# Patient Record
Sex: Male | Born: 1960 | Race: Asian | Hispanic: No | Marital: Married | Smoking: Former smoker
Health system: Southern US, Community
[De-identification: ages and names within clinical notes are randomized; demographics above are authoritative.]

## PROBLEM LIST (undated history)

## (undated) DIAGNOSIS — I10 Essential (primary) hypertension: Secondary | ICD-10-CM

## (undated) DIAGNOSIS — I610 Nontraumatic intracerebral hemorrhage in hemisphere, subcortical: Secondary | ICD-10-CM

## (undated) DIAGNOSIS — E119 Type 2 diabetes mellitus without complications: Secondary | ICD-10-CM

## (undated) HISTORY — PX: NECK SURGERY: SHX720

## (undated) HISTORY — DX: Nontraumatic intracerebral hemorrhage in hemisphere, subcortical: I61.0

## (undated) HISTORY — DX: Type 2 diabetes mellitus without complications: E11.9

---

## 2001-03-27 ENCOUNTER — Encounter: Payer: Self-pay | Admitting: Surgery

## 2001-03-27 ENCOUNTER — Encounter: Admission: RE | Admit: 2001-03-27 | Discharge: 2001-03-27 | Payer: Self-pay | Admitting: Surgery

## 2001-03-29 ENCOUNTER — Ambulatory Visit (HOSPITAL_BASED_OUTPATIENT_CLINIC_OR_DEPARTMENT_OTHER): Admission: RE | Admit: 2001-03-29 | Discharge: 2001-03-29 | Payer: Self-pay | Admitting: Surgery

## 2018-05-07 ENCOUNTER — Ambulatory Visit (HOSPITAL_COMMUNITY)
Admission: EM | Admit: 2018-05-07 | Discharge: 2018-05-07 | Disposition: A | Discharge status: Home / Self Care | Payer: PRIVATE HEALTH INSURANCE | Attending: Family Medicine | Admitting: Family Medicine

## 2018-05-07 ENCOUNTER — Encounter (HOSPITAL_COMMUNITY): Payer: Self-pay | Admitting: Emergency Medicine

## 2018-05-07 DIAGNOSIS — M7989 Other specified soft tissue disorders: Secondary | ICD-10-CM

## 2018-05-07 MED ORDER — DIPHENHYDRAMINE HCL 25 MG PO CAPS
ORAL_CAPSULE | ORAL | Status: AC
  Filled 2018-05-07: qty 4

## 2018-05-07 MED ORDER — DIPHENHYDRAMINE HCL 25 MG PO CAPS
50.0000 mg | ORAL_CAPSULE | Freq: Once | ORAL | Status: AC
  Administered 2018-05-07: 50 mg via ORAL

## 2018-05-07 NOTE — Discharge Instructions (Addendum)
Elevate hand until swelling has resolved Use ice pack every 3-4 hours as long as swollen

## 2018-05-07 NOTE — ED Provider Notes (Signed)
[facility]    CSN: [id number] Arrival date & time: 05/07/18  1839     History   Chief Complaint Chief Complaint  Patient presents with  . Hand Swelling    HPI Mark Bass is a 57 y.o. male.   Patient has swelling of the left hand since yesterday.  He thinks he slept on it.  No known stings or bites.  Denies joint symptoms but is unable to fully flex or extend due to swelling.  HPI  History reviewed. No pertinent past medical history.  There are no active problems to display for this patient.   History reviewed. No pertinent surgical history.     Home Medications    Prior to Admission medications   Not on File    Family History No family history on file.  Social History Social History   Tobacco Use  . Smoking status: Not on file  Substance Use Topics  . Alcohol use: Not on file  . Drug use: Not on file     Allergies   Patient has no known allergies.   Review of Systems Review of Systems  Constitutional: Negative.  Negative for chills and fever.  HENT: Negative for ear pain and sore throat.   Eyes: Negative for pain and visual disturbance.  Respiratory: Negative for cough and shortness of breath.   Cardiovascular: Negative for chest pain and palpitations.  Gastrointestinal: Negative for abdominal pain and vomiting.  Genitourinary: Negative for dysuria and hematuria.  Musculoskeletal: Positive for arthralgias. Negative for back pain.  Skin: Negative for color change and rash.  Neurological: Negative for seizures and syncope.  All other systems reviewed and are negative.    Physical Exam Triage Vital Signs ED Triage Vitals [05/07/18 1929]  Enc Vitals Group     BP (!) 197/109     Pulse Rate 72     Resp 16     Temp 98.2 F (36.8 C)     Temp src      SpO2 96 %     Weight      Height      Head Circumference      Peak Flow      Pain Score      Pain Loc      Pain Edu?      Excl. in GC?    No data found.  Updated Vital  Signs BP (!) 197/109   Pulse 72   Temp 98.2 F (36.8 C)   Resp 16   SpO2 96%   Visual Acuity Right Eye Distance:   Left Eye Distance:   Bilateral Distance:    Right Eye Near:   Left Eye Near:    Bilateral Near:     Physical Exam  Constitutional: He appears well-developed and well-nourished.  Cardiovascular: Normal rate and regular rhythm.  Pulmonary/Chest: Effort normal and breath sounds normal.  Musculoskeletal:  Left hand: 3+ soft tissue swelling.  There is no increase in temperature or erythema.  No localized joint swelling or tenderness but he has decreased flexion and extension due to swelling.     UC Treatments / Results  Labs (all labs ordered are listed, but only abnormal results are displayed) Labs Reviewed - No data to display  EKG None  Radiology No results found.  Procedures Procedures (including critical care time)  Medications Ordered in UC Medications - No data to display  Initial Impression / Assessment and Plan / UC Course  I have reviewed the triage vital  signs and the nursing notes.  Pertinent labs & imaging results that were available during my care of the patient were reviewed by me and considered in my medical decision making (see chart for details).     Left hand swelling.  Will use antihistamine along with elevation and ice Final Clinical Impressions(s) / UC Diagnoses   Final diagnoses:  None   Discharge Instructions   None    ED Prescriptions    None     Controlled Substance Prescriptions [place] Controlled Substance Registry consulted? No   Frederica Kuster, MD 05/07/18 [phone]

## 2018-12-31 ENCOUNTER — Other Ambulatory Visit: Payer: Self-pay

## 2018-12-31 ENCOUNTER — Encounter (HOSPITAL_COMMUNITY): Payer: Self-pay | Admitting: *Deleted

## 2018-12-31 ENCOUNTER — Inpatient Hospital Stay (HOSPITAL_COMMUNITY)
Admission: EM | Admit: 2018-12-31 | Discharge: 2019-01-03 | DRG: 871 | Disposition: A | Discharge status: Home / Self Care | Payer: 59 | Attending: Internal Medicine | Admitting: Internal Medicine

## 2018-12-31 ENCOUNTER — Emergency Department (HOSPITAL_COMMUNITY): Payer: 59

## 2018-12-31 DIAGNOSIS — E860 Dehydration: Secondary | ICD-10-CM | POA: Diagnosis present

## 2018-12-31 DIAGNOSIS — Z87891 Personal history of nicotine dependence: Secondary | ICD-10-CM

## 2018-12-31 DIAGNOSIS — J1282 Pneumonia due to coronavirus disease 2019: Secondary | ICD-10-CM

## 2018-12-31 DIAGNOSIS — A419 Sepsis, unspecified organism: Secondary | ICD-10-CM | POA: Diagnosis present

## 2018-12-31 DIAGNOSIS — A4189 Other specified sepsis: Secondary | ICD-10-CM | POA: Diagnosis not present

## 2018-12-31 DIAGNOSIS — J1289 Other viral pneumonia: Secondary | ICD-10-CM | POA: Diagnosis present

## 2018-12-31 DIAGNOSIS — U071 COVID-19: Secondary | ICD-10-CM | POA: Diagnosis present

## 2018-12-31 DIAGNOSIS — R630 Anorexia: Secondary | ICD-10-CM | POA: Diagnosis present

## 2018-12-31 DIAGNOSIS — R030 Elevated blood-pressure reading, without diagnosis of hypertension: Secondary | ICD-10-CM | POA: Diagnosis present

## 2018-12-31 LAB — CBC WITH DIFFERENTIAL/PLATELET

## 2018-12-31 NOTE — ED Triage Notes (Signed)
Pt speaks [place] as preferred language. Triage completed with assistance by his daughter whom he lives with. Pt has had fever, body aches and cough for about a week. Pt spouse who he has been taking care of is also being evaluated in the department with similar symptoms.   Pt daughter, Joice Lofts is her primary contact at [phone]

## 2018-12-31 NOTE — ED Notes (Signed)
[facility] notified that the patient is in room 3 for xray

## 2019-01-01 ENCOUNTER — Encounter (HOSPITAL_COMMUNITY): Payer: Self-pay | Admitting: Internal Medicine

## 2019-01-01 DIAGNOSIS — R030 Elevated blood-pressure reading, without diagnosis of hypertension: Secondary | ICD-10-CM | POA: Diagnosis not present

## 2019-01-01 DIAGNOSIS — A419 Sepsis, unspecified organism: Secondary | ICD-10-CM | POA: Diagnosis not present

## 2019-01-01 DIAGNOSIS — E860 Dehydration: Secondary | ICD-10-CM

## 2019-01-01 DIAGNOSIS — U071 COVID-19: Secondary | ICD-10-CM | POA: Diagnosis present

## 2019-01-01 DIAGNOSIS — R630 Anorexia: Secondary | ICD-10-CM | POA: Diagnosis not present

## 2019-01-01 DIAGNOSIS — A4189 Other specified sepsis: Secondary | ICD-10-CM | POA: Diagnosis not present

## 2019-01-01 DIAGNOSIS — J1289 Other viral pneumonia: Secondary | ICD-10-CM

## 2019-01-01 DIAGNOSIS — Z87891 Personal history of nicotine dependence: Secondary | ICD-10-CM | POA: Diagnosis not present

## 2019-01-01 DIAGNOSIS — J1282 Pneumonia due to coronavirus disease 2019: Secondary | ICD-10-CM | POA: Diagnosis present

## 2019-01-01 LAB — COMPREHENSIVE METABOLIC PANEL
ALT: 31 U/L (ref 0–44)
AST: 33 U/L (ref 15–41)
Albumin: 3.6 g/dL (ref 3.5–5.0)
Alkaline Phosphatase: 61 U/L (ref 38–126)
Anion gap: 10 (ref 5–15)
BUN: 22 mg/dL — ABNORMAL HIGH (ref 6–20)
CO2: 27 mmol/L (ref 22–32)
Calcium: 9 mg/dL (ref 8.9–10.3)
Chloride: 99 mmol/L (ref 98–111)
Creatinine, Ser: 1.18 mg/dL (ref 0.61–1.24)
GFR calc Af Amer: >60 mL/min (ref >60)
GFR calc non Af Amer: >60 mL/min (ref >60)
Glucose, Bld: 120 mg/dL — ABNORMAL HIGH (ref 70–99)
Potassium: 3.7 mmol/L (ref 3.5–5.1)
Sodium: 136 mmol/L (ref 135–145)
Total Bilirubin: 0.4 mg/dL (ref 0.3–1.2)
Total Protein: 8 g/dL (ref 6.5–8.1)

## 2019-01-01 LAB — D-DIMER, QUANTITATIVE: D-Dimer, Quant: 1.08 ug/mL-FEU — ABNORMAL HIGH (ref 0.00–0.50)

## 2019-01-01 LAB — CBC WITH DIFFERENTIAL/PLATELET
Abs Immature Granulocytes: 0.04 10*3/uL (ref 0.00–0.07)
Basophils Absolute: 0 10*3/uL (ref 0.0–0.1)
Basophils Relative: 0 %
Eosinophils Absolute: 0 10*3/uL (ref 0.0–0.5)
Eosinophils Relative: 0 %
HCT: 46 % (ref 39.0–52.0)
Hemoglobin: 14.3 g/dL (ref 13.0–17.0)
Immature Granulocytes: 0 %
Lymphocytes Relative: 15 %
Lymphs Abs: 1.6 10*3/uL (ref 0.7–4.0)
MCH: 20.8 pg — ABNORMAL LOW (ref 26.0–34.0)
MCHC: 31.1 g/dL (ref 30.0–36.0)
MCV: 67 fL — ABNORMAL LOW (ref 80.0–100.0)
Monocytes Absolute: 0.8 10*3/uL (ref 0.1–1.0)
Monocytes Relative: 8 %
Neutro Abs: 8.2 10*3/uL — ABNORMAL HIGH (ref 1.7–7.7)
Neutrophils Relative %: 77 %
Platelets: 183 10*3/uL (ref 150–400)
RBC: 6.87 MIL/uL — ABNORMAL HIGH (ref 4.22–5.81)
RDW: 17 % — ABNORMAL HIGH (ref 11.5–15.5)
WBC: 10.6 10*3/uL — ABNORMAL HIGH (ref 4.0–10.5)
nRBC: 0 % (ref 0.0–0.2)

## 2019-01-01 LAB — LACTATE DEHYDROGENASE: LDH: 222 U/L — ABNORMAL HIGH (ref 98–192)

## 2019-01-01 LAB — LACTIC ACID, PLASMA: Lactic Acid, Venous: 1 mmol/L (ref 0.5–1.9)

## 2019-01-01 LAB — FERRITIN: Ferritin: 668 ng/mL — ABNORMAL HIGH (ref 24–336)

## 2019-01-01 LAB — ABO/RH: ABO/RH(D): A POS

## 2019-01-01 LAB — PROCALCITONIN: Procalcitonin: <0.1 ng/mL

## 2019-01-01 LAB — SARS CORONAVIRUS 2 BY RT PCR (HOSPITAL ORDER, PERFORMED IN ~~LOC~~ HOSPITAL LAB): SARS Coronavirus 2: POSITIVE — AB

## 2019-01-01 LAB — C-REACTIVE PROTEIN: CRP: 5.3 mg/dL — ABNORMAL HIGH (ref <1.0)

## 2019-01-01 LAB — FIBRINOGEN: Fibrinogen: 580 mg/dL — ABNORMAL HIGH (ref 210–475)

## 2019-01-01 LAB — TRIGLYCERIDES: Triglycerides: 109 mg/dL (ref <150)

## 2019-01-01 MED ORDER — SODIUM CHLORIDE 0.9 % IV BOLUS
500.0000 mL | Freq: Once | INTRAVENOUS | Status: AC
  Administered 2019-01-01: 500 mL via INTRAVENOUS

## 2019-01-01 MED ORDER — SODIUM CHLORIDE 0.9 % IV SOLN
INTRAVENOUS | Status: DC
  Administered 2019-01-01: 11:00:00 via INTRAVENOUS

## 2019-01-01 MED ORDER — METHYLPREDNISOLONE SODIUM SUCC 40 MG IJ SOLR
40.0000 mg | Freq: Two times a day (BID) | INTRAMUSCULAR | Status: DC
  Administered 2019-01-01 – 2019-01-03 (×4): 40 mg via INTRAVENOUS
  Filled 2019-01-01 (×4): qty 1

## 2019-01-01 MED ORDER — GUAIFENESIN-DM 100-10 MG/5ML PO SYRP: 10.0000 mL | ORAL_SOLUTION | ORAL | Status: DC | PRN

## 2019-01-01 MED ORDER — ZINC SULFATE 220 (50 ZN) MG PO CAPS
220.0000 mg | ORAL_CAPSULE | Freq: Every day | ORAL | Status: DC
  Administered 2019-01-01 – 2019-01-03 (×3): 220 mg via ORAL
  Filled 2019-01-01 (×3): qty 1

## 2019-01-01 MED ORDER — ALBUTEROL SULFATE HFA 108 (90 BASE) MCG/ACT IN AERS
2.0000 | INHALATION_SPRAY | Freq: Four times a day (QID) | RESPIRATORY_TRACT | Status: DC
  Administered 2019-01-01 – 2019-01-02 (×3): 2 via RESPIRATORY_TRACT
  Filled 2019-01-01: qty 6.7

## 2019-01-01 MED ORDER — SODIUM CHLORIDE 0.9 % IV SOLN
500.0000 mg | INTRAVENOUS | Status: DC
  Administered 2019-01-01 – 2019-01-02 (×2): 500 mg via INTRAVENOUS
  Filled 2019-01-01 (×2): qty 500

## 2019-01-01 MED ORDER — HYDROCOD POLST-CPM POLST ER 10-8 MG/5ML PO SUER: 5.0000 mL | Freq: Two times a day (BID) | ORAL | Status: DC | PRN

## 2019-01-01 MED ORDER — TOCILIZUMAB 400 MG/20ML IV SOLN
8.0000 mg/kg | Freq: Once | INTRAVENOUS | Status: AC
  Administered 2019-01-01: 508 mg via INTRAVENOUS
  Filled 2019-01-01: qty 20

## 2019-01-01 MED ORDER — ONDANSETRON HCL 4 MG/2ML IJ SOLN: 4.0000 mg | Freq: Four times a day (QID) | INTRAMUSCULAR | Status: DC | PRN

## 2019-01-01 MED ORDER — ENOXAPARIN SODIUM 40 MG/0.4ML ~~LOC~~ SOLN
40.0000 mg | SUBCUTANEOUS | Status: DC
  Administered 2019-01-01 – 2019-01-02 (×2): 40 mg via SUBCUTANEOUS
  Filled 2019-01-01 (×2): qty 0.4

## 2019-01-01 MED ORDER — VITAMIN C 500 MG PO TABS
500.0000 mg | ORAL_TABLET | Freq: Every day | ORAL | Status: DC
  Administered 2019-01-02 – 2019-01-03 (×2): 500 mg via ORAL
  Filled 2019-01-01 (×2): qty 1

## 2019-01-01 MED ORDER — ACETAMINOPHEN 325 MG PO TABS: 650.0000 mg | ORAL_TABLET | Freq: Four times a day (QID) | ORAL | Status: DC | PRN

## 2019-01-01 MED ORDER — SODIUM CHLORIDE 0.9 % IV BOLUS
1000.0000 mL | Freq: Once | INTRAVENOUS | Status: AC
  Administered 2019-01-01: 1000 mL via INTRAVENOUS

## 2019-01-01 MED ORDER — SODIUM CHLORIDE 0.9 % IV SOLN
1.0000 g | INTRAVENOUS | Status: DC
  Administered 2019-01-01 – 2019-01-02 (×2): 1 g via INTRAVENOUS
  Filled 2019-01-01 (×2): qty 10

## 2019-01-01 MED ORDER — ONDANSETRON HCL 4 MG PO TABS: 4.0000 mg | ORAL_TABLET | Freq: Four times a day (QID) | ORAL | Status: DC | PRN

## 2019-01-01 NOTE — ED Notes (Signed)
Patient's wife is at [facility], room 129

## 2019-01-01 NOTE — ED Notes (Signed)
[facility] is here preparing the pt for transport to [facility] location.

## 2019-01-01 NOTE — Progress Notes (Signed)
Patient admitted earlier this morning by Dr. Katrinka Blazing  58 year old [place] speaking male admitted to the hospital with coronavirus.  Patient was interviewed through interpreter line.  Patient understands that he has been admitted to the hospital for coronavirus infection.  He has fever, infiltrate on chest x-ray, negative procalcitonin, elevated inflammatory markers.  Patient was explained the risks and benefits of Actemra as well as the experimental nature of this medication.  He is agreeable to receive 1 dose.  We will also start the patient on steroids.  Repeat labs in a.m.  He is otherwise stable at this time.  [employer]

## 2019-01-01 NOTE — ED Notes (Signed)
Lab called, pt is covid +

## 2019-01-01 NOTE — ED Provider Notes (Signed)
[name] EMERGENCY DEPARTMENT Provider Note   CSN: [id number] Arrival date & time: 12/31/18  2231    History   Chief Complaint Chief Complaint  Patient presents with  . Generalized Body Aches    HPI Mark Bass is a 58 y.o. male.     Patient presents to the emergency department with 1 week of fever, cough, body aches.  Patient's wife has similar symptoms (+ Covid-19 diagnosed during ED stay).  Patient describes generalized chest pain.  He also has sore throat.  No nausea, vomiting, or diarrhea.  No urinary symptoms.  No treatments prior to arrival.  Patient denies history of COPD, asthma, or other lung problems.  He denies smoking.  He feels mildly short of breath with activity.     History reviewed. No pertinent past medical history.  There are no active problems to display for this patient.   History reviewed. No pertinent surgical history.      Home Medications    Prior to Admission medications   Not on File    Family History No family history on file.  Social History Social History   Tobacco Use  . Smoking status: Not on file  Substance Use Topics  . Alcohol use: Not on file  . Drug use: Not on file     Allergies   Patient has no known allergies.   Review of Systems Review of Systems  Constitutional: Positive for chills, fatigue and fever.  HENT: Negative for rhinorrhea and sore throat.   Eyes: Negative for redness.  Respiratory: Positive for cough and shortness of breath.   Cardiovascular: Positive for chest pain.  Gastrointestinal: Negative for abdominal pain, diarrhea, nausea and vomiting.  Genitourinary: Negative for dysuria.  Musculoskeletal: Negative for myalgias.  Skin: Negative for rash.  Neurological: Negative for headaches.     Physical Exam Updated Vital Signs BP (!) 141/87   Pulse (!) 106   Temp 99.5 F (37.5 C) (Oral)   Resp (!) 24   Ht 5' (1.524 m)   Wt 63.5 kg   SpO2 96%   BMI 27.34 kg/m    Physical Exam Vitals signs and nursing note reviewed.  Constitutional:      Appearance: He is well-developed.  HENT:     Head: Normocephalic and atraumatic.  Eyes:     General:        Right eye: No discharge.        Left eye: No discharge.     Conjunctiva/sclera: Conjunctivae normal.  Neck:     Musculoskeletal: Normal range of motion and neck supple.  Cardiovascular:     Rate and Rhythm: Regular rhythm. Tachycardia present.     Heart sounds: Normal heart sounds.  Pulmonary:     Effort: Pulmonary effort is normal. Tachypnea present.     Breath sounds: Normal breath sounds.  Abdominal:     Palpations: Abdomen is soft.     Tenderness: There is no abdominal tenderness.  Skin:    General: Skin is warm and dry.  Neurological:     Mental Status: He is alert.      ED Treatments / Results  Labs (all labs ordered are listed, but only abnormal results are displayed) Labs Reviewed  SARS CORONAVIRUS 2 (HOSPITAL ORDER, PERFORMED IN [facility]) - Abnormal; Notable for the following components:      Result Value   SARS Coronavirus 2 POSITIVE (*)    All other components within normal limits  CBC WITH DIFFERENTIAL/PLATELET -  Abnormal; Notable for the following components:   WBC 10.6 (*)    RBC 6.87 (*)    MCV 67.0 (*)    MCH 20.8 (*)    RDW 17.0 (*)    Neutro Abs 8.2 (*)    All other components within normal limits  COMPREHENSIVE METABOLIC PANEL - Abnormal; Notable for the following components:   Glucose, Bld 120 (*)    BUN 22 (*)    All other components within normal limits  D-DIMER, QUANTITATIVE (NOT AT [facility]) - Abnormal; Notable for the following components:   D-Dimer, Quant 1.08 (*)    All other components within normal limits  LACTATE DEHYDROGENASE - Abnormal; Notable for the following components:   LDH 222 (*)    All other components within normal limits  FERRITIN - Abnormal; Notable for the following components:   Ferritin 668 (*)    All other components  within normal limits  FIBRINOGEN - Abnormal; Notable for the following components:   Fibrinogen 580 (*)    All other components within normal limits  C-REACTIVE PROTEIN - Abnormal; Notable for the following components:   CRP 5.3 (*)    All other components within normal limits  CULTURE, BLOOD (ROUTINE X 2)  CULTURE, BLOOD (ROUTINE X 2)  LACTIC ACID, PLASMA  PROCALCITONIN  TRIGLYCERIDES    ED ECG REPORT   Date: 01/01/2019  Rate: 111  Rhythm: sinus tachycardia  QRS Axis: normal  Intervals: normal  ST/T Wave abnormalities: normal  Conduction Disutrbances:none  Narrative Interpretation:   Old EKG Reviewed: none available  I have personally reviewed the EKG tracing and agree with the computerized printout as noted.   Radiology Dg Chest Port 1 View  Result Date: 12/31/2018 CLINICAL DATA:  Fever EXAM: PORTABLE CHEST 1 VIEW COMPARISON:  None. FINDINGS: There is patchy infiltrate in the right base. The lungs elsewhere are clear. Heart is borderline enlarged with pulmonary vascularity normal. No adenopathy. No bone lesions. IMPRESSION: Patchy infiltrate consistent with pneumonia right base. Lungs elsewhere clear. Heart borderline enlarged. Electronically Signed   By: Bretta BangWilliam  Woodruff III M.D.   On: 12/31/2018 23:33    Procedures Procedures (including critical care time)  Medications Ordered in ED Medications  sodium chloride 0.9 % bolus 500 mL (0 mLs Intravenous Stopped 01/01/19 0536)     Initial Impression / Assessment and Plan / ED Course  I have reviewed the triage vital signs and the nursing notes.  Pertinent labs & imaging results that were available during my care of the patient were reviewed by me and considered in my medical decision making (see chart for details).        Patient seen and examined.  Patient looks mildly uncomfortable.  We will give small fluid bolus, expand lab work-up.  Chest x-ray reviewed.  Vital signs reviewed and are as follows: BP (!)  141/87   Pulse (!) 106   Temp 99.5 F (37.5 C) (Oral)   Resp (!) 24   Ht 5' (1.524 m)   Wt 63.5 kg   SpO2 96%   BMI 27.34 kg/m   6:14 AM patient discussed with and seen by Dr. Manus Gunningancour.   Patient remains tachycardic, tachypneic.  Will discuss with hospitalist for admission.  Spoke with Dr. Toniann FailKakrakandy. AM hospitalist team will see.   BP (!) 149/90   Pulse (!) 113   Temp (S) 100.1 F (37.8 C)   Resp (!) 30   Ht 5' (1.524 m)   Wt 63.5 kg   SpO2  96%   BMI 27.34 kg/m   Final Clinical Impressions(s) / ED Diagnoses   Final diagnoses:  Pneumonia due to COVID-19 virus   Admit.   ED Discharge Orders    None       Renne Crigler, Cordelia Poche 01/01/19 0615    Glynn Octave, MD 01/01/19 2252    Glynn Octave, MD 01/01/19 2253

## 2019-01-01 NOTE — ED Notes (Signed)
Attempted to call report to [facility] location.

## 2019-01-01 NOTE — ED Notes (Signed)
S/w Thomas at [employer] [facility] for this pt to arrange transport to [facility]. States it will be a while due to all trucks being on a call.

## 2019-01-01 NOTE — ED Notes (Signed)
Translator used for [place] communication with pt.

## 2019-01-01 NOTE — H&P (Signed)
History and Physical    Mark Bass YKD:[id number] DOB: 03-17-1961 DOA: 12/31/2018  Referring MD/NP/PA: Midge Minium, MD PCP: Patient, No Pcp Per  Patient coming from: home  Chief Complaint: Fever  I have personally briefly reviewed patient's old medical records in [facility]   HPI: Mark Bass is a 58 y.o. [place] speaking male without known medical history or primary care provider; who presents with complaints of fever over the last week.  History is obtained mostly from the patient's daughter over the phone and in talking with the patient using the interpreter services. Have been using Tylenol for fever symptoms at home with temporary relief. Associated symptoms include complaints of chills, no appetite over the last 4 days, generalized malaise, body aches, nonproductive cough, and shortness of breath with activity.  Lack of appetite is related to a bitter taste in his mouth that is horrible.  Patient's wife recently tested positive for COVID 19.  Denied having any nausea, vomiting, abdominal pain, chest pain except for when coughing, diarrhea, or dysuria.  He had a remote history of smoking in his early 54s.    ED Course: Upon admission to the emergency department patient was noted to have a temperature up to 100.1 F, pulse 106-124, respiration 24-31, blood pressure 141/87 down to 163/94, O2 saturations maintained on room air.  Labs revealed WBC 10.6, BUN 22, creatinine 1.18, lactic acid 1, LDH 222, ferritin 668, CRP 5.3, procalcitonin<0.1, d-dimer 1.08, and fibrinogen 580.  Review of Systems  Constitutional: Positive for chills, fever and malaise/fatigue.  HENT: Negative for ear discharge and ear pain.   Eyes: Negative for photophobia.  Respiratory: Positive for cough and shortness of breath.   Cardiovascular: Positive for chest pain (With coughing). Negative for leg swelling.  Gastrointestinal: Negative for abdominal pain, diarrhea, nausea and vomiting.   Genitourinary: Negative for dysuria.  Musculoskeletal: Positive for myalgias. Negative for falls.  Skin: Negative for itching.  Neurological: Positive for weakness. Negative for focal weakness and loss of consciousness.  Endo/Heme/Allergies: Does not bruise/bleed easily.  Psychiatric/Behavioral: Negative for memory loss and substance abuse.    History reviewed. No pertinent past medical history.  History reviewed. No pertinent surgical history.   reports that he has quit smoking. He has never used smokeless tobacco. He reports previous alcohol use. He reports that he does not use drugs.  No Known Allergies  Family History  Family history unknown: Yes    Prior to Admission medications   Not on File    Physical Exam:  Constitutional: Older male who appears sick Vitals:   01/01/19 0530 01/01/19 0600 01/01/19 0630 01/01/19 0700  BP: (!) 146/90 (!) 149/90 (!) 151/96 (!) 147/89  Pulse: (!) 112 (!) 113 (!) 124 (!) 121  Resp: (!) 31 (!) 30 (!) 30 (!) 29  Temp:  (S) 100.1 F (37.8 C)    TempSrc:      SpO2: 94% 96% 95% 95%  Weight:      Height:       Eyes: PERRL, lids and conjunctivae normal ENMT: Mucous membranes are dry. Posterior pharynx clear of any exudate or lesions. Fair dentition.  Neck: normal, supple, no masses, no thyromegaly Respiratory: Tachypneic with mild rale appreciated in the right lower lung base.  Patient maintaining O2 saturations on room air currently. Cardiovascular: Tachycardic, no murmurs / rubs / gallops. No extremity edema. 2+ pedal pulses. No carotid bruits.  Abdomen: no tenderness, no masses palpated. No hepatosplenomegaly. Bowel sounds positive.  Musculoskeletal: no clubbing / cyanosis.  No joint deformity upper and lower extremities. Good ROM, no contractures. Normal muscle tone.  Skin: no rashes, lesions, ulcers. No induration Neurologic: CN 2-12 grossly intact. Sensation intact, DTR normal. Strength 5/5 in all 4.  Psychiatric: Normal judgment and  insight. Alert and oriented x 3. Normal mood.     Labs on Admission: I have personally reviewed following labs and imaging studies  CBC: Recent Labs  Lab 12/31/18 2324  WBC 10.6*  NEUTROABS 8.2*  HGB 14.3  HCT 46.0  MCV 67.0*  PLT 183   Basic Metabolic Panel: Recent Labs  Lab 12/31/18 2324  NA 136  K 3.7  CL 99  CO2 27  GLUCOSE 120*  BUN 22*  CREATININE 1.18  CALCIUM 9.0   GFR: Estimated Creatinine Clearance: 52.2 mL/min (by C-G formula based on SCr of 1.18 mg/dL). Liver Function Tests: Recent Labs  Lab 12/31/18 2324  AST 33  ALT 31  ALKPHOS 61  BILITOT 0.4  PROT 8.0  ALBUMIN 3.6   No results for input(s): LIPASE, AMYLASE in the last 168 hours. No results for input(s): AMMONIA in the last 168 hours. Coagulation Profile: No results for input(s): INR, PROTIME in the last 168 hours. Cardiac Enzymes: No results for input(s): CKTOTAL, CKMB, CKMBINDEX, TROPONINI in the last 168 hours. BNP (last 3 results) No results for input(s): PROBNP in the last 8760 hours. HbA1C: No results for input(s): HGBA1C in the last 72 hours. CBG: No results for input(s): GLUCAP in the last 168 hours. Lipid Profile: Recent Labs    01/01/19 0405  TRIG 109   Thyroid Function Tests: No results for input(s): TSH, T4TOTAL, FREET4, T3FREE, THYROIDAB in the last 72 hours. Anemia Panel: Recent Labs    01/01/19 0405  FERRITIN 668*   Urine analysis: No results found for: COLORURINE, APPEARANCEUR, LABSPEC, PHURINE, GLUCOSEU, HGBUR, BILIRUBINUR, KETONESUR, PROTEINUR, UROBILINOGEN, NITRITE, LEUKOCYTESUR Sepsis Labs: Recent Results (from the past 240 hour(s))  SARS Coronavirus 2 (CEPHEID- Performed in [facility] hospital lab), Hosp Order     Status: Abnormal   Collection Time: 12/31/18 11:04 PM  Result Value Ref Range Status   SARS Coronavirus 2 POSITIVE (A) NEGATIVE Final    Comment: RESULT CALLED TO, READ BACK BY AND VERIFIED WITH: Lily LovingsG ESPINOZA RN 01/01/19 0015 JDW (NOTE) If  result is NEGATIVE SARS-CoV-2 target nucleic acids are NOT DETECTED. The SARS-CoV-2 RNA is generally detectable in upper and lower  respiratory specimens during the acute phase of infection. The lowest  concentration of SARS-CoV-2 viral copies this assay can detect is 250  copies / mL. A negative result does not preclude SARS-CoV-2 infection  and should not be used as the sole basis for treatment or other  patient management decisions.  A negative result may occur with  improper specimen collection / handling, submission of specimen other  than nasopharyngeal swab, presence of viral mutation(s) within the  areas targeted by this assay, and inadequate number of viral copies  (<250 copies / mL). A negative result must be combined with clinical  observations, patient history, and epidemiological information. If result is POSITIVE SARS-CoV-2 target nucleic acids are DETECTED. The SA RS-CoV-2 RNA is generally detectable in upper and lower  respiratory specimens during the acute phase of infection.  Positive  results are indicative of active infection with SARS-CoV-2.  Clinical  correlation with patient history and other diagnostic information is  necessary to determine patient infection status.  Positive results do  not rule out bacterial infection or co-infection with other viruses.  If result is PRESUMPTIVE POSTIVE SARS-CoV-2 nucleic acids MAY BE PRESENT.   A presumptive positive result was obtained on the submitted specimen  and confirmed on repeat testing.  While 2019 novel coronavirus  (SARS-CoV-2) nucleic acids may be present in the submitted sample  additional confirmatory testing may be necessary for epidemiological  and / or clinical management purposes  to differentiate between  SARS-CoV-2 and other Sarbecovirus currently known to infect humans.  If clinically indicated additional testing with an alternate test  methodology [phone]) is advi sed. The SARS-CoV-2 RNA is generally   detectable in upper and lower respiratory specimens during the acute  phase of infection. The expected result is Negative. Fact Sheet for Patients:  BoilerBrush.com.cy Fact Sheet for Healthcare Providers: https://pope.com/ This test is not yet approved or cleared by the [place] FDA and has been authorized for detection and/or diagnosis of SARS-CoV-2 by FDA under an Emergency Use Authorization (EUA).  This EUA will remain in effect (meaning this test can be used) for the duration of the COVID-19 declaration under Section 564(b)(1) of the Act, 21 U.S.C. section 360bbb-3(b)(1), unless the authorization is terminated or revoked sooner. Performed at [facility], [address] [place], [place] [address]      Radiological Exams on Admission: Dg Chest Port 1 View  Result Date: 12/31/2018 CLINICAL DATA:  Fever EXAM: PORTABLE CHEST 1 VIEW COMPARISON:  None. FINDINGS: There is patchy infiltrate in the right base. The lungs elsewhere are clear. Heart is borderline enlarged with pulmonary vascularity normal. No adenopathy. No bone lesions. IMPRESSION: Patchy infiltrate consistent with pneumonia right base. Lungs elsewhere clear. Heart borderline enlarged. Electronically Signed   By: Bretta Bang III M.D.   On: 12/31/2018 23:33    EKG: Independently reviewed.  Sinus tachycardia 111 bpm  Assessment/Plan Sepsis secondary to pneumonia due to COVID-19 virus: Patient reported fever at home and upon arrival found to be tachycardic and tachypneic with elevated white blood cell count.  Chest x-ray showing a right sided pneumonia in the setting of being COVID+.  Inflammatory markers elevated including CRP, LDH, ferritin, and d-dimer.  Procalcitonin was<0.1 and lactic acid within normal limits.  Patient currently able to maintain O2 saturations on room air and therefore is low risk. -Admit to [facility] -COVID-19 order set utilized  -Follow-up blood and sputum cultures -Continuous pulse oximetry with nasal cannula oxygen as needed -Given empiric antibiotics Rocephin and azithromycin -Albuterol inhaler  -Monitor inflammatory markers  Dehydration: Acute.  Patient notes decreased oral intake over the last 4 days.  Elevated BUN to creatinine ratio consistent with dehydration..  Patient received 500 mL of fluid in the ED.  Of note heart was borderline in size. -Bolused 1 L normal saline IV fluids, and then placed on a rate of 75 mL/h  Elevated blood pressure: On admission blood pressures elevated to 163/94.  Patient not on any home pressure medications. -Continue to monitor for now  Lacks primary care  DVT prophylaxis: Lovenox Code Status: Full Family Communication: Discussed plan of care with the patient's daughter over the phone Disposition Plan: Possible discharge home in 1 to 2 days if clinically improving Consults called: None Admission status: Observation  Clydie Braun MD [facility] Pager [phone]   If 7PM-7AM, please contact night-coverage [email or web address] Password [facility]  01/01/2019, 7:15 AM

## 2019-01-01 NOTE — ED Notes (Addendum)
ED TO INPATIENT HANDOFF REPORT  ED Nurse Name and Phone #:  [phone] Reynolds BowlPaige  S Name/Age/Gender Princess BruinsYtunh Tokarz 58 y.o. male Room/Bed: 017C/017C  Code Status   Code Status: Not on file  Home/SNF/Other Home Patient oriented to: self, place, time and situation Is this baseline? Yes   Triage Complete: Triage complete  Chief Complaint cough fatigue fever  Triage Note Pt speaks [place] as preferred language. Triage completed with assistance by his daughter whom he lives with. Pt has had fever, body aches and cough for about a week. Pt spouse who he has been taking care of is also being evaluated in the department with similar symptoms.   Pt daughter, Joice Loftsmber is her primary contact at [phone]   Allergies No Known Allergies  Level of Care/Admitting Diagnosis ED Disposition    ED Disposition Condition Comment   Admit  The patient appears reasonably stabilized for admission considering the current resources, flow, and capabilities available in the ED at this time, and I doubt any other [facility] requiring further screening and/or treatment in the ED prior to admission is  present.       B Medical/Surgery History History reviewed. No pertinent past medical history. History reviewed. No pertinent surgical history.   A IV Location/Drains/Wounds Patient Lines/Drains/Airways Status   Active Line/Drains/Airways    Name:   Placement date:   Placement time:   Site:   Days:   Peripheral IV 01/01/19 Right Forearm   01/01/19    0424    Forearm   less than 1          Intake/Output Last 24 hours No intake or output data in the 24 hours ending 01/01/19 0620  Labs/Imaging Results for orders placed or performed during the hospital encounter of 12/31/18 (from the past 48 hour(s))  SARS Coronavirus 2 (CEPHEID- Performed in [facility] lab), Hosp Order     Status: Abnormal   Collection Time: 12/31/18 11:04 PM  Result Value Ref Range   SARS Coronavirus 2 POSITIVE (A) NEGATIVE     Comment: RESULT CALLED TO, READ BACK BY AND VERIFIED WITH: Lily LovingsG ESPINOZA RN 01/01/19 0015 JDW (NOTE) If result is NEGATIVE SARS-CoV-2 target nucleic acids are NOT DETECTED. The SARS-CoV-2 RNA is generally detectable in upper and lower  respiratory specimens during the acute phase of infection. The lowest  concentration of SARS-CoV-2 viral copies this assay can detect is 250  copies / mL. A negative result does not preclude SARS-CoV-2 infection  and should not be used as the sole basis for treatment or other  patient management decisions.  A negative result may occur with  improper specimen collection / handling, submission of specimen other  than nasopharyngeal swab, presence of viral mutation(s) within the  areas targeted by this assay, and inadequate number of viral copies  (<250 copies / mL). A negative result must be combined with clinical  observations, patient history, and epidemiological information. If result is POSITIVE SARS-CoV-2 target nucleic acids are DETECTED. The SA RS-CoV-2 RNA is generally detectable in upper and lower  respiratory specimens during the acute phase of infection.  Positive  results are indicative of active infection with SARS-CoV-2.  Clinical  correlation with patient history and other diagnostic information is  necessary to determine patient infection status.  Positive results do  not rule out bacterial infection or co-infection with other viruses. If result is PRESUMPTIVE POSTIVE SARS-CoV-2 nucleic acids MAY BE PRESENT.   A presumptive positive result was obtained on the submitted specimen  and confirmed on repeat testing.  While 2019 novel coronavirus  (SARS-CoV-2) nucleic acids may be present in the submitted sample  additional confirmatory testing may be necessary for epidemiological  and / or clinical management purposes  to differentiate between  SARS-CoV-2 and other Sarbecovirus currently known to infect humans.  If clinically indicated additional  testing with an alternate test  methodology [phone]) is advi sed. The SARS-CoV-2 RNA is generally  detectable in upper and lower respiratory specimens during the acute  phase of infection. The expected result is Negative. Fact Sheet for Patients:  BoilerBrush.com.cy Fact Sheet for Healthcare Providers: https://pope.com/ This test is not yet approved or cleared by the [place] FDA and has been authorized for detection and/or diagnosis of SARS-CoV-2 by FDA under an Emergency Use Authorization (EUA).  This EUA will remain in effect (meaning this test can be used) for the duration of the COVID-19 declaration under Section 564(b)(1) of the Act, 21 U.S.C. section 360bbb-3(b)(1), unless the authorization is terminated or revoked sooner. Performed at [facility], [address], [place], [place] [address]   CBC with Differential     Status: Abnormal   Collection Time: 12/31/18 11:24 PM  Result Value Ref Range   WBC 10.6 (H) 4.0 - 10.5 K/uL   RBC 6.87 (H) 4.22 - 5.81 MIL/uL   Hemoglobin 14.3 13.0 - 17.0 g/dL   HCT 81.1 91.4 - 78.2 %   MCV 67.0 (L) 80.0 - 100.0 fL   MCH 20.8 (L) 26.0 - 34.0 pg   MCHC 31.1 30.0 - 36.0 g/dL   RDW 95.6 (H) 21.3 - 08.6 %   Platelets 183 150 - 400 K/uL    Comment: REPEATED TO VERIFY   nRBC 0.0 0.0 - 0.2 %   Neutrophils Relative % 77 %   Neutro Abs 8.2 (H) 1.7 - 7.7 K/uL   Lymphocytes Relative 15 %   Lymphs Abs 1.6 0.7 - 4.0 K/uL   Monocytes Relative 8 %   Monocytes Absolute 0.8 0.1 - 1.0 K/uL   Eosinophils Relative 0 %   Eosinophils Absolute 0.0 0.0 - 0.5 K/uL   Basophils Relative 0 %   Basophils Absolute 0.0 0.0 - 0.1 K/uL   WBC Morphology MORPHOLOGY UNREMARKABLE    Immature Granulocytes 0 %   Abs Immature Granulocytes 0.04 0.00 - 0.07 K/uL    Comment: Performed at [facility], [address] [place], [place] [address]  Comprehensive metabolic panel     Status: Abnormal   Collection  Time: 12/31/18 11:24 PM  Result Value Ref Range   Sodium 136 135 - 145 mmol/L   Potassium 3.7 3.5 - 5.1 mmol/L   Chloride 99 98 - 111 mmol/L   CO2 27 22 - 32 mmol/L   Glucose, Bld 120 (H) 70 - 99 mg/dL   BUN 22 (H) 6 - 20 mg/dL   Creatinine, Ser 9.62 0.61 - 1.24 mg/dL   Calcium 9.0 8.9 - 95.2 mg/dL   Total Protein 8.0 6.5 - 8.1 g/dL   Albumin 3.6 3.5 - 5.0 g/dL   AST 33 15 - 41 U/L   ALT 31 0 - 44 U/L   Alkaline Phosphatase 61 38 - 126 U/L   Total Bilirubin 0.4 0.3 - 1.2 mg/dL   GFR calc non Af Amer >60 >60 mL/min   GFR calc Af Amer >60 >60 mL/min   Anion gap 10 5 - 15    Comment: Performed at [facility], [address], [place], [place] [address]  Lactic acid, plasma     Status: None   Collection Time: 12/31/18 11:25 PM  Result Value Ref Range   Lactic Acid, Venous 1.0 0.5 - 1.9 mmol/L    Comment: Performed at [facility], [address] [place], [place] [address]  D-dimer, quantitative     Status: Abnormal   Collection Time: 01/01/19  4:05 AM  Result Value Ref Range   D-Dimer, Quant 1.08 (H) 0.00 - 0.50 ug/mL-FEU    Comment: (NOTE) At the manufacturer cut-off of 0.50 ug/mL FEU, this assay has been documented to exclude PE with a sensitivity and negative predictive value of 97 to 99%.  At this time, this assay has not been approved by the FDA to exclude DVT/VTE. Results should be correlated with clinical presentation. Performed at [facility], [address], [place], [place] [address]   Procalcitonin     Status: None   Collection Time: 01/01/19  4:05 AM  Result Value Ref Range   Procalcitonin <0.10 ng/mL    Comment:        Interpretation: PCT (Procalcitonin) <= 0.5 ng/mL: Systemic infection (sepsis) is not likely. Local bacterial infection is possible. (NOTE)       Sepsis PCT Algorithm           Lower Respiratory Tract                                      Infection PCT Algorithm    ----------------------------      ----------------------------         PCT < 0.25 ng/mL                PCT < 0.10 ng/mL         Strongly encourage             Strongly discourage   discontinuation of antibiotics    initiation of antibiotics    ----------------------------     -----------------------------       PCT 0.25 - 0.50 ng/mL            PCT 0.10 - 0.25 ng/mL               OR       >80% decrease in PCT            Discourage initiation of                                            antibiotics      Encourage discontinuation           of antibiotics    ----------------------------     -----------------------------         PCT >= 0.50 ng/mL              PCT 0.26 - 0.50 ng/mL               AND        <80% decrease in PCT             Encourage initiation of  antibiotics       Encourage continuation           of antibiotics    ----------------------------     -----------------------------        PCT >= 0.50 ng/mL                  PCT > 0.50 ng/mL               AND         increase in PCT                  Strongly encourage                                      initiation of antibiotics    Strongly encourage escalation           of antibiotics                                     -----------------------------                                           PCT <= 0.25 ng/mL                                                 OR                                        > 80% decrease in PCT                                     Discontinue / Do not initiate                                             antibiotics Performed at [facility], [address] [place], [place] [address]   Lactate dehydrogenase     Status: Abnormal   Collection Time: 01/01/19  4:05 AM  Result Value Ref Range   LDH 222 (H) 98 - 192 U/L    Comment: Performed at [facility], [address] [place], [place] [address]  Ferritin     Status: Abnormal   Collection Time: 01/01/19  4:05 AM  Result Value  Ref Range   Ferritin 668 (H) 24 - 336 ng/mL    Comment: Performed at [facility], [address], [place], [place] [address]  Triglycerides     Status: None   Collection Time: 01/01/19  4:05 AM  Result Value Ref Range   Triglycerides 109 <150 mg/dL    Comment: Performed at [facility], [address], [place], [place] [address]  Fibrinogen     Status: Abnormal   Collection Time: 01/01/19  4:05 AM  Result Value  Ref Range   Fibrinogen 580 (H) 210 - 475 mg/dL    Comment: Performed at [facility], [address] [place], [place] [address]  C-reactive protein     Status: Abnormal   Collection Time: 01/01/19  4:05 AM  Result Value Ref Range   CRP 5.3 (H) <1.0 mg/dL    Comment: Performed at [facility], [address], [place], [place] [address]   Dg Chest Port 1 View  Result Date: 12/31/2018 CLINICAL DATA:  Fever EXAM: PORTABLE CHEST 1 VIEW COMPARISON:  None. FINDINGS: There is patchy infiltrate in the right base. The lungs elsewhere are clear. Heart is borderline enlarged with pulmonary vascularity normal. No adenopathy. No bone lesions. IMPRESSION: Patchy infiltrate consistent with pneumonia right base. Lungs elsewhere clear. Heart borderline enlarged. Electronically Signed   By: Bretta Bang III M.D.   On: 12/31/2018 23:33    Pending Labs Unresulted Labs (From admission, onward)    Start     Ordered   12/31/18 2253  Blood culture (routine x 2)  BLOOD CULTURE X 2,   STAT     12/31/18 2253          Vitals/Pain Today's Vitals   01/01/19 0500 01/01/19 0502 01/01/19 0530 01/01/19 0600  BP: (!) 163/94  (!) 146/90 (!) 149/90  Pulse: (!) 107  (!) 112 (!) 113  Resp: (!) 28  (!) 31 (!) 30  Temp:    (S) 100.1 F (37.8 C)  TempSrc:      SpO2: 96%  94% 96%  Weight:      Height:      PainSc:  Asleep      Isolation Precautions Droplet and Contact precautions  Medications Medications  sodium chloride 0.9 % bolus 500 mL (0 mLs Intravenous Stopped  01/01/19 0536)    Mobility walks Low fall risk   Focused Assessments Neuro Assessment Handoff:  Swallow screen pass? n/a         Neuro Assessment: Within Defined Limits Neuro Checks:      Last Documented NIHSS Modified Score:   Has TPA been given? No If patient is a Neuro Trauma and patient is going to OR before floor call report to 4N Charge nurse: [phone] or [phone]    Lung sounds:  clear, diminished O2 Device: Room Air        R Recommendations: See Admitting Provider Note  Report given to:   Additional Notes:  Patient's wife is at [facility], room 129

## 2019-01-01 NOTE — ED Notes (Signed)
S/w Doug at [employer] regarding transport for this pt. Gala Romney stated the covid trucks are both down and maintenance is [place] the base now seeing what they can do to get them running again. Anell Barr, Consulting civil engineer, made aware.

## 2019-01-01 NOTE — ED Notes (Signed)
Attempted to call report again to [facility] location for room [phone].

## 2019-01-02 DIAGNOSIS — J1289 Other viral pneumonia: Secondary | ICD-10-CM | POA: Diagnosis not present

## 2019-01-02 DIAGNOSIS — U071 COVID-19: Secondary | ICD-10-CM | POA: Diagnosis not present

## 2019-01-02 DIAGNOSIS — E860 Dehydration: Secondary | ICD-10-CM | POA: Diagnosis not present

## 2019-01-02 DIAGNOSIS — I10 Essential (primary) hypertension: Secondary | ICD-10-CM | POA: Diagnosis not present

## 2019-01-02 DIAGNOSIS — A419 Sepsis, unspecified organism: Secondary | ICD-10-CM | POA: Diagnosis not present

## 2019-01-02 DIAGNOSIS — Z87891 Personal history of nicotine dependence: Secondary | ICD-10-CM | POA: Diagnosis not present

## 2019-01-02 DIAGNOSIS — R630 Anorexia: Secondary | ICD-10-CM | POA: Diagnosis present

## 2019-01-02 DIAGNOSIS — R030 Elevated blood-pressure reading, without diagnosis of hypertension: Secondary | ICD-10-CM | POA: Diagnosis not present

## 2019-01-02 DIAGNOSIS — A4189 Other specified sepsis: Secondary | ICD-10-CM | POA: Diagnosis present

## 2019-01-02 LAB — COMPREHENSIVE METABOLIC PANEL
ALT: 30 U/L (ref 0–44)
AST: 33 U/L (ref 15–41)
Albumin: 3.1 g/dL — ABNORMAL LOW (ref 3.5–5.0)
Alkaline Phosphatase: 57 U/L (ref 38–126)
Anion gap: 11 (ref 5–15)
BUN: 14 mg/dL (ref 6–20)
CO2: 25 mmol/L (ref 22–32)
Calcium: 8.1 mg/dL — ABNORMAL LOW (ref 8.9–10.3)
Chloride: 104 mmol/L (ref 98–111)
Creatinine, Ser: 0.93 mg/dL (ref 0.61–1.24)
GFR calc Af Amer: >60 mL/min (ref >60)
GFR calc non Af Amer: >60 mL/min (ref >60)
Glucose, Bld: 182 mg/dL — ABNORMAL HIGH (ref 70–99)
Potassium: 3.6 mmol/L (ref 3.5–5.1)
Sodium: 140 mmol/L (ref 135–145)
Total Bilirubin: 0.2 mg/dL — ABNORMAL LOW (ref 0.3–1.2)
Total Protein: 7.5 g/dL (ref 6.5–8.1)

## 2019-01-02 LAB — CBC WITH DIFFERENTIAL/PLATELET
Abs Immature Granulocytes: 0.02 10*3/uL (ref 0.00–0.07)
Basophils Absolute: 0 10*3/uL (ref 0.0–0.1)
Basophils Relative: 0 %
Eosinophils Absolute: 0 10*3/uL (ref 0.0–0.5)
Eosinophils Relative: 0 %
HCT: 46.7 % (ref 39.0–52.0)
Hemoglobin: 14 g/dL (ref 13.0–17.0)
Immature Granulocytes: 0 %
Lymphocytes Relative: 19 %
Lymphs Abs: 0.8 10*3/uL (ref 0.7–4.0)
MCH: 20.5 pg — ABNORMAL LOW (ref 26.0–34.0)
MCHC: 30 g/dL (ref 30.0–36.0)
MCV: 68.5 fL — ABNORMAL LOW (ref 80.0–100.0)
Monocytes Absolute: 0.1 10*3/uL (ref 0.1–1.0)
Monocytes Relative: 2 %
Neutro Abs: 3.6 10*3/uL (ref 1.7–7.7)
Neutrophils Relative %: 79 %
Platelets: 176 10*3/uL (ref 150–400)
RBC: 6.82 MIL/uL — ABNORMAL HIGH (ref 4.22–5.81)
RDW: 17.5 % — ABNORMAL HIGH (ref 11.5–15.5)
WBC: 4.5 10*3/uL (ref 4.0–10.5)
nRBC: 0 % (ref 0.0–0.2)

## 2019-01-02 LAB — MAGNESIUM: Magnesium: 1.9 mg/dL (ref 1.7–2.4)

## 2019-01-02 LAB — PHOSPHORUS: Phosphorus: 3.2 mg/dL (ref 2.5–4.6)

## 2019-01-02 LAB — CK: Total CK: 127 U/L (ref 49–397)

## 2019-01-02 LAB — D-DIMER, QUANTITATIVE: D-Dimer, Quant: 0.92 ug/mL-FEU — ABNORMAL HIGH (ref 0.00–0.50)

## 2019-01-02 LAB — TRIGLYCERIDES: Triglycerides: 80 mg/dL (ref <150)

## 2019-01-02 LAB — C-REACTIVE PROTEIN: CRP: 8.4 mg/dL — ABNORMAL HIGH (ref <1.0)

## 2019-01-02 LAB — FERRITIN: Ferritin: 802 ng/mL — ABNORMAL HIGH (ref 24–336)

## 2019-01-02 MED ORDER — ALBUTEROL SULFATE HFA 108 (90 BASE) MCG/ACT IN AERS
2.0000 | INHALATION_SPRAY | Freq: Four times a day (QID) | RESPIRATORY_TRACT | Status: DC | PRN
  Filled 2019-01-02: qty 6.7

## 2019-01-02 MED ORDER — METOPROLOL TARTRATE 25 MG PO TABS
25.0000 mg | ORAL_TABLET | Freq: Two times a day (BID) | ORAL | Status: DC
  Administered 2019-01-02 – 2019-01-03 (×2): 25 mg via ORAL
  Filled 2019-01-02 (×2): qty 1

## 2019-01-02 NOTE — Progress Notes (Signed)
Offered to walk with pt. Pt declining at this time, states he will walk after lunch. Will continue to monitor.  Reynold Bowen, RN BSN 01/02/2019 11:40 AM

## 2019-01-02 NOTE — Progress Notes (Signed)
[facility] Department and AHF Team  ReDS Research Project   Patient #: [id number]  ReDS Measurement  Right: 28%  Left: 29%

## 2019-01-02 NOTE — Progress Notes (Signed)
PROGRESS NOTE    Mark Bass  WIO:[id number] DOB: June 22, 1961 DOA: 12/31/2018 PCP: Patient, No Pcp Per    Brief Narrative:  59 year old male with a history of hypertension, presents to the hospital with shortness of breath, fever and cough.  He was diagnosed with COVID-19 pneumonia.  He was found to have elevated inflammatory markers, negative procalcitonin.  He has been treated supportively.  He received a dose of Actemra on 5/26.  He is also on intravenous steroids.  Is slowly improving.   Assessment & Plan:   Principal Problem:   Pneumonia due to COVID-19 virus Active Problems:   Elevated blood pressure reading without diagnosis of hypertension   Dehydration   Sepsis (HCC)   1. Sepsis secondary to COVID-19 pneumonia.  Patient reported with fever, tachycardia, tachypnea and elevated WBC count.  Inflammatory markers were elevated.  He received IV fluids.  Procalcitonin was less than 0.1.  He was initially started on intravenous antibiotics, but with low procalcitonin normal lactic acid, will discontinue further antibiotics.  His inflammatory markers have mildly trended up today.  He received a dose of Actemra on 5/26 and was started on intravenous steroids.  Continue to follow inflammatory markers.  He is currently on room air, although staff reports that he has been lethargic today has poor p.o. intake and has had difficulty ambulating.  Continue current treatments and monitor.  If he starts to improve, anticipate discharge in the next 24 hours. 2. Dehydration.  Improving with IV fluids. 3. Hypertension.  Restart on metoprolol since he take this at home.  He also takes hydrochlorothiazide/lisinopril and amlodipine.  We will continue to hold these medications for now.  DVT prophylaxis: Lovenox Code Status: Full code Family Communication: None Disposition Plan: Discharge home once improved   Consultants:     Procedures:     Antimicrobials:   Ceftriaxone 5/26 >  Azithromycin  5/26 >   Subjective: Still feels very weak.  Feels short of breath when he moves around.  Does not have much appetite.  Objective: Vitals:   01/02/19 0848 01/02/19 1141 01/02/19 1449 01/02/19 1614  BP:  137/87    Pulse:  86 80   Resp:  18 18   Temp: 97.9 F (36.6 C) 97.9 F (36.6 C)  97.9 F (36.6 C)  TempSrc: Oral     SpO2:  94% 96%   Weight:      Height:        Intake/Output Summary (Last 24 hours) at 01/02/2019 1832 Last data filed at 01/02/2019 1547 Gross per 24 hour  Intake -  Output 1950 ml  Net -1950 ml   Filed Weights   01/01/19 0211  Weight: 63.5 kg    Examination:  General exam: Alert, awake, oriented x 3 Respiratory system: Clear to auscultation. Respiratory effort normal. Cardiovascular system:RRR. No murmurs, rubs, gallops. Gastrointestinal system: Abdomen is nondistended, soft and nontender. No organomegaly or masses felt. Normal bowel sounds heard. Central nervous system: Alert and oriented. No focal neurological deficits. Extremities: No C/C/E, +pedal pulses Skin: No rashes, lesions or ulcers Psychiatry: Judgement and insight appear normal. Mood & affect appropriate.    Data Reviewed: I have personally reviewed following labs and imaging studies  CBC: Recent Labs  Lab 12/31/18 2324 01/02/19 0423  WBC 10.6* 4.5  NEUTROABS 8.2* 3.6  HGB 14.3 14.0  HCT 46.0 46.7  MCV 67.0* 68.5*  PLT 183 176   Basic Metabolic Panel: Recent Labs  Lab 12/31/18 2324 01/02/19 0423  NA 136 140  K 3.7 3.6  CL 99 104  CO2 27 25  GLUCOSE 120* 182*  BUN 22* 14  CREATININE 1.18 0.93  CALCIUM 9.0 8.1*  MG  --  1.9  PHOS  --  3.2   GFR: Estimated Creatinine Clearance: 66.2 mL/min (by C-G formula based on SCr of 0.93 mg/dL). Liver Function Tests: Recent Labs  Lab 12/31/18 2324 01/02/19 0423  AST 33 33  ALT 31 30  ALKPHOS 61 57  BILITOT 0.4 0.2*  PROT 8.0 7.5  ALBUMIN 3.6 3.1*   No results for input(s): LIPASE, AMYLASE in the last 168 hours. No  results for input(s): AMMONIA in the last 168 hours. Coagulation Profile: No results for input(s): INR, PROTIME in the last 168 hours. Cardiac Enzymes: Recent Labs  Lab 01/02/19 0423  CKTOTAL 127   BNP (last 3 results) No results for input(s): PROBNP in the last 8760 hours. HbA1C: No results for input(s): HGBA1C in the last 72 hours. CBG: No results for input(s): GLUCAP in the last 168 hours. Lipid Profile: Recent Labs    01/01/19 0405 01/02/19 0423  TRIG 109 80   Thyroid Function Tests: No results for input(s): TSH, T4TOTAL, FREET4, T3FREE, THYROIDAB in the last 72 hours. Anemia Panel: Recent Labs    01/01/19 0405 01/02/19 0423  FERRITIN 668* 802*   Sepsis Labs: Recent Labs  Lab 12/31/18 2325 01/01/19 0405  PROCALCITON  --  <0.10  LATICACIDVEN 1.0  --     Recent Results (from the past 240 hour(s))  SARS Coronavirus 2 (CEPHEID- Performed in [facility] hospital lab), Hosp Order     Status: Abnormal   Collection Time: 12/31/18 11:04 PM  Result Value Ref Range Status   SARS Coronavirus 2 POSITIVE (A) NEGATIVE Final    Comment: RESULT CALLED TO, READ BACK BY AND VERIFIED WITH: Lily LovingsG ESPINOZA RN 01/01/19 0015 JDW (NOTE) If result is NEGATIVE SARS-CoV-2 target nucleic acids are NOT DETECTED. The SARS-CoV-2 RNA is generally detectable in upper and lower  respiratory specimens during the acute phase of infection. The lowest  concentration of SARS-CoV-2 viral copies this assay can detect is 250  copies / mL. A negative result does not preclude SARS-CoV-2 infection  and should not be used as the sole basis for treatment or other  patient management decisions.  A negative result may occur with  improper specimen collection / handling, submission of specimen other  than nasopharyngeal swab, presence of viral mutation(s) within the  areas targeted by this assay, and inadequate number of viral copies  (<250 copies / mL). A negative result must be combined with clinical   observations, patient history, and epidemiological information. If result is POSITIVE SARS-CoV-2 target nucleic acids are DETECTED. The SA RS-CoV-2 RNA is generally detectable in upper and lower  respiratory specimens during the acute phase of infection.  Positive  results are indicative of active infection with SARS-CoV-2.  Clinical  correlation with patient history and other diagnostic information is  necessary to determine patient infection status.  Positive results do  not rule out bacterial infection or co-infection with other viruses. If result is PRESUMPTIVE POSTIVE SARS-CoV-2 nucleic acids MAY BE PRESENT.   A presumptive positive result was obtained on the submitted specimen  and confirmed on repeat testing.  While 2019 novel coronavirus  (SARS-CoV-2) nucleic acids may be present in the submitted sample  additional confirmatory testing may be necessary for epidemiological  and / or clinical management purposes  to differentiate between  SARS-CoV-2 and other Sarbecovirus  currently known to infect humans.  If clinically indicated additional testing with an alternate test  methodology [phone]) is advi sed. The SARS-CoV-2 RNA is generally  detectable in upper and lower respiratory specimens during the acute  phase of infection. The expected result is Negative. Fact Sheet for Patients:  BoilerBrush.com.cy Fact Sheet for Healthcare Providers: https://pope.com/ This test is not yet approved or cleared by the [place] FDA and has been authorized for detection and/or diagnosis of SARS-CoV-2 by FDA under an Emergency Use Authorization (EUA).  This EUA will remain in effect (meaning this test can be used) for the duration of the COVID-19 declaration under Section 564(b)(1) of the Act, 21 U.S.C. section 360bbb-3(b)(1), unless the authorization is terminated or revoked sooner. Performed at [facility], [address]  [place], [place] [address]   Blood culture (routine x 2)     Status: None (Preliminary result)   Collection Time: 12/31/18 11:15 PM  Result Value Ref Range Status   Specimen Description BLOOD RIGHT ARM  Final   Special Requests   Final    BOTTLES DRAWN AEROBIC AND ANAEROBIC Blood Culture adequate volume   Culture   Final    NO GROWTH 1 DAY Performed at [facility], [address] [place], [place] [address]    Report Status PENDING  Incomplete  Blood culture (routine x 2)     Status: None (Preliminary result)   Collection Time: 12/31/18 11:30 PM  Result Value Ref Range Status   Specimen Description BLOOD RIGHT HAND  Final   Special Requests   Final    BOTTLES DRAWN AEROBIC ONLY Blood Culture adequate volume   Culture   Final    NO GROWTH 1 DAY Performed at [facility], [address], [place], [place] [address]    Report Status PENDING  Incomplete         Radiology Studies: Dg Chest Port 1 View  Result Date: 12/31/2018 CLINICAL DATA:  Fever EXAM: PORTABLE CHEST 1 VIEW COMPARISON:  None. FINDINGS: There is patchy infiltrate in the right base. The lungs elsewhere are clear. Heart is borderline enlarged with pulmonary vascularity normal. No adenopathy. No bone lesions. IMPRESSION: Patchy infiltrate consistent with pneumonia right base. Lungs elsewhere clear. Heart borderline enlarged. Electronically Signed   By: Bretta Bang III M.D.   On: 12/31/2018 23:33        Scheduled Meds: . enoxaparin (LOVENOX) injection  40 mg Subcutaneous Q24H  . methylPREDNISolone (SOLU-MEDROL) injection  40 mg Intravenous Q12H  . vitamin C  500 mg Oral Daily  . zinc sulfate  220 mg Oral Daily   Continuous Infusions: . sodium chloride 75 mL/hr at 01/02/19 1547  . azithromycin 500 mg (01/02/19 0810)  . cefTRIAXone (ROCEPHIN)  IV 1 g (01/02/19 0922)     LOS: 0 days    Time spent:    Erick Blinks, MD [facility]   If 7PM-7AM, please contact  night-coverage [email or web address]  01/02/2019, 6:32 PM

## 2019-01-03 ENCOUNTER — Telehealth: Payer: Self-pay

## 2019-01-03 DIAGNOSIS — I10 Essential (primary) hypertension: Secondary | ICD-10-CM

## 2019-01-03 LAB — INTERLEUKIN-6, PLASMA: Interleukin-6, Plasma: 48.8 pg/mL — ABNORMAL HIGH (ref 0.0–12.2)

## 2019-01-03 LAB — COMPREHENSIVE METABOLIC PANEL
ALT: 29 U/L (ref 0–44)
AST: 26 U/L (ref 15–41)
Albumin: 2.9 g/dL — ABNORMAL LOW (ref 3.5–5.0)
Alkaline Phosphatase: 51 U/L (ref 38–126)
Anion gap: 11 (ref 5–15)
BUN: 19 mg/dL (ref 6–20)
CO2: 22 mmol/L (ref 22–32)
Calcium: 8.6 mg/dL — ABNORMAL LOW (ref 8.9–10.3)
Chloride: 111 mmol/L (ref 98–111)
Creatinine, Ser: 0.75 mg/dL (ref 0.61–1.24)
GFR calc Af Amer: >60 mL/min (ref >60)
GFR calc non Af Amer: >60 mL/min (ref >60)
Glucose, Bld: 201 mg/dL — ABNORMAL HIGH (ref 70–99)
Potassium: 3.9 mmol/L (ref 3.5–5.1)
Sodium: 144 mmol/L (ref 135–145)
Total Bilirubin: 0.2 mg/dL — ABNORMAL LOW (ref 0.3–1.2)
Total Protein: 7.2 g/dL (ref 6.5–8.1)

## 2019-01-03 LAB — CBC WITH DIFFERENTIAL/PLATELET
Abs Immature Granulocytes: 0.03 10*3/uL (ref 0.00–0.07)
Basophils Absolute: 0 10*3/uL (ref 0.0–0.1)
Basophils Relative: 0 %
Eosinophils Absolute: 0.2 10*3/uL (ref 0.0–0.5)
Eosinophils Relative: 2 %
HCT: 46.6 % (ref 39.0–52.0)
Hemoglobin: 14.6 g/dL (ref 13.0–17.0)
Immature Granulocytes: 0 %
Lymphocytes Relative: 16 %
Lymphs Abs: 1.3 10*3/uL (ref 0.7–4.0)
MCH: 21.1 pg — ABNORMAL LOW (ref 26.0–34.0)
MCHC: 31.3 g/dL (ref 30.0–36.0)
MCV: 67.3 fL — ABNORMAL LOW (ref 80.0–100.0)
Monocytes Absolute: 0.5 10*3/uL (ref 0.1–1.0)
Monocytes Relative: 6 %
Neutro Abs: 6.1 10*3/uL (ref 1.7–7.7)
Neutrophils Relative %: 76 %
Platelets: 191 10*3/uL (ref 150–400)
RBC: 6.92 MIL/uL — ABNORMAL HIGH (ref 4.22–5.81)
RDW: 17.4 % — ABNORMAL HIGH (ref 11.5–15.5)
WBC: 8.1 10*3/uL (ref 4.0–10.5)
nRBC: 0 % (ref 0.0–0.2)

## 2019-01-03 LAB — CK: Total CK: 130 U/L (ref 49–397)

## 2019-01-03 LAB — C-REACTIVE PROTEIN: CRP: 3.1 mg/dL — ABNORMAL HIGH (ref <1.0)

## 2019-01-03 LAB — FERRITIN: Ferritin: 736 ng/mL — ABNORMAL HIGH (ref 24–336)

## 2019-01-03 LAB — PHOSPHORUS: Phosphorus: 2.6 mg/dL (ref 2.5–4.6)

## 2019-01-03 LAB — D-DIMER, QUANTITATIVE: D-Dimer, Quant: 0.99 ug/mL-FEU — ABNORMAL HIGH (ref 0.00–0.50)

## 2019-01-03 LAB — MAGNESIUM: Magnesium: 1.8 mg/dL (ref 1.7–2.4)

## 2019-01-03 LAB — TRIGLYCERIDES: Triglycerides: 98 mg/dL (ref <150)

## 2019-01-03 MED ORDER — ZINC SULFATE 220 (50 ZN) MG PO CAPS: 220.0000 mg | ORAL_CAPSULE | Freq: Every day | ORAL | 0 refills | Status: DC

## 2019-01-03 MED ORDER — ASCORBIC ACID 500 MG PO TABS: 500.0000 mg | ORAL_TABLET | Freq: Every day | ORAL | 0 refills | Status: DC

## 2019-01-03 MED ORDER — PREDNISONE 10 MG PO TABS: ORAL_TABLET | ORAL | 0 refills | Status: DC

## 2019-01-03 MED ORDER — BENZONATATE 100 MG PO CAPS: 100.0000 mg | ORAL_CAPSULE | Freq: Four times a day (QID) | ORAL | 0 refills | Status: DC | PRN

## 2019-01-03 MED ORDER — BENZONATATE 100 MG PO CAPS: 100.0000 mg | ORAL_CAPSULE | Freq: Four times a day (QID) | ORAL | 0 refills | Status: AC | PRN

## 2019-01-03 MED ORDER — ALBUTEROL SULFATE HFA 108 (90 BASE) MCG/ACT IN AERS: 2.0000 | INHALATION_SPRAY | Freq: Four times a day (QID) | RESPIRATORY_TRACT | 0 refills | Status: DC | PRN

## 2019-01-03 NOTE — Progress Notes (Signed)
Pt's daughter Joice Lofts [phone] was called to  Give appointment for pt. Virtual appointment with [facility] 6/5 at 1430.

## 2019-01-03 NOTE — Discharge Summary (Signed)
PATIENT DETAILS Name: Mark Bass Age: 58 y.o. Sex: male Date of Birth: [birth year] MRN: [id number]. Admitting Physician: Clydie Braun, MD NFA:[id number], No Pcp Per  Admit Date: 12/31/2018 Discharge date: 01/03/2019  Recommendations for Outpatient Follow-up:  1. Follow up with PCP in 1-2 weeks 2. Please obtain BMP/CBC in one week   Admitted From:  Home  Disposition: Home    Home Health: No  Equipment/Devices: None  Discharge Condition: Stable  CODE STATUS: FULL CODE  Diet recommendation:  Heart Healthy  Brief Summary: See H&P, Labs, Consult and Test reports for all details in brief, patient is a 58 year old male with history of hypertension-who presented with fever and cough-was found to have COVID-19 viral pneumonia.  Improved rapidly after 1 dose of Actemra-by day of discharge afebrile-not requiring oxygen and clinically improved compared to on admission.  See below for further details  Brief Hospital Course: COVID-19 viral pneumonia: Do not think patient had sepsis pathophysiology-patient's procalcitonin was essentially normal.  Due to elevated inflammatory markers, patient received 1 dose of Actemra and was started on IV steroids.  With these measures, patient rapidly improved-he is now afebrile-and feels much better and is actually requesting discharge home.  He has ambulated in the room without any major issues.  Since he feels much better-we will discharge him home today-he will be on tapering prednisone and other supportive care.   Dehydration: Resolved with IV fluids  Hypertension: Resume metoprolol and amlodipine-follow-up with PCP before resumption of HCTZ/lisinopril.  Procedures/Studies: None  Discharge Diagnoses:  Principal Problem:   Pneumonia due to COVID-19 virus Active Problems:   Elevated blood pressure reading without diagnosis of hypertension   Dehydration   Sepsis (HCC)   COVID-19 virus infection   Discharge Instructions:  Activity:   As tolerated   Discharge Instructions    Diet - low sodium heart healthy   Complete by:  As directed    Discharge instructions   Complete by:  As directed    Follow with Primary MD  in 1 week  Your blood pressure medications-amlodipine and metoprolol on discharge-follow-up with your primary care practitioner before you resume HCTZ/lisinopril.  Please get a complete blood count and chemistry panel checked by your Primary MD at your next visit, and again as instructed by your Primary MD.  Get Medicines reviewed and adjusted: Please take all your medications with you for your next visit with your Primary MD  Laboratory/radiological data: Please request your Primary MD to go over all hospital tests and procedure/radiological results at the follow up, please ask your Primary MD to get all Hospital records sent to his/her office.  In some cases, they will be blood work, cultures and biopsy results pending at the time of your discharge. Please request that your primary care M.D. follows up on these results.  Also Note the following: If you experience worsening of your admission symptoms, develop shortness of breath, life threatening emergency, suicidal or homicidal thoughts you must seek medical attention immediately by calling 911 or calling your MD immediately  if symptoms less severe.  You must read complete instructions/literature along with all the possible adverse reactions/side effects for all the Medicines you take and that have been prescribed to you. Take any new Medicines after you have completely understood and accpet all the possible adverse reactions/side effects.   Do not drive when taking Pain medications or sleeping medications (Benzodaizepines)  Do not take more than prescribed Pain, Sleep and Anxiety Medications. It is not advisable to combine anxiety,sleep  and pain medications without talking with your primary care practitioner  Special Instructions: If you have smoked or  chewed Tobacco  in the last 2 yrs please stop smoking, stop any regular Alcohol  and or any Recreational drug use.  Wear Seat belts while driving.  Please note: You were cared for by a hospitalist during your hospital stay. Once you are discharged, your primary care physician will handle any further medical issues. Please note that NO REFILLS for any discharge medications will be authorized once you are discharged, as it is imperative that you return to your primary care physician (or establish a relationship with a primary care physician if you do not have one) for your post hospital discharge needs so that they can reassess your need for medications and monitor your lab values.  ?   Person Under Monitoring Name: Mark Bass  Location: [address] [place] [address]   Infection Prevention Recommendations for Individuals Confirmed to have, or Being Evaluated for, 2019 Novel Coronavirus (COVID-19) Infection Who Receive Care at Home  Individuals who are confirmed to have, or are being evaluated for, COVID-19 should follow the prevention steps below until a healthcare provider or local or state health department says they can return to normal activities.  Stay home except to get medical care You should restrict activities outside your home, except for getting medical care. Do not go to work, school, or public areas, and do not use public transportation or taxis.  Call ahead before visiting your doctor Before your medical appointment, call the healthcare provider and tell them that you have, or are being evaluated for, COVID-19 infection. This will help the healthcare provider's office take steps to keep other people from getting infected. Ask your healthcare provider to call the local or state health department.  Monitor your symptoms Seek prompt medical attention if your illness is worsening (e.g., difficulty breathing). Before going to your medical appointment, call the  healthcare provider and tell them that you have, or are being evaluated for, COVID-19 infection. Ask your healthcare provider to call the local or state health department.  Wear a facemask You should wear a facemask that covers your nose and mouth when you are in the same room with other people and when you visit a healthcare provider. People who live with or visit you should also wear a facemask while they are in the same room with you.  Separate yourself from other people in your home As much as possible, you should stay in a different room from other people in your home. Also, you should use a separate bathroom, if available.  Avoid sharing household items You should not share dishes, drinking glasses, cups, eating utensils, towels, bedding, or other items with other people in your home. After using these items, you should wash them thoroughly with soap and water.  Cover your coughs and sneezes Cover your mouth and nose with a tissue when you cough or sneeze, or you can cough or sneeze into your sleeve. Throw used tissues in a lined trash can, and immediately wash your hands with soap and water for at least 20 seconds or use an alcohol-based hand rub.  Wash your [employer] your hands often and thoroughly with soap and water for at least 20 seconds. You can use an alcohol-based hand sanitizer if soap and water are not available and if your hands are not visibly dirty. Avoid touching your eyes, nose, and mouth with unwashed hands.   Prevention Steps for  Caregivers and Household Members of Individuals Confirmed to have, or Being Evaluated for, COVID-19 Infection Being Cared for in the Home  If you live with, or provide care at home for, a person confirmed to have, or being evaluated for, COVID-19 infection please follow these guidelines to prevent infection:  Follow healthcare provider's instructions Make sure that you understand and can help the patient follow any healthcare  provider instructions for all care.  Provide for the patient's basic needs You should help the patient with basic needs in the home and provide support for getting groceries, prescriptions, and other personal needs.  Monitor the patient's symptoms If they are getting sicker, call his or her medical provider and tell them that the patient has, or is being evaluated for, COVID-19 infection. This will help the healthcare provider's office take steps to keep other people from getting infected. Ask the healthcare provider to call the local or state health department.  Limit the number of people who have contact with the patient If possible, have only one caregiver for the patient. Other household members should stay in another home or place of residence. If this is not possible, they should stay in another room, or be separated from the patient as much as possible. Use a separate bathroom, if available. Restrict visitors who do not have an essential need to be in the home.  Keep older adults, very young children, and other sick people away from the patient Keep older adults, very young children, and those who have compromised immune systems or chronic health conditions away from the patient. This includes people with chronic heart, lung, or kidney conditions, diabetes, and cancer.  Ensure good ventilation Make sure that shared spaces in the home have good air flow, such as from an air conditioner or an opened window, weather permitting.  Wash your hands often Wash your hands often and thoroughly with soap and water for at least 20 seconds. You can use an alcohol based hand sanitizer if soap and water are not available and if your hands are not visibly dirty. Avoid touching your eyes, nose, and mouth with unwashed hands. Use disposable paper towels to dry your hands. If not available, use dedicated cloth towels and replace them when they become wet.  Wear a facemask and gloves Wear a  disposable facemask at all times in the room and gloves when you touch or have contact with the patient's blood, body fluids, and/or secretions or excretions, such as sweat, saliva, sputum, nasal mucus, vomit, urine, or feces.  Ensure the mask fits over your nose and mouth tightly, and do not touch it during use. Throw out disposable facemasks and gloves after using them. Do not reuse. Wash your hands immediately after removing your facemask and gloves. If your personal clothing becomes contaminated, carefully remove clothing and launder. Wash your hands after handling contaminated clothing. Place all used disposable facemasks, gloves, and other waste in a lined container before disposing them with other household waste. Remove gloves and wash your hands immediately after handling these items.  Do not share dishes, glasses, or other household items with the patient Avoid sharing household items. You should not share dishes, drinking glasses, cups, eating utensils, towels, bedding, or other items with a patient who is confirmed to have, or being evaluated for, COVID-19 infection. After the person uses these items, you should wash them thoroughly with soap and water.  Wash laundry thoroughly Immediately remove and wash clothes or bedding that have blood, body fluids, and/or  secretions or excretions, such as sweat, saliva, sputum, nasal mucus, vomit, urine, or feces, on them. Wear gloves when handling laundry from the patient. Read and follow directions on labels of laundry or clothing items and detergent. In general, wash and dry with the warmest temperatures recommended on the label.  Clean all areas the individual has used often Clean all touchable surfaces, such as counters, tabletops, doorknobs, bathroom fixtures, toilets, phones, keyboards, tablets, and bedside tables, every day. Also, clean any surfaces that may have blood, body fluids, and/or secretions or excretions on them. Wear gloves when  cleaning surfaces the patient has come in contact with. Use a diluted bleach solution (e.g., dilute bleach with 1 part bleach and 10 parts water) or a household disinfectant with a label that says EPA-registered for coronaviruses. To make a bleach solution at home, add 1 tablespoon of bleach to 1 quart (4 cups) of water. For a larger supply, add  cup of bleach to 1 gallon (16 cups) of water. Read labels of cleaning products and follow recommendations provided on product labels. Labels contain instructions for safe and effective use of the cleaning product including precautions you should take when applying the product, such as wearing gloves or eye protection and making sure you have good ventilation during use of the product. Remove gloves and wash hands immediately after cleaning.  Monitor yourself for signs and symptoms of illness Caregivers and household members are considered close contacts, should monitor their health, and will be asked to limit movement outside of the home to the extent possible. Follow the monitoring steps for close contacts listed on the symptom monitoring form.   ? If you have additional questions, contact your local health department or call the epidemiologist on call at [phone] (available 24/7). ? This guidance is subject to change. For the most up-to-date guidance from [facility], please refer to their website: [email or web address].hu   Increase activity slowly   Complete by:  As directed    MyChart COVID-19 home monitoring program   Complete by:  Jan 03, 2019    Is the patient willing to use the MyChart Mobile App for home monitoring?:  Yes   Temperature monitoring   Complete by:  Jan 03, 2019    After how many days would you like to receive a notification of this patient's flowsheet entries?:  1     Allergies as of 01/03/2019   No Known Allergies     Medication List    STOP taking these medications    azithromycin 250 MG tablet Commonly known as:  ZITHROMAX   lisinopril-hydrochlorothiazide 20-12.5 MG tablet Commonly known as:  ZESTORETIC     TAKE these medications   albuterol 108 (90 Base) MCG/ACT inhaler Commonly known as:  VENTOLIN HFA Inhale 2 puffs into the lungs every 6 (six) hours as needed for wheezing or shortness of breath.   amLODipine 10 MG tablet Commonly known as:  NORVASC Take 10 mg by mouth daily.   ascorbic acid 500 MG tablet Commonly known as:  VITAMIN C Take 1 tablet (500 mg total) by mouth daily.   benzonatate 100 MG capsule Commonly known as:  Tessalon Perles Take 1 capsule (100 mg total) by mouth every 6 (six) hours as needed for cough.   metoprolol tartrate 50 MG tablet Commonly known as:  LOPRESSOR Take 50 mg by mouth daily.   predniSONE 10 MG tablet Commonly known as:  DELTASONE Take 40 mg daily for 1 day, 30 mg daily for 1 day, 20  mg daily for 1 day,10 mg daily for 1 days, then stop   zinc sulfate 220 (50 Zn) MG capsule Take 1 capsule (220 mg total) by mouth daily.      Follow-up Information    Primary care MD. Schedule an appointment as soon as possible for a visit in 1 week(s).          No Known Allergies  Consultations:   None   Other Procedures/Studies: Dg Chest Port 1 View  Result Date: 12/31/2018 CLINICAL DATA:  Fever EXAM: PORTABLE CHEST 1 VIEW COMPARISON:  None. FINDINGS: There is patchy infiltrate in the right base. The lungs elsewhere are clear. Heart is borderline enlarged with pulmonary vascularity normal. No adenopathy. No bone lesions. IMPRESSION: Patchy infiltrate consistent with pneumonia right base. Lungs elsewhere clear. Heart borderline enlarged. Electronically Signed   By: Bretta BangWilliam  Woodruff III M.D.   On: 12/31/2018 23:33     TODAY-DAY OF DISCHARGE:  Subjective:   Mark Bass today has no headache,no chest abdominal pain,no new weakness tingling or numbness, feels much better wants to go home today.    Objective:   Blood pressure 139/84, pulse 72, temperature 98.1 F (36.7 C), temperature source Oral, resp. rate 18, height 5' (1.524 m), weight 63.5 kg, SpO2 92 %.  Intake/Output Summary (Last 24 hours) at 01/03/2019 1014 Last data filed at 01/03/2019 [id number] Gross per 24 hour  Intake 120 ml  Output 2225 ml  Net -2105 ml   Filed Weights   01/01/19 0211  Weight: 63.5 kg    Exam: Awake Alert, Oriented *3, No new F.N deficits, Normal affect [place].AT,PERRAL Supple Neck,No JVD, No cervical lymphadenopathy appriciated.  Symmetrical Chest wall movement, Good air movement bilaterally, CTAB RRR,No Gallops,Rubs or new Murmurs, No Parasternal Heave +ve B.Sounds, Abd Soft, Non tender, No organomegaly appriciated, No rebound -guarding or rigidity. No Cyanosis, Clubbing or edema, No new Rash or bruise   PERTINENT RADIOLOGIC STUDIES: Dg Chest Port 1 View  Result Date: 12/31/2018 CLINICAL DATA:  Fever EXAM: PORTABLE CHEST 1 VIEW COMPARISON:  None. FINDINGS: There is patchy infiltrate in the right base. The lungs elsewhere are clear. Heart is borderline enlarged with pulmonary vascularity normal. No adenopathy. No bone lesions. IMPRESSION: Patchy infiltrate consistent with pneumonia right base. Lungs elsewhere clear. Heart borderline enlarged. Electronically Signed   By: Bretta BangWilliam  Woodruff III M.D.   On: 12/31/2018 23:33     PERTINENT LAB RESULTS: CBC: Recent Labs    01/02/19 0423 01/03/19 0518  WBC 4.5 8.1  HGB 14.0 14.6  HCT 46.7 46.6  PLT 176 191   CMET CMP     Component Value Date/Time   NA 144 01/03/2019 0518   K 3.9 01/03/2019 0518   CL 111 01/03/2019 0518   CO2 22 01/03/2019 0518   GLUCOSE 201 (H) 01/03/2019 0518   BUN 19 01/03/2019 0518   CREATININE 0.75 01/03/2019 0518   CALCIUM 8.6 (L) 01/03/2019 0518   PROT 7.2 01/03/2019 0518   ALBUMIN 2.9 (L) 01/03/2019 0518   AST 26 01/03/2019 0518   ALT 29 01/03/2019 0518   ALKPHOS 51 01/03/2019 0518   BILITOT 0.2 (L) 01/03/2019  0518   GFRNONAA >60 01/03/2019 0518   GFRAA >60 01/03/2019 0518    GFR Estimated Creatinine Clearance: 76.9 mL/min (by C-G formula based on SCr of 0.75 mg/dL). No results for input(s): LIPASE, AMYLASE in the last 72 hours. Recent Labs    01/02/19 0423 01/03/19 0518  CKTOTAL 127 130   Invalid input(s): POCBNP  Recent Labs    01/02/19 0423 01/03/19 0518  DDIMER 0.92* 0.99*   No results for input(s): HGBA1C in the last 72 hours. Recent Labs    01/02/19 0423 01/03/19 0518  TRIG 80 98   No results for input(s): TSH, T4TOTAL, T3FREE, THYROIDAB in the last 72 hours.  Invalid input(s): FREET3 Recent Labs    01/02/19 0423 01/03/19 0523  FERRITIN 802* 736*   Coags: No results for input(s): INR in the last 72 hours.  Invalid input(s): PT Microbiology: Recent Results (from the past 240 hour(s))  SARS Coronavirus 2 (CEPHEID- Performed in [facility] hospital lab), Hosp Order     Status: Abnormal   Collection Time: 12/31/18 11:04 PM  Result Value Ref Range Status   SARS Coronavirus 2 POSITIVE (A) NEGATIVE Final    Comment: RESULT CALLED TO, READ BACK BY AND VERIFIED WITH: Lily Lovings RN 01/01/19 0015 JDW (NOTE) If result is NEGATIVE SARS-CoV-2 target nucleic acids are NOT DETECTED. The SARS-CoV-2 RNA is generally detectable in upper and lower  respiratory specimens during the acute phase of infection. The lowest  concentration of SARS-CoV-2 viral copies this assay can detect is 250  copies / mL. A negative result does not preclude SARS-CoV-2 infection  and should not be used as the sole basis for treatment or other  patient management decisions.  A negative result may occur with  improper specimen collection / handling, submission of specimen other  than nasopharyngeal swab, presence of viral mutation(s) within the  areas targeted by this assay, and inadequate number of viral copies  (<250 copies / mL). A negative result must be combined with clinical  observations,  patient history, and epidemiological information. If result is POSITIVE SARS-CoV-2 target nucleic acids are DETECTED. The SA RS-CoV-2 RNA is generally detectable in upper and lower  respiratory specimens during the acute phase of infection.  Positive  results are indicative of active infection with SARS-CoV-2.  Clinical  correlation with patient history and other diagnostic information is  necessary to determine patient infection status.  Positive results do  not rule out bacterial infection or co-infection with other viruses. If result is PRESUMPTIVE POSTIVE SARS-CoV-2 nucleic acids MAY BE PRESENT.   A presumptive positive result was obtained on the submitted specimen  and confirmed on repeat testing.  While 2019 novel coronavirus  (SARS-CoV-2) nucleic acids may be present in the submitted sample  additional confirmatory testing may be necessary for epidemiological  and / or clinical management purposes  to differentiate between  SARS-CoV-2 and other Sarbecovirus currently known to infect humans.  If clinically indicated additional testing with an alternate test  methodology [phone]) is advi sed. The SARS-CoV-2 RNA is generally  detectable in upper and lower respiratory specimens during the acute  phase of infection. The expected result is Negative. Fact Sheet for Patients:  BoilerBrush.com.cy Fact Sheet for Healthcare Providers: https://pope.com/ This test is not yet approved or cleared by the [place] FDA and has been authorized for detection and/or diagnosis of SARS-CoV-2 by FDA under an Emergency Use Authorization (EUA).  This EUA will remain in effect (meaning this test can be used) for the duration of the COVID-19 declaration under Section 564(b)(1) of the Act, 21 U.S.C. section 360bbb-3(b)(1), unless the authorization is terminated or revoked sooner. Performed at [facility], [address] [place],  [place] [address]   Blood culture (routine x 2)     Status: None (Preliminary result)   Collection Time: 12/31/18 11:15 PM  Result  Value Ref Range Status   Specimen Description BLOOD RIGHT ARM  Final   Special Requests   Final    BOTTLES DRAWN AEROBIC AND ANAEROBIC Blood Culture adequate volume   Culture   Final    NO GROWTH 1 DAY Performed at [facility], [address] [place], [place] [address]    Report Status PENDING  Incomplete  Blood culture (routine x 2)     Status: None (Preliminary result)   Collection Time: 12/31/18 11:30 PM  Result Value Ref Range Status   Specimen Description BLOOD RIGHT HAND  Final   Special Requests   Final    BOTTLES DRAWN AEROBIC ONLY Blood Culture adequate volume   Culture   Final    NO GROWTH 1 DAY Performed at [facility], [address] [place], [place] [address]    Report Status PENDING  Incomplete    FURTHER DISCHARGE INSTRUCTIONS:  Get Medicines reviewed and adjusted: Please take all your medications with you for your next visit with your Primary MD  Laboratory/radiological data: Please request your Primary MD to go over all hospital tests and procedure/radiological results at the follow up, please ask your Primary MD to get all Hospital records sent to his/her office.  In some cases, they will be blood work, cultures and biopsy results pending at the time of your discharge. Please request that your primary care M.D. goes through all the records of your hospital data and follows up on these results.  Also Note the following: If you experience worsening of your admission symptoms, develop shortness of breath, life threatening emergency, suicidal or homicidal thoughts you must seek medical attention immediately by calling 911 or calling your MD immediately  if symptoms less severe.  You must read complete instructions/literature along with all the possible adverse reactions/side effects for all the Medicines you take and that have  been prescribed to you. Take any new Medicines after you have completely understood and accpet all the possible adverse reactions/side effects.   Do not drive when taking Pain medications or sleeping medications (Benzodaizepines)  Do not take more than prescribed Pain, Sleep and Anxiety Medications. It is not advisable to combine anxiety,sleep and pain medications without talking with your primary care practitioner  Special Instructions: If you have smoked or chewed Tobacco  in the last 2 yrs please stop smoking, stop any regular Alcohol  and or any Recreational drug use.  Wear Seat belts while driving.  Please note: You were cared for by a hospitalist during your hospital stay. Once you are discharged, your primary care physician will handle any further medical issues. Please note that NO REFILLS for any discharge medications will be authorized once you are discharged, as it is imperative that you return to your primary care physician (or establish a relationship with a primary care physician if you do not have one) for your post hospital discharge needs so that they can reassess your need for medications and monitor your lab values.  Total Time spent coordinating discharge including counseling, education and face to face time equals 25 minutes.  Signed: Kiaria Quinnell 01/03/2019 10:14 AM

## 2019-01-03 NOTE — Telephone Encounter (Signed)
Message received from Geni Bers, RN CM requesting hospital follow up appointment for patient at [facility].  Informed her that a televisit  appointment has been scheduled for 01/11/2019 @ 1430 .

## 2019-01-03 NOTE — Telephone Encounter (Signed)
Per Geni Bers, RN CM the patient's contact his is daughter, Hospital doctor  # [phone]

## 2019-01-03 NOTE — Discharge Instructions (Signed)
Person Under Monitoring Name: Mark Bass  Location[place] [address] [place] [id number]   Infection Prevention Recommendations for Individuals Confirmed to have, or Being Evaluated for, 2019 Novel Coronavirus (COVID-19) Infection Who Receive Care at Home  Individuals who are confirmed to have, or are being evaluated for, COVID-19 should follow the prevention steps below until a healthcare provider or local or state health department says they can return to normal activities.  Stay home except to get medical care You should restrict activities outside your home, except for getting medical care. Do not go to work, school, or public areas, and do not use public transportation or taxis.  Call ahead before visiting your doctor Before your medical appointment, call the healthcare provider and tell them that you have, or are being evaluated for, COVID-19 infection. This will help the healthcare providers office take steps to keep other people from getting infected. Ask your healthcare provider to call the local or state health department.  Monitor your symptoms Seek prompt medical attention if your illness is worsening (e.g., difficulty breathing). Before going to your medical appointment, call the healthcare provider and tell them that you have, or are being evaluated for, COVID-19 infection. Ask your healthcare provider to call the local or state health department.  Wear a facemask You should wear a facemask that covers your nose and mouth when you are in the same room with other people and when you visit a healthcare provider. People who live with or visit you should also wear a facemask while they are in the same room with you.  Separate yourself from other people in your home As much as possible, you should stay in a different room from other people in your home. Also, you should use a separate bathroom, if available.  Avoid sharing household items You should not share  dishes, drinking glasses, cups, eating utensils, towels, bedding, or other items with other people in your home. After using these items, you should wash them thoroughly with soap and water.  Cover your coughs and sneezes Cover your mouth and nose with a tissue when you cough or sneeze, or you can cough or sneeze into your sleeve. Throw used tissues in a lined trash can, and immediately wash your hands with soap and water for at least 20 seconds or use an alcohol-based hand rub.  Wash your [employer]hands Wash your hands often and thoroughly with soap and water for at least 20 seconds. You can use an alcohol-based hand sanitizer if soap and water are not available and if your hands are not visibly dirty. Avoid touching your eyes, nose, and mouth with unwashed hands.   Prevention Steps for Caregivers and Household Members of Individuals Confirmed to have, or Being Evaluated for, COVID-19 Infection Being Cared for in the Home  If you live with, or provide care at home for, a person confirmed to have, or being evaluated for, COVID-19 infection please follow these guidelines to prevent infection:  Follow healthcare providers instructions Make sure that you understand and can help the patient follow any healthcare provider instructions for all care.  Provide for the patients basic needs You should help the patient with basic needs in the home and provide support for getting groceries, prescriptions, and other personal needs.  Monitor the patients symptoms If they are getting sicker, call his or her medical provider and tell them that the patient has, or is being evaluated for, COVID-19 infection. This will help the healthcare providers office take  steps to keep other people from getting infected. Ask the healthcare provider to call the local or state health department.  Limit the number of people who have contact with the patient  If possible, have only one caregiver for the patient.  Other  household members should stay in another home or place of residence. If this is not possible, they should stay  in another room, or be separated from the patient as much as possible. Use a separate bathroom, if available.  Restrict visitors who do not have an essential need to be in the home.  Keep older adults, very young children, and other sick people away from the patient Keep older adults, very young children, and those who have compromised immune systems or chronic health conditions away from the patient. This includes people with chronic heart, lung, or kidney conditions, diabetes, and cancer.  Ensure good ventilation Make sure that shared spaces in the home have good air flow, such as from an air conditioner or an opened window, weather permitting.  Wash your hands often  Wash your hands often and thoroughly with soap and water for at least 20 seconds. You can use an alcohol based hand sanitizer if soap and water are not available and if your hands are not visibly dirty.  Avoid touching your eyes, nose, and mouth with unwashed hands.  Use disposable paper towels to dry your hands. If not available, use dedicated cloth towels and replace them when they become wet.  Wear a facemask and gloves  Wear a disposable facemask at all times in the room and gloves when you touch or have contact with the patients blood, body fluids, and/or secretions or excretions, such as sweat, saliva, sputum, nasal mucus, vomit, urine, or feces.  Ensure the mask fits over your nose and mouth tightly, and do not touch it during use.  Throw out disposable facemasks and gloves after using them. Do not reuse.  Wash your hands immediately after removing your facemask and gloves.  If your personal clothing becomes contaminated, carefully remove clothing and launder. Wash your hands after handling contaminated clothing.  Place all used disposable facemasks, gloves, and other waste in a lined container before  disposing them with other household waste.  Remove gloves and wash your hands immediately after handling these items.  Do not share dishes, glasses, or other household items with the patient  Avoid sharing household items. You should not share dishes, drinking glasses, cups, eating utensils, towels, bedding, or other items with a patient who is confirmed to have, or being evaluated for, COVID-19 infection.  After the person uses these items, you should wash them thoroughly with soap and water.  Wash laundry thoroughly  Immediately remove and wash clothes or bedding that have blood, body fluids, and/or secretions or excretions, such as sweat, saliva, sputum, nasal mucus, vomit, urine, or feces, on them.  Wear gloves when handling laundry from the patient.  Read and follow directions on labels of laundry or clothing items and detergent. In general, wash and dry with the warmest temperatures recommended on the label.  Clean all areas the individual has used often  Clean all touchable surfaces, such as counters, tabletops, doorknobs, bathroom fixtures, toilets, phones, keyboards, tablets, and bedside tables, every day. Also, clean any surfaces that may have blood, body fluids, and/or secretions or excretions on them.  Wear gloves when cleaning surfaces the patient has come in contact with.  Use a diluted bleach solution (e.g., dilute bleach with 1 part bleach  and 10 parts water) or a household disinfectant with a label that says EPA-registered for coronaviruses. To make a bleach solution at home, add 1 tablespoon of bleach to 1 quart (4 cups) of water. For a larger supply, add  cup of bleach to 1 gallon (16 cups) of water.  Read labels of cleaning products and follow recommendations provided on product labels. Labels contain instructions for safe and effective use of the cleaning product including precautions you should take when applying the product, such as wearing gloves or eye protection  and making sure you have good ventilation during use of the product.  Remove gloves and wash hands immediately after cleaning.  Monitor yourself for signs and symptoms of illness Caregivers and household members are considered close contacts, should monitor their health, and will be asked to limit movement outside of the home to the extent possible. Follow the monitoring steps for close contacts listed on the symptom monitoring form.   ? If you have additional questions, contact your local health department or call the epidemiologist on call at [phone] (available 24/7). ? This guidance is subject to change. For the most up-to-date guidance from [facility], please refer to their website: TripMetro.hu      Person Under Monitoring Name: Mark Bass  Location: [address] [place] [address]   CORONAVIRUS DISEASE 2019 (COVID-19) Guidance for Persons Under Investigation You are being tested for the virus that causes coronavirus disease 2019 (COVID-19). Public health actions are necessary to ensure protection of your health and the health of others, and to prevent further spread of infection. COVID-19 is caused by a virus that can cause symptoms, such as fever, cough, and shortness of breath. The primary transmission from person to person is by coughing or sneezing. On September 06, 2018, the World Health Organization announced a [employer] Emergency of International Concern and on September 07, 2018 the U.S. Department of Health and Human Services declared a public health emergency. If the virus that causesCOVID-19 spreads in the community, it could have severe public health consequences.  As a person under investigation for COVID-19, the [employer] of Health and [employer] Division of [employer] advises you to adhere to the following guidance until your test results are reported to you. If your test result  is positive, you will receive additional information from your provider and your local health department at that time.   Remain at home until you are cleared by your health provider or public health authorities.   Keep a log of visitors to your home using the form provided. Any visitors to your home must be aware of your isolation status.  If you plan to move to a new address or leave the county, notify the local health department in your county.  Call a doctor or seek care if you have an urgent medical need. Before seeking medical care, call ahead and get instructions from the provider before arriving at the medical office, clinic or hospital. Notify them that you are being tested for the virus that causes COVID-19 so arrangements can be made, as necessary, to prevent transmission to others in the healthcare setting. Next, notify the local health department in your county.  If a medical emergency arises and you need to call 911, inform the first responders that you are being tested for the virus that causes COVID-19. Next, notify the local health department in your county.  Adhere to all guidance set forth by the [employer] of [employer]  [facility] for Home Care of patients that is based on guidance from the Center for Disease Control and Prevention with suspected or confirmed COVID-19. It is provided with this guidance for Persons Under Investigation.  Your health and the health of our community are our top priorities. Public Health officials remain available to provide assistance and counseling to you about COVID-19 and compliance with this guidance.  Provider: ____________________________________________________________ Date: ______/_____/_________  By signing below, you acknowledge that you have read and agree to comply with this Guidance for Persons Under Investigation. ______________________________________________________________ Date: ______/_____/_________  WHO DO I CALL? You  can find a list of local health departments here: [email or web address].org/ Health Department: ____________________________________________________________________ Contact Name: ________________________________________________________________________ Telephone: ___________________________________________________________________________  Nedra Hai, Division of Public Health, Communicable Disease Branch COVID-19 Guidance for Persons Under Investigation October 13, 2018

## 2019-01-04 ENCOUNTER — Telehealth (INDEPENDENT_AMBULATORY_CARE_PROVIDER_SITE_OTHER): Payer: Self-pay | Admitting: General Practice

## 2019-01-04 LAB — INTERLEUKIN-6, PLASMA: Interleukin-6, Plasma: 20.8 pg/mL — ABNORMAL HIGH (ref 0.0–12.2)

## 2019-01-04 NOTE — Telephone Encounter (Signed)
Ph# for daughter - texted mychart link - advised to complete daily questionnaire once signed up

## 2019-01-06 LAB — CULTURE, BLOOD (ROUTINE X 2)
Culture: NO GROWTH
Culture: NO GROWTH
Special Requests: ADEQUATE
Special Requests: ADEQUATE

## 2019-01-07 ENCOUNTER — Emergency Department (HOSPITAL_COMMUNITY): Payer: 59

## 2019-01-07 ENCOUNTER — Emergency Department (HOSPITAL_COMMUNITY)
Admission: EM | Admit: 2019-01-07 | Discharge: 2019-01-07 | Disposition: A | Discharge status: Home / Self Care | Payer: 59 | Attending: Emergency Medicine | Admitting: Emergency Medicine

## 2019-01-07 ENCOUNTER — Other Ambulatory Visit: Payer: Self-pay

## 2019-01-07 ENCOUNTER — Encounter (HOSPITAL_COMMUNITY): Payer: Self-pay | Admitting: Emergency Medicine

## 2019-01-07 DIAGNOSIS — U071 COVID-19: Secondary | ICD-10-CM | POA: Insufficient documentation

## 2019-01-07 DIAGNOSIS — R0602 Shortness of breath: Secondary | ICD-10-CM

## 2019-01-07 DIAGNOSIS — J1282 Pneumonia due to coronavirus disease 2019: Secondary | ICD-10-CM

## 2019-01-07 DIAGNOSIS — Z79899 Other long term (current) drug therapy: Secondary | ICD-10-CM | POA: Insufficient documentation

## 2019-01-07 DIAGNOSIS — J1289 Other viral pneumonia: Secondary | ICD-10-CM | POA: Diagnosis not present

## 2019-01-07 DIAGNOSIS — Z87891 Personal history of nicotine dependence: Secondary | ICD-10-CM | POA: Insufficient documentation

## 2019-01-07 NOTE — ED Provider Notes (Signed)
[name]   CSN: [id number] Arrival date & time: 01/07/19  1823    History   Chief Complaint Chief Complaint  Patient presents with  . Shortness of Breath    HPI Mark Bass is a 58 y.o. male.     Patient with recent admission for COVID-19 pneumonia from 5/26-5/28 --presents emergency department today complaining of shortness of breath.  Patient states that he continues to be short of breath since his hospitalization.  He denies any fevers or chills.  No nausea, vomiting, or diarrhea.  No chest pain or abdominal pain.  States that his shortness of breath is with his exertion.  Denies any medications.  He has had sick contacts at home who have also tested positive for COVID-19.  Onset of symptoms acute.  Course is constant.     History reviewed. No pertinent past medical history.  Patient Active Problem List   Diagnosis Date Noted  . COVID-19 virus infection 01/02/2019  . Pneumonia due to COVID-19 virus 01/01/2019  . Elevated blood pressure reading without diagnosis of hypertension 01/01/2019  . Dehydration 01/01/2019  . Sepsis (HCC) 01/01/2019    History reviewed. No pertinent surgical history.      Home Medications    Prior to Admission medications   Medication Sig Start Date End Date Taking? Authorizing Provider  albuterol (VENTOLIN HFA) 108 (90 Base) MCG/ACT inhaler Inhale 2 puffs into the lungs every 6 (six) hours as needed for wheezing or shortness of breath. 01/03/19   Ghimire, Werner LeanShanker M, MD  amLODipine (NORVASC) 10 MG tablet Take 10 mg by mouth daily. 12/26/18   [provider]  ascorbic acid (VITAMIN C) 500 MG tablet Take 1 tablet (500 mg total) by mouth daily. 01/03/19   Ghimire, Werner LeanShanker M, MD  benzonatate (TESSALON PERLES) 100 MG capsule Take 1 capsule (100 mg total) by mouth every 6 (six) hours as needed for cough. 01/03/19 01/03/20  Ghimire, Werner LeanShanker M, MD  metoprolol tartrate (LOPRESSOR) 50 MG tablet  Take 50 mg by mouth daily. 12/26/18   [provider]  predniSONE (DELTASONE) 10 MG tablet Take 40 mg daily for 1 day, 30 mg daily for 1 day, 20 mg daily for 1 day,10 mg daily for 1 days, then stop 01/03/19   Maretta BeesGhimire, Shanker M, MD  zinc sulfate 220 (50 Zn) MG capsule Take 1 capsule (220 mg total) by mouth daily. 01/03/19   Ghimire, Werner LeanShanker M, MD    Family History Family History  Family history unknown: Yes    Social History Social History   Tobacco Use  . Smoking status: Former Games developermoker  . Smokeless tobacco: Never Used  . Tobacco comment: Patient smoked in his early 7520s.  Substance Use Topics  . Alcohol use: Not Currently  . Drug use: Never     Allergies   Patient has no known allergies.   Review of Systems Review of Systems  Constitutional: Negative for fever.  HENT: Negative for rhinorrhea and sore throat.   Eyes: Negative for redness.  Respiratory: Positive for cough and shortness of breath. Negative for wheezing.   Cardiovascular: Negative for chest pain.  Gastrointestinal: Negative for abdominal pain, diarrhea, nausea and vomiting.  Genitourinary: Negative for dysuria.  Musculoskeletal: Negative for myalgias.  Skin: Negative for rash.  Neurological: Negative for headaches.     Physical Exam Updated Vital Signs BP (!) 156/107 (BP Location: Left Arm)   Pulse 88   Temp 98.4 F (36.9 C) (Oral)  Resp 16   SpO2 96%   Physical Exam Vitals signs and nursing note reviewed.  Constitutional:      Appearance: He is well-developed.  HENT:     Head: Normocephalic and atraumatic.  Eyes:     General:        Right eye: No discharge.        Left eye: No discharge.     Conjunctiva/sclera: Conjunctivae normal.  Neck:     Musculoskeletal: Normal range of motion and neck supple.  Cardiovascular:     Rate and Rhythm: Normal rate and regular rhythm.     Heart sounds: Normal heart sounds.  Pulmonary:     Effort: Pulmonary effort is normal.     Breath sounds:  Normal breath sounds. No decreased breath sounds, wheezing, rhonchi or rales.     Comments: Lung sounds are clear.  No tachypnea. Abdominal:     Palpations: Abdomen is soft.     Tenderness: There is no abdominal tenderness.  Skin:    General: Skin is warm and dry.  Neurological:     Mental Status: He is alert.      ED Treatments / Results  Labs (all labs ordered are listed, but only abnormal results are displayed) Labs Reviewed - No data to display  EKG None  Radiology Dg Chest [facility] 1 View  Result Date: 01/07/2019 CLINICAL DATA:  Shortness of breath x1 week. EXAM: PORTABLE CHEST 1 VIEW COMPARISON:  12/31/2018 FINDINGS: Improved aeration is noted at the right lung base. There are few scattered small airspace opacities throughout all lung fields. The cardiac size is stable but enlarged. There is no pneumothorax or large pleural effusion. IMPRESSION: 1. Improved consolidation at the right lung base. 2. Persistent scattered small airspace opacities bilaterally which can be seen in patients with an atypical infectious process such as viral pneumonia. 3. Stable enlarged cardiac silhouette. Electronically Signed   By: Katherine Mantle M.D.   On: 01/07/2019 20:14    Procedures Procedures (including critical care time)  Medications Ordered in ED Medications - No data to display   Initial Impression / Assessment and Plan / ED Course  I have reviewed the triage vital signs and the nursing notes.  Pertinent labs & imaging results that were available during my care of the patient were reviewed by me and considered in my medical decision making (see chart for details).        Patient seen and examined. Work-up initiated.  Will ambulate and check pulse oximetry.  Vital signs reviewed and are as follows: BP (!) 156/107 (BP Location: Left Arm)   Pulse 88   Temp 98.4 F (36.9 C) (Oral)   Resp 16   SpO2 96%   Patient has ambulated in the room and maintained oxygen saturation at  99%.  No indication for further work-up or admission at this time.  I saw the patient at time of admission last week.  His heart rate is much improved with improved work of breathing today.  Overall he looks much better than he did at admission last week.  Suspect that his shortness of breath is related to his recent pneumonitis.  He should continue to isolate himself at home.    Encouraged return with worsening shortness of breath, increased work of breathing, high fever, vomiting persistently, or other concerns.  Final Clinical Impressions(s) / ED Diagnoses   Final diagnoses:  SOB (shortness of breath)  Pneumonia due to COVID-19 virus   Patient with shortness of breath  related to recently diagnosed COVID-19 pneumonia.  Patient was hospitalized for 2 days mainly due to abnormal vital signs.  Today patient is not tachycardic, afebrile, not tachypneic.  He has ambulated without desaturation.  He is stable for discharged home at this time with continued isolation.   ED Discharge Orders    None       Renne Crigler, Cordelia Poche 01/07/19 2059    Raeford Razor, MD 01/07/19 2342

## 2019-01-07 NOTE — ED Notes (Signed)
Pt ambulated in room with O2 sat of 99%. Pt stated he was "short of breath".

## 2019-01-07 NOTE — Discharge Instructions (Signed)
Please read and follow all provided instructions.  Your diagnoses today include:  1. Pneumonia due to COVID-19 virus   2. SOB (shortness of breath)     Tests performed today include:  Chest x-ray -continues to show pneumonia  Vital signs. See below for your results today.   Medications prescribed:   None  Take any prescribed medications only as directed.  Home care instructions:  Follow any educational materials contained in this packet.  BE VERY CAREFUL not to take multiple medicines containing Tylenol (also called acetaminophen). Doing so can lead to an overdose which can damage your liver and cause liver failure and possibly death.   Follow-up instructions: Please follow-up with your primary care provider in the next 3 days for further evaluation of your symptoms.   Return instructions:   Please return to the Emergency Department if you experience worsening symptoms.   Return with worsening shortness of breath, trouble breathing, high fever.  Please return if you have any other emergent concerns.  Additional Information:  Your vital signs today were: BP (!) 156/107 (BP Location: Left Arm)    Pulse 88    Temp 98.4 F (36.9 C) (Oral)    Resp 16    SpO2 96%  If your blood pressure (BP) was elevated above 135/85 this visit, please have this repeated by your doctor within one month. --------------

## 2019-01-07 NOTE — ED Triage Notes (Signed)
Pt c/o shortness of breath x 1 week. Tested COVID + on 5/25. NAD noted at this time.

## 2019-01-11 ENCOUNTER — Telehealth: Payer: Self-pay | Admitting: *Deleted

## 2019-01-11 ENCOUNTER — Ambulatory Visit: Payer: 59 | Attending: Family Medicine | Admitting: Family Medicine

## 2019-01-11 NOTE — Telephone Encounter (Signed)
MA reached out to patient via mobile number with no answer x2 and no VM option. MA reached out to patients daughter Mark Bass and she stated she was not available.. daughter provided MA with her brothers number. MA reached out to the son who stated he was not with the father either and had no recollection of the appointment. MA reached out to patients mobile number x2 again with no answer. Patient will need to be rescheduled for FU.

## 2020-10-10 ENCOUNTER — Inpatient Hospital Stay (HOSPITAL_COMMUNITY)
Admission: EM | Admit: 2020-10-10 | Discharge: 2020-10-13 | DRG: 064 | Disposition: A | Discharge status: Home / Self Care | Payer: 59 | Attending: Internal Medicine | Admitting: Internal Medicine

## 2020-10-10 ENCOUNTER — Emergency Department (HOSPITAL_COMMUNITY): Payer: 59

## 2020-10-10 ENCOUNTER — Inpatient Hospital Stay (HOSPITAL_COMMUNITY): Payer: 59

## 2020-10-10 ENCOUNTER — Encounter (HOSPITAL_COMMUNITY): Payer: Self-pay

## 2020-10-10 ENCOUNTER — Other Ambulatory Visit: Payer: Self-pay

## 2020-10-10 DIAGNOSIS — R531 Weakness: Principal | ICD-10-CM

## 2020-10-10 DIAGNOSIS — I10 Essential (primary) hypertension: Secondary | ICD-10-CM

## 2020-10-10 DIAGNOSIS — I672 Cerebral atherosclerosis: Secondary | ICD-10-CM | POA: Diagnosis present

## 2020-10-10 DIAGNOSIS — I161 Hypertensive emergency: Secondary | ICD-10-CM | POA: Diagnosis present

## 2020-10-10 DIAGNOSIS — Z20822 Contact with and (suspected) exposure to covid-19: Secondary | ICD-10-CM | POA: Diagnosis present

## 2020-10-10 DIAGNOSIS — Z87891 Personal history of nicotine dependence: Secondary | ICD-10-CM

## 2020-10-10 DIAGNOSIS — Y92009 Unspecified place in unspecified non-institutional (private) residence as the place of occurrence of the external cause: Secondary | ICD-10-CM

## 2020-10-10 DIAGNOSIS — Z91128 Patient's intentional underdosing of medication regimen for other reason: Secondary | ICD-10-CM | POA: Diagnosis not present

## 2020-10-10 DIAGNOSIS — Y92002 Bathroom of unspecified non-institutional (private) residence single-family (private) house as the place of occurrence of the external cause: Secondary | ICD-10-CM | POA: Diagnosis not present

## 2020-10-10 DIAGNOSIS — I610 Nontraumatic intracerebral hemorrhage in hemisphere, subcortical: Principal | ICD-10-CM | POA: Diagnosis present

## 2020-10-10 DIAGNOSIS — W19XXXA Unspecified fall, initial encounter: Secondary | ICD-10-CM | POA: Diagnosis present

## 2020-10-10 DIAGNOSIS — E785 Hyperlipidemia, unspecified: Secondary | ICD-10-CM | POA: Diagnosis present

## 2020-10-10 DIAGNOSIS — R29701 NIHSS score 1: Secondary | ICD-10-CM | POA: Diagnosis present

## 2020-10-10 DIAGNOSIS — Z79899 Other long term (current) drug therapy: Secondary | ICD-10-CM

## 2020-10-10 DIAGNOSIS — T502X6A Underdosing of carbonic-anhydrase inhibitors, benzothiadiazides and other diuretics, initial encounter: Secondary | ICD-10-CM | POA: Diagnosis present

## 2020-10-10 DIAGNOSIS — Z8616 Personal history of COVID-19: Secondary | ICD-10-CM | POA: Diagnosis not present

## 2020-10-10 DIAGNOSIS — I619 Nontraumatic intracerebral hemorrhage, unspecified: Secondary | ICD-10-CM | POA: Diagnosis present

## 2020-10-10 DIAGNOSIS — G8194 Hemiplegia, unspecified affecting left nondominant side: Secondary | ICD-10-CM | POA: Diagnosis present

## 2020-10-10 DIAGNOSIS — I6389 Other cerebral infarction: Secondary | ICD-10-CM

## 2020-10-10 DIAGNOSIS — D72829 Elevated white blood cell count, unspecified: Secondary | ICD-10-CM | POA: Diagnosis present

## 2020-10-10 DIAGNOSIS — G936 Cerebral edema: Secondary | ICD-10-CM

## 2020-10-10 DIAGNOSIS — E876 Hypokalemia: Secondary | ICD-10-CM | POA: Diagnosis present

## 2020-10-10 DIAGNOSIS — R29810 Facial weakness: Secondary | ICD-10-CM | POA: Diagnosis present

## 2020-10-10 HISTORY — DX: Essential (primary) hypertension: I10

## 2020-10-10 LAB — DIFFERENTIAL
Abs Immature Granulocytes: 0.04 10*3/uL (ref 0.00–0.07)
Basophils Absolute: 0.1 10*3/uL (ref 0.0–0.1)
Basophils Relative: 1 %
Eosinophils Absolute: 0.8 10*3/uL — ABNORMAL HIGH (ref 0.0–0.5)
Eosinophils Relative: 6 %
Immature Granulocytes: 0 %
Lymphocytes Relative: 33 %
Lymphs Abs: 4 10*3/uL (ref 0.7–4.0)
Monocytes Absolute: 0.9 10*3/uL (ref 0.1–1.0)
Monocytes Relative: 7 %
Neutro Abs: 6.4 10*3/uL (ref 1.7–7.7)
Neutrophils Relative %: 53 %

## 2020-10-10 LAB — ECHOCARDIOGRAM COMPLETE
P 1/2 time: 295 msec
S' Lateral: 3.3 cm

## 2020-10-10 LAB — COMPREHENSIVE METABOLIC PANEL
ALT: 17 U/L (ref 0–44)
AST: 23 U/L (ref 15–41)
Albumin: 3.8 g/dL (ref 3.5–5.0)
Alkaline Phosphatase: 67 U/L (ref 38–126)
Anion gap: 12 (ref 5–15)
BUN: 17 mg/dL (ref 8–23)
CO2: 22 mmol/L (ref 22–32)
Calcium: 8.9 mg/dL (ref 8.9–10.3)
Chloride: 99 mmol/L (ref 98–111)
Creatinine, Ser: 1 mg/dL (ref 0.61–1.24)
GFR, Estimated: >60 mL/min (ref >60)
Glucose, Bld: 168 mg/dL — ABNORMAL HIGH (ref 70–99)
Potassium: 3.1 mmol/L — ABNORMAL LOW (ref 3.5–5.1)
Sodium: 133 mmol/L — ABNORMAL LOW (ref 135–145)
Total Bilirubin: 0.6 mg/dL (ref 0.3–1.2)
Total Protein: 7.9 g/dL (ref 6.5–8.1)

## 2020-10-10 LAB — CBC
HCT: 44.8 % (ref 39.0–52.0)
Hemoglobin: 14.2 g/dL (ref 13.0–17.0)
MCH: 21.5 pg — ABNORMAL LOW (ref 26.0–34.0)
MCHC: 31.7 g/dL (ref 30.0–36.0)
MCV: 67.9 fL — ABNORMAL LOW (ref 80.0–100.0)
Platelets: 221 10*3/uL (ref 150–400)
RBC: 6.6 MIL/uL — ABNORMAL HIGH (ref 4.22–5.81)
RDW: 16.8 % — ABNORMAL HIGH (ref 11.5–15.5)
WBC: 12.2 10*3/uL — ABNORMAL HIGH (ref 4.0–10.5)
nRBC: 0 % (ref 0.0–0.2)

## 2020-10-10 LAB — URINALYSIS, ROUTINE W REFLEX MICROSCOPIC
Bacteria, UA: NONE SEEN
Bilirubin Urine: NEGATIVE
Glucose, UA: 50 mg/dL — AB
Hgb urine dipstick: NEGATIVE
Ketones, ur: NEGATIVE mg/dL
Leukocytes,Ua: NEGATIVE
Nitrite: NEGATIVE
Protein, ur: 30 mg/dL — AB
Specific Gravity, Urine: 1.011 (ref 1.005–1.030)
pH: 6 (ref 5.0–8.0)

## 2020-10-10 LAB — PHOSPHORUS: Phosphorus: 2 mg/dL — ABNORMAL LOW (ref 2.5–4.6)

## 2020-10-10 LAB — LIPID PANEL
Cholesterol: 194 mg/dL (ref 0–200)
HDL: 51 mg/dL (ref >40)
LDL Cholesterol: 110 mg/dL — ABNORMAL HIGH (ref 0–99)
Total CHOL/HDL Ratio: 3.8 RATIO
Triglycerides: 165 mg/dL — ABNORMAL HIGH (ref <150)
VLDL: 33 mg/dL (ref 0–40)

## 2020-10-10 LAB — PROTIME-INR
INR: 0.9 (ref 0.8–1.2)
Prothrombin Time: 12.1 seconds (ref 11.4–15.2)

## 2020-10-10 LAB — RESP PANEL BY RT-PCR (FLU A&B, COVID) ARPGX2
Influenza A by PCR: NEGATIVE
Influenza B by PCR: NEGATIVE
SARS Coronavirus 2 by RT PCR: NEGATIVE

## 2020-10-10 LAB — TYPE AND SCREEN
ABO/RH(D): A POS
Antibody Screen: NEGATIVE

## 2020-10-10 LAB — I-STAT CHEM 8, ED
BUN: 20 mg/dL (ref 8–23)
Calcium, Ion: 1.11 mmol/L — ABNORMAL LOW (ref 1.15–1.40)
Chloride: 99 mmol/L (ref 98–111)
Creatinine, Ser: 0.9 mg/dL (ref 0.61–1.24)
Glucose, Bld: 170 mg/dL — ABNORMAL HIGH (ref 70–99)
HCT: 49 % (ref 39.0–52.0)
Hemoglobin: 16.7 g/dL (ref 13.0–17.0)
Potassium: 3.1 mmol/L — ABNORMAL LOW (ref 3.5–5.1)
Sodium: 137 mmol/L (ref 135–145)
TCO2: 26 mmol/L (ref 22–32)

## 2020-10-10 LAB — RAPID URINE DRUG SCREEN, HOSP PERFORMED
Amphetamines: NOT DETECTED
Barbiturates: NOT DETECTED
Benzodiazepines: NOT DETECTED
Cocaine: NOT DETECTED
Opiates: NOT DETECTED
Tetrahydrocannabinol: NOT DETECTED

## 2020-10-10 LAB — HEMOGLOBIN A1C
Hgb A1c MFr Bld: 6.6 % — ABNORMAL HIGH (ref 4.8–5.6)
Mean Plasma Glucose: 142.72 mg/dL

## 2020-10-10 LAB — ETHANOL: Alcohol, Ethyl (B): <10 mg/dL (ref <10)

## 2020-10-10 LAB — MAGNESIUM: Magnesium: 1.8 mg/dL (ref 1.7–2.4)

## 2020-10-10 LAB — APTT: aPTT: 29 seconds (ref 24–36)

## 2020-10-10 LAB — CBG MONITORING, ED: Glucose-Capillary: 192 mg/dL — ABNORMAL HIGH (ref 70–99)

## 2020-10-10 LAB — HIV ANTIBODY (ROUTINE TESTING W REFLEX): HIV Screen 4th Generation wRfx: NONREACTIVE

## 2020-10-10 LAB — MRSA PCR SCREENING: MRSA by PCR: NEGATIVE

## 2020-10-10 LAB — TROPONIN I (HIGH SENSITIVITY): Troponin I (High Sensitivity): 10 ng/L (ref <18)

## 2020-10-10 MED ORDER — SODIUM CHLORIDE 0.9 % IV SOLN: INTRAVENOUS | Status: DC

## 2020-10-10 MED ORDER — LABETALOL HCL 5 MG/ML IV SOLN: 10.0000 mg | Freq: Once | INTRAVENOUS | Status: AC

## 2020-10-10 MED ORDER — POTASSIUM CHLORIDE CRYS ER 20 MEQ PO TBCR
40.0000 meq | EXTENDED_RELEASE_TABLET | ORAL | Status: AC
Start: 2020-10-10 — End: 2020-10-11
  Administered 2020-10-10 – 2020-10-11 (×2): 40 meq via ORAL
  Filled 2020-10-10: qty 2

## 2020-10-10 MED ORDER — LOSARTAN POTASSIUM 50 MG PO TABS
50.0000 mg | ORAL_TABLET | Freq: Two times a day (BID) | ORAL | Status: DC
  Administered 2020-10-10 – 2020-10-13 (×6): 50 mg via ORAL
  Filled 2020-10-10 (×7): qty 1

## 2020-10-10 MED ORDER — SENNOSIDES-DOCUSATE SODIUM 8.6-50 MG PO TABS
1.0000 | ORAL_TABLET | Freq: Two times a day (BID) | ORAL | Status: DC
  Administered 2020-10-11 – 2020-10-13 (×4): 1 via ORAL
  Filled 2020-10-10 (×4): qty 1

## 2020-10-10 MED ORDER — METOPROLOL TARTRATE 25 MG PO TABS
25.0000 mg | ORAL_TABLET | Freq: Two times a day (BID) | ORAL | Status: DC
  Administered 2020-10-10 – 2020-10-13 (×6): 25 mg via ORAL
  Filled 2020-10-10 (×6): qty 1

## 2020-10-10 MED ORDER — ACETAMINOPHEN 325 MG PO TABS
650.0000 mg | ORAL_TABLET | ORAL | Status: DC | PRN
  Administered 2020-10-10 – 2020-10-12 (×4): 650 mg via ORAL
  Filled 2020-10-10 (×4): qty 2

## 2020-10-10 MED ORDER — CLEVIDIPINE BUTYRATE 0.5 MG/ML IV EMUL
0.0000 mg/h | INTRAVENOUS | Status: DC
  Administered 2020-10-10: 21 mg/h via INTRAVENOUS
  Administered 2020-10-10: 1 mg/h via INTRAVENOUS
  Administered 2020-10-10 (×4): 21 mg/h via INTRAVENOUS
  Filled 2020-10-10 (×7): qty 50

## 2020-10-10 MED ORDER — PANTOPRAZOLE SODIUM 40 MG IV SOLR
40.0000 mg | Freq: Every day | INTRAVENOUS | Status: DC
  Administered 2020-10-10: 40 mg via INTRAVENOUS
  Filled 2020-10-10: qty 40

## 2020-10-10 MED ORDER — CLEVIDIPINE BUTYRATE 0.5 MG/ML IV EMUL
0.0000 mg/h | INTRAVENOUS | Status: DC
  Administered 2020-10-10 (×3): 21 mg/h via INTRAVENOUS
  Administered 2020-10-10: 14 mg/h via INTRAVENOUS
  Filled 2020-10-10 (×6): qty 50

## 2020-10-10 MED ORDER — CHLORHEXIDINE GLUCONATE CLOTH 2 % EX PADS
6.0000 | MEDICATED_PAD | Freq: Every day | CUTANEOUS | Status: DC
  Administered 2020-10-11: 6 via TOPICAL

## 2020-10-10 MED ORDER — IOHEXOL 350 MG/ML SOLN
75.0000 mL | Freq: Once | INTRAVENOUS | Status: AC
  Administered 2020-10-10: 75 mL via INTRAVENOUS

## 2020-10-10 MED ORDER — AMLODIPINE BESYLATE 10 MG PO TABS
10.0000 mg | ORAL_TABLET | Freq: Every day | ORAL | Status: DC
  Administered 2020-10-10 – 2020-10-13 (×4): 10 mg via ORAL
  Filled 2020-10-10 (×4): qty 1

## 2020-10-10 MED ORDER — STROKE: EARLY STAGES OF RECOVERY BOOK
Freq: Once | Status: AC
  Filled 2020-10-10: qty 1

## 2020-10-10 MED ORDER — ACETAMINOPHEN 160 MG/5ML PO SOLN: 650.0000 mg | ORAL | Status: DC | PRN

## 2020-10-10 MED ORDER — CLEVIDIPINE BUTYRATE 0.5 MG/ML IV EMUL
0.0000 mg/h | INTRAVENOUS | Status: DC
  Administered 2020-10-10: 21 mg/h via INTRAVENOUS

## 2020-10-10 MED ORDER — LABETALOL HCL 5 MG/ML IV SOLN
INTRAVENOUS | Status: AC
  Administered 2020-10-10: 20 mg
  Filled 2020-10-10: qty 4

## 2020-10-10 MED ORDER — ACETAMINOPHEN 650 MG RE SUPP: 650.0000 mg | RECTAL | Status: DC | PRN

## 2020-10-10 NOTE — Progress Notes (Addendum)
STROKE TEAM PROGRESS NOTE   INTERVAL HISTORY His son is at the bedside. Son serves as Equities traderinterpreter as there is no interpreter service available for dialect. He tells me he only has mild h/a now. BP remains elevated and cleviprex gtt continues as he has not been passed for po yet.   Vitals:   10/10/20 1430 10/10/20 1445 10/10/20 1500 10/10/20 1515  BP: 130/81 137/78 134/81 (!) 145/90  Pulse: (!) 102   (!) 107  Resp: 18 (!) 21 15 16   Temp:      TempSrc:      SpO2: 99%  98% 98%   CBC:  Recent Labs  Lab 10/10/20 0502 10/10/20 0507  WBC 12.2*  --   NEUTROABS 6.4  --   HGB 14.2 16.7  HCT 44.8 49.0  MCV 67.9*  --   PLT 221  --    Basic Metabolic Panel:  Recent Labs  Lab 10/10/20 0502 10/10/20 0507 10/10/20 0603  NA 133* 137  --   K 3.1* 3.1*  --   CL 99 99  --   CO2 22  --   --   GLUCOSE 168* 170*  --   BUN 17 20  --   CREATININE 1.00 0.90  --   CALCIUM 8.9  --   --   MG  --   --  1.8  PHOS  --   --  2.0*   Lipid Panel:  Recent Labs  Lab 10/10/20 0603  CHOL 194  TRIG 165*  HDL 51  CHOLHDL 3.8  VLDL 33  LDLCALC 284110*   HgbA1c:  Recent Labs  Lab 10/10/20 0603  HGBA1C 6.6*   Urine Drug Screen:  Recent Labs  Lab 10/10/20 0827  LABOPIA NONE DETECTED  COCAINSCRNUR NONE DETECTED  LABBENZ NONE DETECTED  AMPHETMU NONE DETECTED  THCU NONE DETECTED  LABBARB NONE DETECTED    Alcohol Level  Recent Labs  Lab 10/10/20 0502  ETH <10    IMAGING past 24 hours CT ANGIO HEAD W OR WO CONTRAST  Result Date: 10/10/2020 CLINICAL DATA:  Follow-up parenchymal hemorrhage EXAM: CT ANGIOGRAPHY HEAD AND NECK TECHNIQUE: Multidetector CT imaging of the head and neck was performed using the standard protocol during bolus administration of intravenous contrast. Multiplanar CT image reconstructions and MIPs were obtained to evaluate the vascular anatomy. Carotid stenosis measurements (when applicable) are obtained utilizing NASCET criteria, using the distal internal carotid  diameter as the denominator. CONTRAST:  75mL OMNIPAQUE IOHEXOL 350 MG/ML SOLN COMPARISON:  None. FINDINGS: CTA NECK FINDINGS Aortic arch: Normal arch.  Three vessel branching. Right carotid system: Low-density atheromatous type wall thickening of the common carotid. No stenosis, ulceration, or beading. Left carotid system: Low-density atheromatous type wall thickening of the common carotid. No stenosis, ulceration, or beading. Vertebral arteries: No proximal subclavian stenosis. Codominant vertebral arteries which are smooth and widely patent to the dura. Skeleton: No acute or aggressive finding. Other neck: Nodular and bandlike scarring in the suboccipital neck. Upper chest: Negative Review of the MIP images confirms the above findings CTA HEAD FINDINGS Anterior circulation: No proximal occlusion, aneurysm, or vascular malformation. Widespread atheromatous changes to ACA and MCA distributions. The worst stenosis is at the right A2/A3 level. Right MCA stenosis is notable for moderate M1 and M2 narrowings. Posterior circulation: No proximal occlusion, aneurysm, or vascular malformation. Moderate atheromatous type narrowing at the right vertebrobasilar junction and at the mid basilar. Left more than right PCA atheromatous irregularity and stenosis. Venous sinuses: Not opacified in  the arterial phase Anatomic variants: None significant. Other: No spot sign seen at the parenchymal hemorrhage on the right which has a stable shape. Review of the MIP images confirms the above findings IMPRESSION: 1. No spot sign of or visible vascular lesion underlying the parenchymal hemorrhage. 2. Advanced intracranial atherosclerosis. Electronically Signed   By: Marnee Spring M.D.   On: 10/10/2020 09:17   DG Chest 1 View  Result Date: 10/10/2020 CLINICAL DATA:  Hemorrhagic stroke EXAM: CHEST  1 VIEW COMPARISON:  01/07/2019 FINDINGS: Cardiomegaly. Stable mediastinal contours. There is no edema, consolidation, effusion, or  pneumothorax. Artifact from EKG leads. IMPRESSION: No acute finding. Chronic cardiomegaly. Electronically Signed   By: Marnee Spring M.D.   On: 10/10/2020 06:26   CT ANGIO NECK W OR WO CONTRAST  Result Date: 10/10/2020 CLINICAL DATA:  Follow-up parenchymal hemorrhage EXAM: CT ANGIOGRAPHY HEAD AND NECK TECHNIQUE: Multidetector CT imaging of the head and neck was performed using the standard protocol during bolus administration of intravenous contrast. Multiplanar CT image reconstructions and MIPs were obtained to evaluate the vascular anatomy. Carotid stenosis measurements (when applicable) are obtained utilizing NASCET criteria, using the distal internal carotid diameter as the denominator. CONTRAST:  47mL OMNIPAQUE IOHEXOL 350 MG/ML SOLN COMPARISON:  None. FINDINGS: CTA NECK FINDINGS Aortic arch: Normal arch.  Three vessel branching. Right carotid system: Low-density atheromatous type wall thickening of the common carotid. No stenosis, ulceration, or beading. Left carotid system: Low-density atheromatous type wall thickening of the common carotid. No stenosis, ulceration, or beading. Vertebral arteries: No proximal subclavian stenosis. Codominant vertebral arteries which are smooth and widely patent to the dura. Skeleton: No acute or aggressive finding. Other neck: Nodular and bandlike scarring in the suboccipital neck. Upper chest: Negative Review of the MIP images confirms the above findings CTA HEAD FINDINGS Anterior circulation: No proximal occlusion, aneurysm, or vascular malformation. Widespread atheromatous changes to ACA and MCA distributions. The worst stenosis is at the right A2/A3 level. Right MCA stenosis is notable for moderate M1 and M2 narrowings. Posterior circulation: No proximal occlusion, aneurysm, or vascular malformation. Moderate atheromatous type narrowing at the right vertebrobasilar junction and at the mid basilar. Left more than right PCA atheromatous irregularity and stenosis. Venous  sinuses: Not opacified in the arterial phase Anatomic variants: None significant. Other: No spot sign seen at the parenchymal hemorrhage on the right which has a stable shape. Review of the MIP images confirms the above findings IMPRESSION: 1. No spot sign of or visible vascular lesion underlying the parenchymal hemorrhage. 2. Advanced intracranial atherosclerosis. Electronically Signed   By: Marnee Spring M.D.   On: 10/10/2020 09:17   ECHOCARDIOGRAM COMPLETE  Result Date: 10/10/2020    ECHOCARDIOGRAM REPORT   Patient Name:   Mark Bass  Date of Exam: 10/10/2020 Medical Rec #:  [id number]  Height:       60.0 in Accession #:    [id number] Weight:       140.0 lb Date of Birth:  10-Apr-1961   BSA:          1.604 m Patient Age:    60 years   BP:           136/80 mmHg Patient Gender: M          HR:           101 bpm. Exam Location:  Inpatient Procedure: 2D Echo, Cardiac Doppler and Color Doppler Indications:    Stroke I163.9  History:        Patient  has no prior history of Echocardiogram examinations.                 Risk Factors:Hypertension. COVID-19.  Sonographer:    Elmarie Shiley Dance Referring Phys: [id number] HUNTER J COLLINS IMPRESSIONS  1. Left ventricular ejection fraction, by estimation, is 55 to 60%. The left ventricle has normal function. The left ventricle has no regional wall motion abnormalities. Left ventricular diastolic parameters are consistent with Grade I diastolic dysfunction (impaired relaxation).  2. Right ventricular systolic function is normal. The right ventricular size is normal.  3. Left atrial size was mildly dilated.  4. The mitral valve is normal in structure. Trivial mitral valve regurgitation. No evidence of mitral stenosis.  5. The aortic valve is normal in structure. Aortic valve regurgitation is not visualized. No aortic stenosis is present.  6. Aortic dilatation noted. There is mild dilatation of the aortic root, measuring 39 mm.  7. The inferior vena cava is normal in size with greater than  50% respiratory variability, suggesting right atrial pressure of 3 mmHg. FINDINGS  Left Ventricle: Left ventricular ejection fraction, by estimation, is 55 to 60%. The left ventricle has normal function. The left ventricle has no regional wall motion abnormalities. The left ventricular internal cavity size was normal in size. There is  no left ventricular hypertrophy. Left ventricular diastolic parameters are consistent with Grade I diastolic dysfunction (impaired relaxation). Right Ventricle: The right ventricular size is normal. No increase in right ventricular wall thickness. Right ventricular systolic function is normal. Left Atrium: Left atrial size was mildly dilated. Right Atrium: Right atrial size was normal in size. Pericardium: There is no evidence of pericardial effusion. Mitral Valve: The mitral valve is normal in structure. Trivial mitral valve regurgitation. No evidence of mitral valve stenosis. Tricuspid Valve: The tricuspid valve is normal in structure. Tricuspid valve regurgitation is not demonstrated. No evidence of tricuspid stenosis. Aortic Valve: The aortic valve is normal in structure. Aortic valve regurgitation is not visualized. Aortic regurgitation PHT measures 295 msec. No aortic stenosis is present. Pulmonic Valve: The pulmonic valve was normal in structure. Pulmonic valve regurgitation is not visualized. No evidence of pulmonic stenosis. Aorta: Aortic dilatation noted. There is mild dilatation of the aortic root, measuring 39 mm. Venous: The inferior vena cava is normal in size with greater than 50% respiratory variability, suggesting right atrial pressure of 3 mmHg. IAS/Shunts: No atrial level shunt detected by color flow Doppler.  LEFT VENTRICLE PLAX 2D LVIDd:         4.80 cm LVIDs:         3.30 cm LV PW:         1.50 cm LV IVS:        1.00 cm LVOT diam:     2.10 cm LV SV:         74 LV SV Index:   46 LVOT Area:     3.46 cm  RIGHT VENTRICLE             IVC RV Basal diam:  2.50 cm      IVC diam: 1.80 cm RV S prime:     17.00 cm/s TAPSE (M-mode): 2.5 cm LEFT ATRIUM             Index       RIGHT ATRIUM           Index LA diam:        4.20 cm 2.62 cm/m  RA Area:     14.40 cm  LA Vol (A2C):   63.8 ml 39.77 ml/m RA Volume:   34.60 ml  21.57 ml/m LA Vol (A4C):   49.6 ml 30.92 ml/m LA Biplane Vol: 56.4 ml 35.16 ml/m  AORTIC VALVE LVOT Vmax:   96.70 cm/s LVOT Vmean:  67.800 cm/s LVOT VTI:    0.213 m AI PHT:      295 msec  AORTA Ao Root diam: 3.10 cm Ao Asc diam:  3.90 cm  SHUNTS Systemic VTI:  0.21 m Systemic Diam: 2.10 cm Donato Schultz MD Electronically signed by Donato Schultz MD Signature Date/Time: 10/10/2020/2:06:39 PM    Final    CT HEAD CODE STROKE WO CONTRAST  Result Date: 10/10/2020 CLINICAL DATA:  Code stroke.  Left facial weakness EXAM: CT HEAD WITHOUT CONTRAST TECHNIQUE: Contiguous axial images were obtained from the base of the skull through the vertex without intravenous contrast. COMPARISON:  None. FINDINGS: Brain: 2.5 x 2 x 3.6 cm high-density hematoma centered at the posterolateral right putamen and external capsule. Minimal adjacent edema. There is history of hypertension. Mild white matter low-density, usually chronic microvascular ischemia. No evidence of cortical infarct. No hydrocephalus or midline shift. Vascular: No hyperdense vessel or unexpected calcification. Skull: Normal. Negative for fracture or focal lesion. Sinuses/Orbits: No acute finding. Other: Right suboccipital scarring. ASPECTS [facility] Stroke Program Early CT Score) Not scored in the setting Critical Value/emergent results were called by telephone at the time of interpretation on 10/10/2020 at 5:17 am to provider [facility] , who verbally acknowledged these results. IMPRESSION: 9 cc acute hematoma at the posterior right putamen and adjacent white matter. Electronically Signed   By: Marnee Spring M.D.   On: 10/10/2020 05:28    PHYSICAL EXAM Appears well-developed and well-nourished. No acute distress Psych:  Affect appropriate to situation Eyes: + scleral injection HENT: No OP obstrucion Head: Normocephalic.  Cardiovascular: Tachycardic with regular rhythm.  Respiratory: Effort normal and breath sounds normal to anterior ascultation GI: Soft.  No distension. There is no tenderness.  Skin: WDI  Neuro: Mental Status: He is awake, alert, oriented to person, place, year, and situation. There was a language barrier and history gathered through his son, who does not describe a change in his speech from baseline: we can only assume that speech is fluent intact. No signs of aphasia or neglect. CN: Visual Fields are full. Pupils are equal, round, and reactive to light. EOMI without ptosis or diploplia. Facial sensation is symmetric to temperature Face left lower weakness. Hearing is intact to voice. Uvula midline, palate elevates symmetrically. Shoulder shrug is symmetric.Tongue is midline  Motor: Tone is normal. Bulk is normal. 5/5 strength was present in all four extremities. Sensation: is symmetric to light touch and temperature in the arms and legs. Deep Tendon Reflexes: 2+ and symmetric in the biceps and patellae. Toes are downgoing bilaterally. Coordination: FNF and HKS are intact bilaterally. Gait: Deferred  ASSESSMENT/PLAN Mr. Marty Uy is a 60 y.o. male with history of HTN and medication non-compliance. On 3/5 pt awoke very early at ~0400 to go to the bathroom and became dizzy with headache. He had difficulty standing and left facial droop. CTH showed Right BG ICH. ICH score 1.  ICH: Right BG, likely due to HTN etiology.   CT head - right BG ICH, ~ 10cc  CTA head & neck: severe athro disease, but no underlying AVM or etiology for bleed. No Spot sign.   MRI  Pending in am  EF 55-60%  LDL 110  HgbA1c 6.6  UDS negative  VTE prophylaxis - SCDs  No antithrombotics PTA, now not on antithrombotics due to ICH  Therapy recommendation - pending  Deposition - pending  Hypertensive  emergency  Home meds:  None  Unstable . Cleviprex gtt, wean off as able . Add amlodipine, losartan and metoprolol . Long-term BP goal normotensive  Hyperlipidemia  Home meds: none  LDL 110, goal < 70  No statin for now due to ICH  Consider statin on discharge  Other Stroke Risk Factors  Asian decent  Previous cigarette smoker  Other acute issues  Leukocytosis, WBC 12.2  Hypokalemia - K 3.0, supplement  Hospital day # 0  Desiree Metzger-Cihelka, ARNP-C, ANVP-BC Pager: [phone]   ATTENDING NOTE: I reviewed above note and agree with the assessment and plan. Pt was seen and examined.   60 year old male with history of hypertension admitted for dizziness, headache, fall with hypertensive emergency.  CT head showed right BG ICH.  CT head and neck showed stable hematoma, no underlying vascular abnormality.  MRI pending in the morning.  EF 55 to 60%.  A1c 6.6, LDL 110.  UDS negative.  UA negative.  WBC 12.2, potassium 3.1.  On exam, patient awake alert, orientated x3.  He stated that his left-sided weakness much improved.  Visual field full, no gaze preference, no significant facial droop.  Left upper extremity pronator drift, decreased finger grip and dexterity.  Left lower extremity 5/5 proximal and 4/5 distal PF/DF.  Sensory symmetrical.  Finger-to-nose intact bilaterally.  Patient will be admitted to ICU due to ICH and blood pressure management with IV drip.  She has pending swallow, will add p.o. BP meds try to taper off Cleviprex.  BP goal less than 160 now given stable hematoma.  Supplement potassium.  PT/OT pending.  For detailed assessment and plan, please refer to above as I have made changes wherever appropriate.   Marvel Plan, MD PhD Stroke Neurology 10/10/2020 7:56 PM  This patient is critically ill due to ICH, hypertensive emergency and at significant risk of neurological worsening, death form hematoma expansion, brain herniation. This patient's care  requires constant monitoring of vital signs, hemodynamics, respiratory and cardiac monitoring, review of multiple databases, neurological assessment, discussion with family, other specialists and medical decision making of high complexity. I spent 30 minutes of neurocritical care time in the care of this patient.   To contact Stroke Continuity provider, please refer to [email or web address]. After hours, contact General Neurology

## 2020-10-10 NOTE — ED Notes (Signed)
Transported pt to Development worker, community for MD to evaluate.

## 2020-10-10 NOTE — Progress Notes (Signed)
PT Cancellation Note  Patient Details Name: Mark Bass MRN: [id number] DOB: 03-26-1961   Cancelled Treatment:    Reason Eval/Treat Not Completed: Active bedrest order this morning. PT will continue to follow and evaluate when appropriate.   Deland Pretty, DPT   Acute Rehabilitation Department Pager #: [phone]   Gaetana Michaelis 10/10/2020, 7:39 AM

## 2020-10-10 NOTE — ED Provider Notes (Signed)
[name] EMERGENCY DEPARTMENT Provider Note   CSN: [id number] Arrival date & time: 10/10/20  0445     History Chief Complaint  Patient presents with  . Code Stroke    Mark Bass is a 60 y.o. male.   Neurologic Problem This is a new problem. The current episode started 1 to 2 hours ago. The problem occurs constantly. The problem has not changed since onset.Associated symptoms include headaches. Pertinent negatives include no chest pain, no abdominal pain and no shortness of breath. Nothing aggravates the symptoms. Nothing relieves the symptoms. He has tried nothing for the symptoms.       Past Medical History:  Diagnosis Date  . Hypertension     Patient Active Problem List   Diagnosis Date Noted  . COVID-19 virus infection 01/02/2019  . Pneumonia due to COVID-19 virus 01/01/2019  . Elevated blood pressure reading without diagnosis of hypertension 01/01/2019  . Dehydration 01/01/2019  . Sepsis (HCC) 01/01/2019    Past Surgical History:  Procedure Laterality Date  . NECK SURGERY         Family History  Family history unknown: Yes    Social History   Tobacco Use  . Smoking status: Former Games developer  . Smokeless tobacco: Never Used  . Tobacco comment: Patient smoked in his early 40s.  Substance Use Topics  . Alcohol use: Not Currently  . Drug use: Never    Home Medications Prior to Admission medications   Medication Sig Start Date End Date Taking? Authorizing Provider  albuterol (VENTOLIN HFA) 108 (90 Base) MCG/ACT inhaler Inhale 2 puffs into the lungs every 6 (six) hours as needed for wheezing or shortness of breath. 01/03/19   Ghimire, Werner Lean, MD  amLODipine (NORVASC) 10 MG tablet Take 10 mg by mouth daily. 12/26/18   [provider]  ascorbic acid (VITAMIN C) 500 MG tablet Take 1 tablet (500 mg total) by mouth daily. 01/03/19   Ghimire, Werner Lean, MD  metoprolol tartrate (LOPRESSOR) 50 MG tablet Take 50 mg by mouth daily. 12/26/18    [provider]  predniSONE (DELTASONE) 10 MG tablet Take 40 mg daily for 1 day, 30 mg daily for 1 day, 20 mg daily for 1 day,10 mg daily for 1 days, then stop 01/03/19   Maretta Bees, MD  zinc sulfate 220 (50 Zn) MG capsule Take 1 capsule (220 mg total) by mouth daily. 01/03/19   Ghimire, Werner Lean, MD    Allergies    Patient has no known allergies.  Review of Systems   Review of Systems  Respiratory: Negative for shortness of breath.   Cardiovascular: Negative for chest pain.  Gastrointestinal: Negative for abdominal pain.  Neurological: Positive for headaches.  All other systems reviewed and are negative.   Physical Exam Updated Vital Signs BP (!) 174/97   Pulse (!) 109   Temp 98.4 F (36.9 C) (Oral)   Resp (!) 21   SpO2 96%   Physical Exam Vitals and nursing note reviewed.  Constitutional:      Appearance: He is well-developed and well-nourished.  HENT:     Head: Normocephalic and atraumatic.     Nose: Nose normal. No congestion or rhinorrhea.     Mouth/Throat:     Mouth: Mucous membranes are moist.     Pharynx: Oropharynx is clear.  Eyes:     Pupils: Pupils are equal, round, and reactive to light.  Cardiovascular:     Rate and Rhythm: Normal rate.  Pulmonary:  Effort: Pulmonary effort is normal. No respiratory distress.  Abdominal:     General: Abdomen is flat. There is no distension.  Musculoskeletal:        General: No swelling or tenderness. Normal range of motion.     Cervical back: Normal range of motion.  Skin:    General: Skin is warm and dry.     Coloration: Skin is not jaundiced or pale.  Neurological:     Mental Status: He is alert.     Comments: Left facial droop, flaccid left arm and grip strength non existent.      ED Results / Procedures / Treatments   Labs (all labs ordered are listed, but only abnormal results are displayed) Labs Reviewed  CBC - Abnormal; Notable for the following components:      Result Value   WBC  12.2 (*)    RBC 6.60 (*)    MCV 67.9 (*)    MCH 21.5 (*)    RDW 16.8 (*)    All other components within normal limits  DIFFERENTIAL - Abnormal; Notable for the following components:   Eosinophils Absolute 0.8 (*)    All other components within normal limits  I-STAT CHEM 8, ED - Abnormal; Notable for the following components:   Potassium 3.1 (*)    Glucose, Bld 170 (*)    Calcium, Ion 1.11 (*)    All other components within normal limits  CBG MONITORING, ED - Abnormal; Notable for the following components:   Glucose-Capillary 192 (*)    All other components within normal limits  RESP PANEL BY RT-PCR (FLU A&B, COVID) ARPGX2  PROTIME-INR  APTT  ETHANOL  COMPREHENSIVE METABOLIC PANEL  RAPID URINE DRUG SCREEN, HOSP PERFORMED  URINALYSIS, ROUTINE W REFLEX MICROSCOPIC    EKG EKG Interpretation  Date/Time:  Saturday October 10 2020 05:14:46 EST Ventricular Rate:  97 PR Interval:    QRS Duration: 93 QT Interval:  353 QTC Calculation: 449 R Axis:   8 Text Interpretation: Sinus rhythm Probable left atrial enlargement ST elevation, consider anterior injury no ischemia noted Confirmed by Marily Memos [phone]) on 10/10/2020 5:24:35 AM   Radiology CT HEAD CODE STROKE WO CONTRAST  Result Date: 10/10/2020 CLINICAL DATA:  Code stroke.  Left facial weakness EXAM: CT HEAD WITHOUT CONTRAST TECHNIQUE: Contiguous axial images were obtained from the base of the skull through the vertex without intravenous contrast. COMPARISON:  None. FINDINGS: Brain: 2.5 x 2 x 3.6 cm high-density hematoma centered at the posterolateral right putamen and external capsule. Minimal adjacent edema. There is history of hypertension. Mild white matter low-density, usually chronic microvascular ischemia. No evidence of cortical infarct. No hydrocephalus or midline shift. Vascular: No hyperdense vessel or unexpected calcification. Skull: Normal. Negative for fracture or focal lesion. Sinuses/Orbits: No acute finding. Other:  Right suboccipital scarring. ASPECTS [facility] Stroke Program Early CT Score) Not scored in the setting Critical Value/emergent results were called by telephone at the time of interpretation on 10/10/2020 at 5:17 am to provider [facility] , who verbally acknowledged these results. IMPRESSION: 9 cc acute hematoma at the posterior right putamen and adjacent white matter. Electronically Signed   By: Marnee Spring M.D.   On: 10/10/2020 05:28    Procedures .Critical Care Performed by: Marily Memos, MD Authorized by: Marily Memos, MD   Critical care provider statement:    Critical care time (minutes):  45   Critical care was necessary to treat or prevent imminent or life-threatening deterioration of the following conditions:  CNS failure or compromise   Critical care was time spent personally by me on the following activities:  Discussions with consultants, evaluation of patient's response to treatment, examination of patient, ordering and performing treatments and interventions, ordering and review of laboratory studies, ordering and review of radiographic studies, pulse oximetry, re-evaluation of patient's condition, obtaining history from patient or surrogate and review of old charts     Medications Ordered in ED Medications  clevidipine (CLEVIPREX) infusion 0.5 mg/mL (21 mg/hr Intravenous Rate/Dose Change 10/10/20 0530)  labetalol (NORMODYNE) 5 MG/ML injection (has no administration in time range)  labetalol (NORMODYNE) injection 10 mg (has no administration in time range)    ED Course  I have reviewed the triage vital signs and the nursing notes.  Pertinent labs & imaging results that were available during my care of the patient were reviewed by me and considered in my medical decision making (see chart for details).    MDM Rules/Calculators/A&P                          Last known well around 0 400.  I difficulty walking back from the bathroom with left-sided weakness.  Code stroke  activated upon arrival.  Ultimately found to have a large right intracranial hemorrhage.  Cleviprex was started.  Neurology at bedside and assuming care at this time. To be admitted to neuro icu.   Final Clinical Impression(s) / ED Diagnoses Final diagnoses:  Left-sided weakness  Right-sided nontraumatic intracerebral hemorrhage, unspecified cerebral location [facility])  Hypertension, unspecified type    Rx / DC Orders ED Discharge Orders    None       Kairo Laubacher, Barbara Cower, MD 10/10/20 [phone]

## 2020-10-10 NOTE — ED Notes (Signed)
Neurology at bedside.

## 2020-10-10 NOTE — H&P (Signed)
Neurology H&P  Laymond Postle MR# [id number] 10/10/2020  CC: code stroke  History is obtained from: Son as there was no translator available for dialect  HPI: Julis Haubner is a 60 y.o. male right handed MPHx as reviwed below, HTN who stopped taking antihypertensive medications some time ago for unknown reason got up feeling dizzy with a headache, went to the bathroom and collapsed. His wife went to see what was taking so long and found him down. She then called their son ~3 who activated EMS.  This has never happened before. Denies recent illness, N/V/F/C, cp, sob.  LKW: 0400 tpa given?: No, ICH IR Thrombectomy? No, ICH Modified Rankin Scale: 0-Completely asymptomatic and back to baseline post- stroke NIHSS: 1  ROS: A complete ROS was performed and is negative except as noted in the HPI.    Past Medical History:  Diagnosis Date  . Hypertension     Family History  Family history unknown: Yes   Social History:  reports that he has quit smoking. He has never used smokeless tobacco. He reports previous alcohol use. He reports that he does not use drugs.  Prior to Admission medications   Medication Sig Start Date End Date Taking? Authorizing Provider  albuterol (VENTOLIN HFA) 108 (90 Base) MCG/ACT inhaler Inhale 2 puffs into the lungs every 6 (six) hours as needed for wheezing or shortness of breath. 01/03/19   Ghimire, Werner Lean, MD  amLODipine (NORVASC) 10 MG tablet Take 10 mg by mouth daily. 12/26/18   [provider]  ascorbic acid (VITAMIN C) 500 MG tablet Take 1 tablet (500 mg total) by mouth daily. 01/03/19   Ghimire, Werner Lean, MD  metoprolol tartrate (LOPRESSOR) 50 MG tablet Take 50 mg by mouth daily. 12/26/18   [provider]  predniSONE (DELTASONE) 10 MG tablet Take 40 mg daily for 1 day, 30 mg daily for 1 day, 20 mg daily for 1 day,10 mg daily for 1 days, then stop 01/03/19   Maretta Bees, MD  zinc sulfate 220 (50 Zn) MG capsule Take 1 capsule (220 mg total)  by mouth daily. 01/03/19   Ghimire, Werner Lean, MD   Exam: Current vital signs: BP 136/81   Pulse (!) 110   Temp 98.4 F (36.9 C) (Oral)   Resp (!) 21   SpO2 98%   Physical Exam  Appears well-developed and well-nourished.  Psych: Affect appropriate to situation Eyes: + scleral injection HENT: No OP obstrucion Head: Normocephalic.  Cardiovascular: Tachycardic with regular rhythm.  Respiratory: Effort normal and breath sounds normal to anterior ascultation GI: Soft.  No distension. There is no tenderness.  Skin: WDI  Neuro: Mental Status: Patient is somnolent with eyes closed but responds to voice. He is awake, alert, oriented to person, place, year, and situation. There was a language barrier and history gathered through his son. Speech fluent, intact comprehension and repetition. No signs of aphasia or neglect. Visual Fields are full. Pupils are equal, round, and reactive to light. EOMI without ptosis or diploplia.  Facial sensation is symmetric to temperature Face left lower weakness.  Hearing is intact to voice. Uvula midline, palate elevates symmetrically. Shoulder shrug is symmetric. Tongue is midline without atrophy or fasciculations.  Tone is normal. Bulk is normal. 5/5 strength was present in all four extremities. Sensation is symmetric to light touch and temperature in the arms and legs. Deep Tendon Reflexes: 2+ and symmetric in the biceps and patellae. Toes are downgoing bilaterally. FNF and HKS are intact bilaterally.  Gait - Deferred  I have reviewed labs in epic and the pertinent results are:   Ref. Range 10/10/2020 05:07  Potassium Latest Ref Range: 3.5 - 5.1 mmol/L 3.1 (L)  Chloride Latest Ref Range: 98 - 111 mmol/L 99  Glucose Latest Ref Range: 70 - 99 mg/dL 371 (H)    I have reviewed the images obtained: NCT head showed 9 cc acute hematoma at the posterior right putamen and adjacent white matter.  Assessment: : Akshat Minehart is a 61 y.o. male right handed  MPHx HTN who has been non-adherent with his antihypertensive medications admitted with hemorrhagic stroke in the right basal ganglia ICH score 1. He will be admitted to ICU for close monitoring and stroke workup.  Impression:  Non-traumatic hemorrhagic stroke - Right basal ganglia Cerebral edema At risk for further intracranial bleeding Hypertensive emergency Left face droop Medication non-adherence NIHSS 1 ICH score 1  Plan: Elevate head of bed keep head midline. Start clevidipine drip for goal SBP<140 - May increase infusion beyond 21 as needed. Labetalol 10mg  PRN. X-ray chest. Admit to neuro ICU. Consult neurosurgery. May use fentanyl for pain. IV fluids gentle hydration. Replete electrolytes as needed. Labs: Coags, CBC, type and cross, CMP, Mg, Phos, fasting lipids, hA1c, troponins, urinalysis, tox screen. Normothermia - Acetaminophen for temperature >37.5C Euglycemia (~ <180) Euvolemia - Strict I/Os Repeat CT head in 6 hours. MRI brain when stable. MRA head and neck when stable. Precautions: Airway and herniation, seizure, aspiration. PPx: SCDs for now, Senna/docusate, PPI.   This patient is critically ill and at significant risk of neurological worsening, death and care requires constant monitoring of vital signs, hemodynamics,respiratory and cardiac monitoring, neurological assessment, discussion with family, other specialists and medical decision making of high complexity. I spent 73 minutes of neurocritical care time  in the care of  this patient. This was time spent independent of any time provided by nurse practitioner or PA.   , MD Page: Marisue Humble

## 2020-10-10 NOTE — Progress Notes (Signed)
OT Cancellation Note  Patient Details Name: Mark Bass MRN: [id number] DOB: Mar 28, 1961   Cancelled Treatment:    Reason Eval/Treat Not Completed: Active bedrest order.  Will reattempt.  Eber Jones., OTR/L Acute Rehabilitation Services Pager [phone] Office [phone]   Jeani Hawking M 10/10/2020, 1:00 PM

## 2020-10-10 NOTE — ED Notes (Signed)
Patient transported to CT 

## 2020-10-10 NOTE — ED Triage Notes (Addendum)
Pt's son reports that at 0400am, pt got up to go to the bathroom, he became dizzy, c/o headache, and then could not stand up. Pt appears to have a left sided facial droop, left arm weakness, left leg weakness.

## 2020-10-10 NOTE — Code Documentation (Signed)
Responded to Code Stroke called at 0505 on pt already in ED for L sided facial droop, [id number]. NIH-7, CBG-192, CT head-9cc acute hematoma at the POST R putamen and adjacent white matter. Pt taken back to ED room, cleviprex started, and labetolol given. Plan to admit to ICU.

## 2020-10-10 NOTE — ED Notes (Signed)
Echo at bedside

## 2020-10-10 NOTE — Progress Notes (Signed)
MRI waiting on RN to set up a time to accompany patient to MRI.  RN unable to come at this time.  Will call us to set up a time when available.

## 2020-10-11 ENCOUNTER — Inpatient Hospital Stay (HOSPITAL_COMMUNITY): Payer: 59

## 2020-10-11 LAB — BASIC METABOLIC PANEL
Anion gap: 10 (ref 5–15)
BUN: 13 mg/dL (ref 8–23)
CO2: 25 mmol/L (ref 22–32)
Calcium: 9.2 mg/dL (ref 8.9–10.3)
Chloride: 104 mmol/L (ref 98–111)
Creatinine, Ser: 0.98 mg/dL (ref 0.61–1.24)
GFR, Estimated: >60 mL/min (ref >60)
Glucose, Bld: 121 mg/dL — ABNORMAL HIGH (ref 70–99)
Potassium: 3.2 mmol/L — ABNORMAL LOW (ref 3.5–5.1)
Sodium: 139 mmol/L (ref 135–145)

## 2020-10-11 LAB — CBC
HCT: 44.6 % (ref 39.0–52.0)
Hemoglobin: 14.3 g/dL (ref 13.0–17.0)
MCH: 21.2 pg — ABNORMAL LOW (ref 26.0–34.0)
MCHC: 32.1 g/dL (ref 30.0–36.0)
MCV: 66 fL — ABNORMAL LOW (ref 80.0–100.0)
Platelets: 220 10*3/uL (ref 150–400)
RBC: 6.76 MIL/uL — ABNORMAL HIGH (ref 4.22–5.81)
RDW: 17.2 % — ABNORMAL HIGH (ref 11.5–15.5)
WBC: 9.4 10*3/uL (ref 4.0–10.5)
nRBC: 0 % (ref 0.0–0.2)

## 2020-10-11 MED ORDER — LABETALOL HCL 5 MG/ML IV SOLN: 20.0000 mg | INTRAVENOUS | Status: DC | PRN

## 2020-10-11 MED ORDER — PANTOPRAZOLE SODIUM 40 MG PO TBEC
40.0000 mg | DELAYED_RELEASE_TABLET | Freq: Every day | ORAL | Status: DC
  Administered 2020-10-11 – 2020-10-12 (×2): 40 mg via ORAL
  Filled 2020-10-11 (×2): qty 1

## 2020-10-11 MED ORDER — FENTANYL CITRATE (PF) 100 MCG/2ML IJ SOLN
75.0000 ug | INTRAMUSCULAR | Status: DC | PRN
  Administered 2020-10-11: 75 ug via INTRAVENOUS
  Filled 2020-10-11: qty 2

## 2020-10-11 MED ORDER — HYDRALAZINE HCL 20 MG/ML IJ SOLN: 10.0000 mg | INTRAMUSCULAR | Status: DC | PRN

## 2020-10-11 NOTE — Plan of Care (Signed)
  Problem: Education: Goal: Knowledge of disease or condition will improve Outcome: Progressing Goal: Knowledge of secondary prevention will improve Outcome: Progressing Goal: Knowledge of patient specific risk factors addressed and post discharge goals established will improve Outcome: Progressing Goal: Individualized Educational Video(s) Outcome: Progressing   Problem: Coping: Goal: Will verbalize positive feelings about self Outcome: Progressing Goal: Will identify appropriate support needs Outcome: Progressing   Problem: Health Behavior/Discharge Planning: Goal: Ability to manage health-related needs will improve Outcome: Progressing   Problem: Self-Care: Goal: Ability to participate in self-care as condition permits will improve Outcome: Progressing Goal: Verbalization of feelings and concerns over difficulty with self-care will improve Outcome: Progressing Goal: Ability to communicate needs accurately will improve Outcome: Progressing   Problem: Nutrition: Goal: Risk of aspiration will decrease Outcome: Progressing Goal: Dietary intake will improve Outcome: Progressing   Problem: Intracerebral Hemorrhage Tissue Perfusion: Goal: Complications of Intracerebral Hemorrhage will be minimized Outcome: Progressing   

## 2020-10-11 NOTE — Progress Notes (Signed)
Inpatient Rehab Admissions Coordinator Note:   Per therapy recommendations, pt was screened for CIR candidacy by Lajarvis Italiano, MS CCC-SLP. At this time, Pt. Appears to have functional decline and is a good candidate for CIR. Will request order for rehab consult per protocol.  Please contact me with questions.   Iley Breeden, MS, CCC-SLP Rehab Admissions Coordinator  [phone] (celll) [phone] (office)  

## 2020-10-11 NOTE — Evaluation (Signed)
Occupational Therapy Evaluation Patient Details Name: Mark Bass MRN: [id number] DOB: April 07, 1961 Today's Date: 10/11/2020    History of Present Illness Mark Bass is a 60 y.o. male right handed MPHx as reviwed below, HTN who stopped taking antihypertensive medications some time ago for unknown reason got up feeling dizzy with a headache, went to the bathroom and collapsed. CT/MRI:  hemorrhagic stroke in the right basal ganglia   Clinical Impression   This 60 yo male admitted with above presents to acute OT with PLOF of being totally independent with basic ADLs, IADLs, and working full time at [employer]. Currently he has left visual inattention and decreased balance thus affecting his safety and independence with basic ADLs, IADLs, and working. He will benefit from acute OT with follow up on CIR to give him the best and quickest path back to personal and work like. We will continue to follow.    Follow Up Recommendations  CIR;Supervision - Intermittent    Equipment Recommendations  Tub/shower seat       Precautions / Restrictions Precautions Precautions: Fall Restrictions Weight Bearing Restrictions: No      Mobility Bed Mobility Overal bed mobility: Needs Assistance Bed Mobility: Supine to Sit     Supine to sit: Min guard     General bed mobility comments: min guard for safety due to poor awareness of left side (lines and environment during transition to EOB)    Transfers Overall transfer level: Needs assistance Equipment used: 1 person hand held assist Transfers: Sit to/from Stand Sit to Stand: Min assist         General transfer comment: hands on assist required for instability during all transitions    Balance Overall balance assessment: Needs assistance Sitting-balance support: Feet supported Sitting balance-Leahy Scale: Good     Standing balance support: During functional activity Standing balance-Leahy Scale: Poor Standing balance comment:  instability noted, poor proprioceptive awareness at times, assist for stability                           ADL either performed or assessed with clinical judgement   ADL Overall ADL's : Needs assistance/impaired Eating/Feeding: Set up;Sitting   Grooming: Minimal assistance Grooming Details (indicate cue type and reason): for standing balance Upper Body Bathing: Set up;Supervision/ safety;Sitting   Lower Body Bathing: Minimal assistance;Sit to/from stand   Upper Body Dressing : Supervision/safety;Set up;Sitting   Lower Body Dressing: Minimal assistance;Sit to/from stand   Toilet Transfer: Minimal assistance;Ambulation Toilet Transfer Details (indicate cue type and reason): No AD Toileting- Clothing Manipulation and Hygiene: Minimal assistance;Sit to/from stand         General ADL Comments: RN (My) interpreted for us--she made him aware he is not safe to drive     Vision Baseline Vision/History: No visual deficits Patient Visual Report: No change from baseline Vision Assessment?: Yes Eye Alignment: Within Functional Limits Ocular Range of Motion: Within Functional Limits Alignment/Gaze Preference: Within Defined Limits Tracking/Visual Pursuits: Impaired - to be further tested in functional context Saccades: Additional eye shifts occurred during testing Visual Fields: No apparent deficits Additional Comments: Decreased ability to follow smoothly for tracking during entire process; left inattention            Pertinent Vitals/Pain Pain Assessment: No/denies pain     Hand Dominance Right   Extremity/Trunk Assessment Upper Extremity Assessment Upper Extremity Assessment: LUE deficits/detail LUE Deficits / Details: Decreased coordination, inattention LUE Sensation: decreased light touch;decreased proprioception LUE Coordination:  decreased fine motor;decreased gross motor   Lower Extremity Assessment Lower Extremity Assessment: LLE deficits/detail (Strength  WFL, limited sensation left LE) LLE Sensation: decreased light touch;decreased proprioception LLE Coordination: decreased fine motor;decreased gross motor       Communication Communication Communication: Prefers language other than English   Cognition Arousal/Alertness: Awake/alert Behavior During Therapy: WFL for tasks assessed/performed Overall Cognitive Status: Difficult to assess                                                Home Living Family/patient expects to be discharged to:: Private residence Living Arrangements: Spouse/significant other;Children Available Help at Discharge: Family Type of Home: Apartment Home Access: Level entry     Home Layout: One level     Bathroom Shower/Tub: Chief Strategy Officer: Standard     Home Equipment: None          Prior Functioning/Environment Level of Independence: Independent        Comments: drives fork lift for [employer]        OT Problem List: Impaired balance (sitting and/or standing);Impaired vision/perception;Decreased coordination;Decreased safety awareness;Impaired UE functional use      OT Treatment/Interventions: Self-care/ADL training;DME and/or AE instruction;Patient/family education;Balance training;Visual/perceptual remediation/compensation;Therapeutic activities    OT Goals(Current goals can be found in the care plan section) Acute Rehab OT Goals Patient Stated Goal: to get back to work OT Goal Formulation: With patient Time For Goal Achievement: 10/25/20 Potential to Achieve Goals: Good  OT Frequency: Min 2X/week           Co-evaluation   Reason for Co-Treatment: Necessary to address cognition/behavior during functional activity;Complexity of the patient's impairments (multi-system involvement);For patient/therapist safety;To address functional/ADL transfers PT goals addressed during session: Mobility/safety with mobility OT goals addressed during session: ADL's and  self-care;Strengthening/ROM      AM-PAC OT "6 Clicks" Daily Activity     Outcome Measure Help from another person eating meals?: A Little Help from another person taking care of personal grooming?: A Little Help from another person toileting, which includes using toliet, bedpan, or urinal?: A Little Help from another person bathing (including washing, rinsing, drying)?: A Little Help from another person to put on and taking off regular upper body clothing?: A Little Help from another person to put on and taking off regular lower body clothing?: A Little 6 Click Score: 18   End of Session Nurse Communication:  (RN in room with [place] seeing his balance deficits when he was up and about; she was already aware of this as well)  Activity Tolerance: Patient tolerated treatment well Patient left: in chair;with call bell/phone within reach;with chair alarm set  OT Visit Diagnosis: Unsteadiness on feet (R26.81);Other abnormalities of gait and mobility (R26.89);Other symptoms and signs involving cognitive function;Low vision, both eyes (H54.2)                Time: 4742-5956 OT Time Calculation (min): 20 min Charges:  OT General Charges $OT Visit: 1 Visit OT Evaluation $OT Eval Moderate Complexity: 1 Mod  Ignacia Palma, OTR/L Acute [employer] Pager [phone] Office [phone]     Evette Georges 10/11/2020, 9:37 AM

## 2020-10-11 NOTE — Consult Note (Signed)
Physical Medicine and Rehabilitation Consult Reason for Consult: Dizziness with headache Referring Physician: Dr.Xu   HPI: Mark Bass is a 60 y.o. right-handed male with history of hypertension who stopped taking his antihypertensive medication sometime ago. Per chart review lives with spouse and children. 1 level apartment. 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 Presented 10/10/2020 with dizziness and headache and collapsed in the bathroom. He was found down by his wife. CT of the head showed a 9 cm acute hematoma at the posterior right putamen and adjacent white matter. CT angiogram of head and neck no spot sign of or visible vascular lesion underlying the parenchymal hemorrhage. Echocardiogram with ejection fraction of 55 to 60% no wall motion abnormalities grade 1 diastolic dysfunction. Admission chemistries alcohol negative, sodium 133, potassium 3.1, glucose 168, WBC 12,200, RBC 6.60, hemoglobin A1c 6.6. MRI follow-up showed expected appearance of right basal ganglia hematoma with rim of edema. No evident progression or CSF extension. Neurology follow-up currently with conservative care. He is tolerating a regular diet. Initially on Cleviprex for blood pressure control.  Therapy evaluations completed with recommendations of physical medicine rehab consult due to decreased functional mobility and need for physical assistance. He currently has no complaints. Wife is at bedside.    Review of Systems  Unable to perform ROS: Language   Past Medical History:  Diagnosis Date  . Hypertension    Past Surgical History:  Procedure Laterality Date  . NECK SURGERY     Family History  Family history unknown: Yes   Social History:  reports that he has quit smoking. He has never used smokeless tobacco. He reports previous alcohol use. He reports that he does not use drugs. Allergies: No Known Allergies No medications prior to admission.    Home: Home Living Family/patient  expects to be discharged to:: Private residence Living Arrangements: Spouse/significant other,Children Available Help at Discharge: Family Type of Home: Apartment Home Access: Level entry Home Layout: One level Bathroom Shower/Tub: Engineer, manufacturing systems: Standard Home Equipment: None  Functional History: Prior Function Level of Independence: Independent Comments: drives fork lift for [employer] Functional Status:  Mobility: Bed Mobility Overal bed mobility: Needs Assistance Bed Mobility: Supine to Sit Supine to sit: Min guard General bed mobility comments: min guard for safety due to poor awareness of left side (lines and environment during transition to EOB) Transfers Overall transfer level: Needs assistance Equipment used: 1 person hand held assist Transfers: Sit to/from Stand Sit to Stand: Min assist General transfer comment: hands on assist required for instability during all transitions Ambulation/Gait Ambulation/Gait assistance: Min assist Gait Distance (Feet): 18 Feet Assistive device: 1 person hand held assist Gait Pattern/deviations: Decreased step length - left,Ataxic General Gait Details: decreased awareness of left side, noted inattention to the environment. Increased risk of fall throughout. inability to navigate environment safely without assist Gait velocity: decreased and guarded Gait velocity interpretation: <1.8 ft/sec, indicate of risk for recurrent falls    ADL: ADL Overall ADL's : Needs assistance/impaired Eating/Feeding: Set up,Sitting Grooming: Minimal assistance Grooming Details (indicate cue type and reason): for standing balance Upper Body Bathing: Set up,Supervision/ safety,Sitting Lower Body Bathing: Minimal assistance,Sit to/from stand Upper Body Dressing : Supervision/safety,Set up,Sitting Lower Body Dressing: Minimal assistance,Sit to/from stand Toilet Transfer: Minimal Designer, television/film set Details (indicate cue type  and reason): No AD Toileting- Clothing Manipulation and Hygiene: Minimal assistance,Sit to/from stand General ADL Comments: RN (My) interpreted for us--she made him aware he is not safe to  drive  Cognition: Cognition Overall Cognitive Status: Difficult to assess Orientation Level: Oriented X4 Cognition Arousal/Alertness: Awake/alert Behavior During Therapy: WFL for tasks assessed/performed Overall Cognitive Status: Difficult to assess Difficult to assess due to: Non-English speaking  Blood pressure (!) 143/85, pulse 68, temperature 98.5 F (36.9 C), temperature source Oral, resp. rate 18, SpO2 95 %. Physical Exam Gen: no distress, normal appearing HEENT: oral mucosa pink and moist, NCAT Cardio: Reg rate Chest: normal effort, normal rate of breathing Abd: soft, non-distended Ext: no edema Psych: pleasant, normal affect Skin: intact Neurological:     Comments: Patient is alert and oriented x3 and in no acute distress. There is a language barrier with assistance interpreting by son. Patient was able to provide his name and age. Follows commands. 5/5 strength throughout   Results for orders placed or performed during the hospital encounter of 10/10/20 (from the past 24 hour(s))  CBC     Status: Abnormal   Collection Time: 10/12/20 12:12 AM  Result Value Ref Range   WBC 10.9 (H) 4.0 - 10.5 K/uL   RBC 6.79 (H) 4.22 - 5.81 MIL/uL   Hemoglobin 14.2 13.0 - 17.0 g/dL   HCT 13.0 86.5 - 78.4 %   MCV 67.0 (L) 80.0 - 100.0 fL   MCH 20.9 (L) 26.0 - 34.0 pg   MCHC 31.2 30.0 - 36.0 g/dL   RDW 69.6 (H) 29.5 - 28.4 %   Platelets 223 150 - 400 K/uL   nRBC 0.0 0.0 - 0.2 %  Basic metabolic panel     Status: Abnormal   Collection Time: 10/12/20 12:12 AM  Result Value Ref Range   Sodium 136 135 - 145 mmol/L   Potassium 3.7 3.5 - 5.1 mmol/L   Chloride 104 98 - 111 mmol/L   CO2 24 22 - 32 mmol/L   Glucose, Bld 120 (H) 70 - 99 mg/dL   BUN 17 8 - 23 mg/dL   Creatinine, Ser 1.32 (H) 0.61 -  1.24 mg/dL   Calcium 9.1 8.9 - 44.0 mg/dL   GFR, Estimated >10 >27 mL/min   Anion gap 8 5 - 15  Phosphorus     Status: None   Collection Time: 10/12/20 12:12 AM  Result Value Ref Range   Phosphorus 2.5 2.5 - 4.6 mg/dL  Troponin I (High Sensitivity)     Status: Abnormal   Collection Time: 10/12/20 12:12 AM  Result Value Ref Range   Troponin I (High Sensitivity) 22 (H) <18 ng/L  Troponin I (High Sensitivity)     Status: Abnormal   Collection Time: 10/12/20  3:08 AM  Result Value Ref Range   Troponin I (High Sensitivity) 22 (H) <18 ng/L   CT ANGIO HEAD W OR WO CONTRAST  Result Date: 10/10/2020 CLINICAL DATA:  Follow-up parenchymal hemorrhage EXAM: CT ANGIOGRAPHY HEAD AND NECK TECHNIQUE: Multidetector CT imaging of the head and neck was performed using the standard protocol during bolus administration of intravenous contrast. Multiplanar CT image reconstructions and MIPs were obtained to evaluate the vascular anatomy. Carotid stenosis measurements (when applicable) are obtained utilizing NASCET criteria, using the distal internal carotid diameter as the denominator. CONTRAST:  75mL OMNIPAQUE IOHEXOL 350 MG/ML SOLN COMPARISON:  None. FINDINGS: CTA NECK FINDINGS Aortic arch: Normal arch.  Three vessel branching. Right carotid system: Low-density atheromatous type wall thickening of the common carotid. No stenosis, ulceration, or beading. Left carotid system: Low-density atheromatous type wall thickening of the common carotid. No stenosis, ulceration, or beading. Vertebral arteries: No proximal subclavian  stenosis. Codominant vertebral arteries which are smooth and widely patent to the dura. Skeleton: No acute or aggressive finding. Other neck: Nodular and bandlike scarring in the suboccipital neck. Upper chest: Negative Review of the MIP images confirms the above findings CTA HEAD FINDINGS Anterior circulation: No proximal occlusion, aneurysm, or vascular malformation. Widespread atheromatous changes to  ACA and MCA distributions. The worst stenosis is at the right A2/A3 level. Right MCA stenosis is notable for moderate M1 and M2 narrowings. Posterior circulation: No proximal occlusion, aneurysm, or vascular malformation. Moderate atheromatous type narrowing at the right vertebrobasilar junction and at the mid basilar. Left more than right PCA atheromatous irregularity and stenosis. Venous sinuses: Not opacified in the arterial phase Anatomic variants: None significant. Other: No spot sign seen at the parenchymal hemorrhage on the right which has a stable shape. Review of the MIP images confirms the above findings IMPRESSION: 1. No spot sign of or visible vascular lesion underlying the parenchymal hemorrhage. 2. Advanced intracranial atherosclerosis. Electronically Signed   By: Marnee Spring M.D.   On: 10/10/2020 09:17   DG Chest 1 View  Result Date: 10/10/2020 CLINICAL DATA:  Hemorrhagic stroke EXAM: CHEST  1 VIEW COMPARISON:  01/07/2019 FINDINGS: Cardiomegaly. Stable mediastinal contours. There is no edema, consolidation, effusion, or pneumothorax. Artifact from EKG leads. IMPRESSION: No acute finding. Chronic cardiomegaly. Electronically Signed   By: Marnee Spring M.D.   On: 10/10/2020 06:26   CT ANGIO NECK W OR WO CONTRAST  Result Date: 10/10/2020 CLINICAL DATA:  Follow-up parenchymal hemorrhage EXAM: CT ANGIOGRAPHY HEAD AND NECK TECHNIQUE: Multidetector CT imaging of the head and neck was performed using the standard protocol during bolus administration of intravenous contrast. Multiplanar CT image reconstructions and MIPs were obtained to evaluate the vascular anatomy. Carotid stenosis measurements (when applicable) are obtained utilizing NASCET criteria, using the distal internal carotid diameter as the denominator. CONTRAST:  75mL OMNIPAQUE IOHEXOL 350 MG/ML SOLN COMPARISON:  None. FINDINGS: CTA NECK FINDINGS Aortic arch: Normal arch.  Three vessel branching. Right carotid system: Low-density  atheromatous type wall thickening of the common carotid. No stenosis, ulceration, or beading. Left carotid system: Low-density atheromatous type wall thickening of the common carotid. No stenosis, ulceration, or beading. Vertebral arteries: No proximal subclavian stenosis. Codominant vertebral arteries which are smooth and widely patent to the dura. Skeleton: No acute or aggressive finding. Other neck: Nodular and bandlike scarring in the suboccipital neck. Upper chest: Negative Review of the MIP images confirms the above findings CTA HEAD FINDINGS Anterior circulation: No proximal occlusion, aneurysm, or vascular malformation. Widespread atheromatous changes to ACA and MCA distributions. The worst stenosis is at the right A2/A3 level. Right MCA stenosis is notable for moderate M1 and M2 narrowings. Posterior circulation: No proximal occlusion, aneurysm, or vascular malformation. Moderate atheromatous type narrowing at the right vertebrobasilar junction and at the mid basilar. Left more than right PCA atheromatous irregularity and stenosis. Venous sinuses: Not opacified in the arterial phase Anatomic variants: None significant. Other: No spot sign seen at the parenchymal hemorrhage on the right which has a stable shape. Review of the MIP images confirms the above findings IMPRESSION: 1. No spot sign of or visible vascular lesion underlying the parenchymal hemorrhage. 2. Advanced intracranial atherosclerosis. Electronically Signed   By: Marnee Spring M.D.   On: 10/10/2020 09:17   MR BRAIN WO CONTRAST  Result Date: 10/11/2020 CLINICAL DATA:  Uncontrolled hypertension with right basal ganglia hemorrhage. EXAM: MRI HEAD WITHOUT CONTRAST TECHNIQUE: Multiplanar, multiecho pulse sequences of the  brain and surrounding structures were obtained without intravenous contrast. COMPARISON:  CT and CTA from yesterday FINDINGS: Brain: Known hematoma centered at the right basal ganglia with rim of edema. Dimensions are  approximately 3.2 x 1.8 cm on axial T2 weighted imaging, similar to prior. No visible intraventricular extension. There is a background of chronic small vessel ischemia with FLAIR hyperintensity and remote micro hemorrhages in the brainstem and deep gray matter. No hydrocephalus, collection, or evidence of parenchymal mass. Vascular: Major flow voids are preserved Skull and upper cervical spine: Normal marrow signal. Sinuses/Orbits: Negative Other: Hypertrophic appearance of the palatine tonsils which is only partially covered and symmetric. IMPRESSION: 1. Expected appearance of right basal ganglia hematoma with rim of edema. No evident progression or CSF extension. 2. Background of chronic white matter disease with remote micro hemorrhages in a hypertensive pattern. 3. Hypertrophic palatine tonsils, notable for age. Electronically Signed   By: Marnee Spring M.D.   On: 10/11/2020 05:32   ECHOCARDIOGRAM COMPLETE  Result Date: 10/10/2020    ECHOCARDIOGRAM REPORT   Patient Name:   YUUKI SKEENS  Date of Exam: 10/10/2020 Medical Rec #:  [id number]  Height:       60.0 in Accession #:    [id number] Weight:       140.0 lb Date of Birth:  [birth year]   BSA:          1.604 m Patient Age:    60 years   BP:           136/80 mmHg Patient Gender: M          HR:           101 bpm. Exam Location:  Inpatient Procedure: 2D Echo, Cardiac Doppler and Color Doppler Indications:    Stroke I163.9  History:        Patient has no prior history of Echocardiogram examinations.                 Risk Factors:Hypertension. COVID-19.  Sonographer:    Elmarie Shiley Dance Referring Phys: [id number] HUNTER J COLLINS IMPRESSIONS  1. Left ventricular ejection fraction, by estimation, is 55 to 60%. The left ventricle has normal function. The left ventricle has no regional wall motion abnormalities. Left ventricular diastolic parameters are consistent with Grade I diastolic dysfunction (impaired relaxation).  2. Right ventricular systolic function is normal. The  right ventricular size is normal.  3. Left atrial size was mildly dilated.  4. The mitral valve is normal in structure. Trivial mitral valve regurgitation. No evidence of mitral stenosis.  5. The aortic valve is normal in structure. Aortic valve regurgitation is not visualized. No aortic stenosis is present.  6. Aortic dilatation noted. There is mild dilatation of the aortic root, measuring 39 mm.  7. The inferior vena cava is normal in size with greater than 50% respiratory variability, suggesting right atrial pressure of 3 mmHg. FINDINGS  Left Ventricle: Left ventricular ejection fraction, by estimation, is 55 to 60%. The left ventricle has normal function. The left ventricle has no regional wall motion abnormalities. The left ventricular internal cavity size was normal in size. There is  no left ventricular hypertrophy. Left ventricular diastolic parameters are consistent with Grade I diastolic dysfunction (impaired relaxation). Right Ventricle: The right ventricular size is normal. No increase in right ventricular wall thickness. Right ventricular systolic function is normal. Left Atrium: Left atrial size was mildly dilated. Right Atrium: Right atrial size was normal in size. Pericardium: There is no evidence of pericardial  effusion. Mitral Valve: The mitral valve is normal in structure. Trivial mitral valve regurgitation. No evidence of mitral valve stenosis. Tricuspid Valve: The tricuspid valve is normal in structure. Tricuspid valve regurgitation is not demonstrated. No evidence of tricuspid stenosis. Aortic Valve: The aortic valve is normal in structure. Aortic valve regurgitation is not visualized. Aortic regurgitation PHT measures 295 msec. No aortic stenosis is present. Pulmonic Valve: The pulmonic valve was normal in structure. Pulmonic valve regurgitation is not visualized. No evidence of pulmonic stenosis. Aorta: Aortic dilatation noted. There is mild dilatation of the aortic root, measuring 39 mm.  Venous: The inferior vena cava is normal in size with greater than 50% respiratory variability, suggesting right atrial pressure of 3 mmHg. IAS/Shunts: No atrial level shunt detected by color flow Doppler.  LEFT VENTRICLE PLAX 2D LVIDd:         4.80 cm LVIDs:         3.30 cm LV PW:         1.50 cm LV IVS:        1.00 cm LVOT diam:     2.10 cm LV SV:         74 LV SV Index:   46 LVOT Area:     3.46 cm  RIGHT VENTRICLE             IVC RV Basal diam:  2.50 cm     IVC diam: 1.80 cm RV S prime:     17.00 cm/s TAPSE (M-mode): 2.5 cm LEFT ATRIUM             Index       RIGHT ATRIUM           Index LA diam:        4.20 cm 2.62 cm/m  RA Area:     14.40 cm LA Vol (A2C):   63.8 ml 39.77 ml/m RA Volume:   34.60 ml  21.57 ml/m LA Vol (A4C):   49.6 ml 30.92 ml/m LA Biplane Vol: 56.4 ml 35.16 ml/m  AORTIC VALVE LVOT Vmax:   96.70 cm/s LVOT Vmean:  67.800 cm/s LVOT VTI:    0.213 m AI PHT:      295 msec  AORTA Ao Root diam: 3.10 cm Ao Asc diam:  3.90 cm  SHUNTS Systemic VTI:  0.21 m Systemic Diam: 2.10 cm Donato SchultzMark Skains MD Electronically signed by Donato SchultzMark Skains MD Signature Date/Time: 10/10/2020/2:06:39 PM    Final     Assessment/Plan: Diagnosis: ICH of right basal ganglia 1. Does the need for close, 24 hr/day medical supervision in concert with the patient's rehab needs make it unreasonable for this patient to be served in a less intensive setting? Yes 2. Co-Morbidities requiring supervision/potential complications:  1. Hyperlipidemia: LDL reviewed- 110. No statin for now due to ICH 2. Hypertensive emergency: continue BP medications 3. Previous cigarette smoker: provide counseling 4. Leukocytosis: trending downward 5. Hypokalemia: requiring supplementation 3. Due to bladder management, bowel management, safety, skin/wound care, disease management, medication administration, pain management and patient education, does the patient require 24 hr/day rehab nursing? Yes 4. Does the patient require coordinated care of a  physician, rehab nurse, therapy disciplines of PT, OT to address physical and functional deficits in the context of the above medical diagnosis(es)? Yes Addressing deficits in the following areas: balance, endurance, locomotion, strength, transferring, bowel/bladder control, bathing, dressing, feeding, grooming, toileting and psychosocial support 5. Can the patient actively participate in an intensive therapy program of at least 3 hrs  of therapy per day at least 5 days per week? Yes 6. The potential for patient to make measurable gains while on inpatient rehab is excellent 7. Anticipated functional outcomes upon discharge from inpatient rehab are modified independent  with PT, modified independent with OT, independent with SLP. 8. Estimated rehab length of stay to reach the above functional goals is: 5-7 days 9. Anticipated discharge destination: Home 10. Overall Rehab/Functional Prognosis: excellent  RECOMMENDATIONS: This patient's condition is appropriate for continued rehabilitative care in the following setting: CIR Patient has agreed to participate in recommended program. Yes Note that insurance prior authorization may be required for reimbursement for recommended care.  Comment: Thank you for this consult. Admission coordinator to follow.   I have personally performed a face to face diagnostic evaluation, including, but not limited to relevant history and physical exam findings, of this patient and developed relevant assessment and plan.  Additionally, I have reviewed and concur with the physician assistant's documentation above.  Sula Soda, MD  Mcarthur Rossetti Angiulli, PA-C 10/12/2020

## 2020-10-11 NOTE — Progress Notes (Addendum)
STROKE TEAM PROGRESS NOTE   INTERVAL HISTORY His wife and RN is at the bedside. RN serves as interpreter as there is no formal interpreter service available for dialect. He denies h/a this am. BP now better controlled on POs and he is off cleviprex gtt. Plan to transfer to floor today.  Vitals:   10/11/20 0900 10/11/20 1000 10/11/20 1100 10/11/20 1200  BP: (!) 152/100 127/87 (!) 156/99 (!) 156/99  Pulse: 80 84 83 69  Resp: 18 17 (!) 26 20  Temp:    98.6 F (37 C)  TempSrc:    Oral  SpO2: 96% 96% 98% 97%   CBC:  Recent Labs  Lab 10/10/20 0502 10/10/20 0507 10/11/20 0154  WBC 12.2*  --  9.4  NEUTROABS 6.4  --   --   HGB 14.2 16.7 14.3  HCT 44.8 49.0 44.6  MCV 67.9*  --  66.0*  PLT 221  --  220   Basic Metabolic Panel:  Recent Labs  Lab 10/10/20 0502 10/10/20 0507 10/10/20 0603 10/11/20 0154  NA 133* 137  --  139  K 3.1* 3.1*  --  3.2*  CL 99 99  --  104  CO2 22  --   --  25  GLUCOSE 168* 170*  --  121*  BUN 17 20  --  13  CREATININE 1.00 0.90  --  0.98  CALCIUM 8.9  --   --  9.2  MG  --   --  1.8  --   PHOS  --   --  2.0*  --    Lipid Panel:  Recent Labs  Lab 10/10/20 0603  CHOL 194  TRIG 165*  HDL 51  CHOLHDL 3.8  VLDL 33  LDLCALC 664*   HgbA1c:  Recent Labs  Lab 10/10/20 0603  HGBA1C 6.6*   Urine Drug Screen:  Recent Labs  Lab 10/10/20 0827  LABOPIA NONE DETECTED  COCAINSCRNUR NONE DETECTED  LABBENZ NONE DETECTED  AMPHETMU NONE DETECTED  THCU NONE DETECTED  LABBARB NONE DETECTED    Alcohol Level  Recent Labs  Lab 10/10/20 0502  ETH <10    IMAGING past 24 hours MR BRAIN WO CONTRAST  Result Date: 10/11/2020 CLINICAL DATA:  Uncontrolled hypertension with right basal ganglia hemorrhage. EXAM: MRI HEAD WITHOUT CONTRAST TECHNIQUE: Multiplanar, multiecho pulse sequences of the brain and surrounding structures were obtained without intravenous contrast. COMPARISON:  CT and CTA from yesterday FINDINGS: Brain: Known hematoma centered at the  right basal ganglia with rim of edema. Dimensions are approximately 3.2 x 1.8 cm on axial T2 weighted imaging, similar to prior. No visible intraventricular extension. There is a background of chronic small vessel ischemia with FLAIR hyperintensity and remote micro hemorrhages in the brainstem and deep gray matter. No hydrocephalus, collection, or evidence of parenchymal mass. Vascular: Major flow voids are preserved Skull and upper cervical spine: Normal marrow signal. Sinuses/Orbits: Negative Other: Hypertrophic appearance of the palatine tonsils which is only partially covered and symmetric. IMPRESSION: 1. Expected appearance of right basal ganglia hematoma with rim of edema. No evident progression or CSF extension. 2. Background of chronic white matter disease with remote micro hemorrhages in a hypertensive pattern. 3. Hypertrophic palatine tonsils, notable for age. Electronically Signed   By: Marnee Spring M.D.   On: 10/11/2020 05:32    PHYSICAL EXAM Appears well-developed and well-nourished. No acute distress Psych: Affect appropriate to situation Eyes: + scleral injection HENT: No OP obstrucion Head: Normocephalic.  Cardiovascular: Tachycardic with regular  rhythm.  Respiratory: Effort normal and breath sounds normal to anterior ascultation GI: Soft.  No distension. There is no tenderness.  Skin: WDI  Neuro: Mental Status: He is awake, alert, oriented to person, place, year, and situation. There was a language barrier, but per RN, his speech is fluent without aphasia. He does have some slight neglect. CN: Visual Fields are full. Pupils are equal, round, and reactive to light. EOMI without ptosis or diploplia. Facial sensation is symmetric to temperature Face left lower weakness. Hearing is intact to voice. Uvula midline, palate elevates symmetrically. Shoulder shrug is symmetric.Tongue is midline  Motor: Tone is normal. Bulk is normal. 5/5 strength was present in all four  extremities. Sensation: is symmetric to light touch and temperature in the arms and legs. Deep Tendon Reflexes: 2+ and symmetric in the biceps and patellae. Toes are downgoing bilaterally. Coordination: FNF and HKS are intact bilaterally. Gait: Deferred  ASSESSMENT/PLAN [name] is a 60 y.o. male with history of HTN and medication non-compliance. On 3/5 pt awoke very early at ~0400 to go to the bathroom and became dizzy with headache. He had difficulty standing and left facial droop. CTH showed Right BG ICH. ICH score 1.  ICH: Right BG, likely due to HTN etiology.   CT head - right BG ICH, ~ 10cc  CTA head & neck: severe athro disease, but no underlying AVM or etiology for bleed. No Spot sign.   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 No underlying mass.  EF 55-60%  LDL 110  HgbA1c 6.6  UDS negative  VTE prophylaxis - SCDs  No antithrombotics PTA, now not on antithrombotics due to ICH  Therapy recommendation - CIR  Deposition - pending  Hypertensive emergency  Home meds:  None  Stable on the high side . off Cleviprex  . On amlodipine, losartan and metoprolol now that he is taking po . Long-term BP goal normotensive  Hyperlipidemia  Home meds: none  LDL 110, goal < 70  No statin for now due to ICH  Consider statin on discharge  Other Stroke Risk Factors  Asian decent  Previous cigarette smoker  Other acute issues  Leukocytosis, WBC 12.2->9.4  Hypokalemia - K 3.0->3.2, supplement  Hospital day # 1  Desiree Metzger-Cihelka, ARNP-C, ANVP-BC Pager: [phone] Stroke Neurology 10/11/2020 2:57 PM  ATTENDING NOTE: I reviewed above note and agree with the assessment and plan. Pt was seen and examined.   Pt wife at bedside. He is sitting in chair, off cleviprex. Now on 3 BP meds. Neuro stable, no acute event overnight. Still has mild left facial droop and left hand dexterity difficulty. PT OT  recommend CIR. K 3.2, give supplement. Will transfer out of ICU, hospitalist service will take over in am.  For detailed assessment and plan, please refer to above as I have made changes wherever appropriate.   Marvel Plan, MD PhD Stroke Neurology 10/11/2020 8:57 PM    To contact Stroke Continuity provider, please refer to [email or web address]. After hours, contact General Neurology

## 2020-10-11 NOTE — Evaluation (Signed)
Physical Therapy Evaluation Patient Details Name: Mark Bass MRN: [id number] DOB: 1960/12/04 Today's Date: 10/11/2020   History of Present Illness  Mark Bass is a 60 y.o. male right handed MPHx as reviwed below, HTN who stopped taking antihypertensive medications some time ago for unknown reason got up feeling dizzy with a headache, went to the bathroom and collapsed. 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 Patient demonstrates deficits in functional mobility as indicated below. Will benefit from continued skilled PT to address deficits and maximize function. Will see as indicated and progress as tolerated.  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  At this time, patient demonstrating left inattention, poor insight and awareness of deficits, and noted balance deficits/instability due to sensory deficits.  Patient's family works during the day and patient would need to be independent for safe discharge. Given the deficits as well as inattention and awareness issues, feel patient would benefit from comprehensive inpatient therapies to maximize ability to recover function and return to prior levels.     Follow Up Recommendations CIR    Equipment Recommendations       Recommendations for Other Services Rehab consult     Precautions / Restrictions Precautions Precautions: Fall      Mobility  Bed Mobility Overal bed mobility: Needs Assistance Bed Mobility: Supine to Sit     Supine to sit: Min guard     General bed mobility comments: min guard for safety due to poor awareness of left side (lines and environment during transition to EOB)    Transfers Overall transfer level: Needs assistance Equipment used: 1 person hand held assist Transfers: Sit to/from Stand Sit to Stand: Min assist         General transfer comment: hands on assist required for instability during all  transitions  Ambulation/Gait Ambulation/Gait assistance: Min assist Gait Distance (Feet): 18 Feet Assistive device: 1 person hand held assist Gait Pattern/deviations: Decreased step length - left;Ataxic Gait velocity: decreased and guarded Gait velocity interpretation: <1.8 ft/sec, indicate of risk for recurrent falls General Gait Details: decreased awareness of left side, noted inattention to the environment. Increased risk of fall throughout. inability to navigate environment safely without assist  Stairs            Wheelchair Mobility    Modified Rankin (Stroke Patients Only)       Balance Overall balance assessment: Needs assistance Sitting-balance support: Feet supported Sitting balance-Leahy Scale: Good     Standing balance support: During functional activity Standing balance-Leahy Scale: Poor Standing balance comment: instability noted, poor proprioceptive awareness at times, assist for stability                             Pertinent Vitals/Pain Pain Assessment: No/denies pain    Home Living Family/patient expects to be discharged to:: Private residence Living Arrangements: Spouse/significant other;Children Available Help at Discharge: Family Type of Home: Apartment Home Access: Level entry     Home Layout: One level Home Equipment: None      Prior Function Level of Independence: Independent         Comments: drives fork lift for [employer]     Hand Dominance   Dominant Hand: Right    Extremity/Trunk Assessment   Upper Extremity Assessment Upper Extremity Assessment: LUE deficits/detail LUE Sensation: decreased light touch;decreased proprioception LUE Coordination: decreased fine motor;decreased gross motor  Lower Extremity Assessment Lower Extremity Assessment: LLE deficits/detail (Strength WFL, limited sensation left LE) LLE Sensation: decreased light touch;decreased proprioception LLE Coordination: decreased fine  motor;decreased gross motor       Communication   Communication: Prefers language other than English  Cognition Arousal/Alertness: Awake/alert Behavior During Therapy: WFL for tasks assessed/performed Overall Cognitive Status: Difficult to assess                                        General Comments      Exercises     Assessment/Plan    PT Assessment Patient needs continued PT services  PT Problem List Decreased balance;Decreased mobility;Decreased coordination;Decreased safety awareness;Impaired sensation       PT Treatment Interventions Gait training;Functional mobility training;Therapeutic activities;Therapeutic exercise;Balance training;Neuromuscular re-education;Patient/family education    PT Goals (Current goals can be found in the Care Plan section)  Acute Rehab PT Goals Patient Stated Goal: to get back to work PT Goal Formulation: With patient Time For Goal Achievement: 10/25/20 Potential to Achieve Goals: Good    Frequency Min 4X/week   Barriers to discharge Decreased caregiver support      Co-evaluation PT/OT/SLP Co-Evaluation/Treatment: Yes Reason for Co-Treatment: Complexity of the patient's impairments (multi-system involvement);Necessary to address cognition/behavior during functional activity;For patient/therapist safety;To address functional/ADL transfers PT goals addressed during session: Mobility/safety with mobility OT goals addressed during session: ADL's and self-care       AM-PAC PT "6 Clicks" Mobility  Outcome Measure Help needed turning from your back to your side while in a flat bed without using bedrails?: A Little Help needed moving from lying on your back to sitting on the side of a flat bed without using bedrails?: A Little Help needed moving to and from a bed to a chair (including a wheelchair)?: A Little Help needed standing up from a chair using your arms (e.g., wheelchair or bedside chair)?: A Little Help needed  to walk in hospital room?: A Lot Help needed climbing 3-5 steps with a railing? : A Lot 6 Click Score: 16    End of Session   Activity Tolerance: Patient tolerated treatment well Patient left: in chair;with call bell/phone within reach;with chair alarm set Nurse Communication: Mobility status PT Visit Diagnosis: Unsteadiness on feet (R26.81);Other abnormalities of gait and mobility (R26.89);Other symptoms and signs involving the nervous system (R29.898)    Time: 1950-9326 PT Time Calculation (min) (ACUTE ONLY): 23 min   Charges:   PT Evaluation $PT Eval Moderate Complexity: 1 Mod          Charlotte Crumb, PT DPT  Board Certified Neurologic Specialist Acute Rehabilitation Services Office [phone]   Fabio Asa 10/11/2020, 8:44 AM

## 2020-10-12 DIAGNOSIS — I10 Essential (primary) hypertension: Secondary | ICD-10-CM

## 2020-10-12 DIAGNOSIS — I619 Nontraumatic intracerebral hemorrhage, unspecified: Secondary | ICD-10-CM

## 2020-10-12 LAB — CBC
HCT: 45.5 % (ref 39.0–52.0)
Hemoglobin: 14.2 g/dL (ref 13.0–17.0)
MCH: 20.9 pg — ABNORMAL LOW (ref 26.0–34.0)
MCHC: 31.2 g/dL (ref 30.0–36.0)
MCV: 67 fL — ABNORMAL LOW (ref 80.0–100.0)
Platelets: 223 10*3/uL (ref 150–400)
RBC: 6.79 MIL/uL — ABNORMAL HIGH (ref 4.22–5.81)
RDW: 16.8 % — ABNORMAL HIGH (ref 11.5–15.5)
WBC: 10.9 10*3/uL — ABNORMAL HIGH (ref 4.0–10.5)
nRBC: 0 % (ref 0.0–0.2)

## 2020-10-12 LAB — BASIC METABOLIC PANEL
Anion gap: 8 (ref 5–15)
BUN: 17 mg/dL (ref 8–23)
CO2: 24 mmol/L (ref 22–32)
Calcium: 9.1 mg/dL (ref 8.9–10.3)
Chloride: 104 mmol/L (ref 98–111)
Creatinine, Ser: 1.26 mg/dL — ABNORMAL HIGH (ref 0.61–1.24)
GFR, Estimated: >60 mL/min (ref >60)
Glucose, Bld: 120 mg/dL — ABNORMAL HIGH (ref 70–99)
Potassium: 3.7 mmol/L (ref 3.5–5.1)
Sodium: 136 mmol/L (ref 135–145)

## 2020-10-12 LAB — TROPONIN I (HIGH SENSITIVITY)
Troponin I (High Sensitivity): 22 ng/L — ABNORMAL HIGH (ref <18)
Troponin I (High Sensitivity): 22 ng/L — ABNORMAL HIGH (ref <18)

## 2020-10-12 LAB — PHOSPHORUS: Phosphorus: 2.5 mg/dL (ref 2.5–4.6)

## 2020-10-12 NOTE — Plan of Care (Signed)
  Problem: Education: Goal: Knowledge of disease or condition will improve Outcome: Progressing Goal: Knowledge of secondary prevention will improve Outcome: Progressing Goal: Knowledge of patient specific risk factors addressed and post discharge goals established will improve Outcome: Progressing Goal: Individualized Educational Video(s) Outcome: Progressing   Problem: Coping: Goal: Will verbalize positive feelings about self Outcome: Progressing Goal: Will identify appropriate support needs Outcome: Progressing   Problem: Health Behavior/Discharge Planning: Goal: Ability to manage health-related needs will improve Outcome: Progressing   Problem: Self-Care: Goal: Ability to participate in self-care as condition permits will improve Outcome: Progressing Goal: Verbalization of feelings and concerns over difficulty with self-care will improve Outcome: Progressing Goal: Ability to communicate needs accurately will improve Outcome: Progressing   Problem: Nutrition: Goal: Risk of aspiration will decrease Outcome: Progressing Goal: Dietary intake will improve Outcome: Progressing   Problem: Intracerebral Hemorrhage Tissue Perfusion: Goal: Complications of Intracerebral Hemorrhage will be minimized Outcome: Progressing   

## 2020-10-12 NOTE — Progress Notes (Signed)
Physical Therapy Treatment Patient Details Name: Mark Bass MRN: [id number] DOB: 21-Apr-1961 Today's Date: 10/12/2020    History of Present Illness Mark Bass is a 60 y.o. male admitted on 10/10/20 who stopped taking antihypertensive medications some time ago for unknown reason got up feeling dizzy with a headache, went to the bathroom and collapsed. 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  Pt with PMH of HTN.    PT Comments    Pt making good progress today.  Continues to be limited by L inattention and decreased balance.  Session focused on safety with gait and standing balance.  Continue to progress as able. Pt reports being much better today.     Follow Up Recommendations  CIR     Equipment Recommendations  None recommended by PT    Recommendations for Other Services Rehab consult     Precautions / Restrictions Precautions Precautions: Fall Restrictions Weight Bearing Restrictions: No    Mobility  Bed Mobility Overal bed mobility: Needs Assistance Bed Mobility: Supine to Sit;Sit to Supine     Supine to sit: Supervision Sit to supine: Supervision   General bed mobility comments: for safety    Transfers Overall transfer level: Needs assistance Equipment used: None Transfers: Sit to/from Stand Sit to Stand: Min guard         General transfer comment: min guard for safety  Ambulation/Gait Ambulation/Gait assistance: Min assist Gait Distance (Feet): 150 Feet Assistive device: None Gait Pattern/deviations: Step-through pattern     General Gait Details: Pt with decreased awareness of L side.  Constantly drifting L and needing tactile cues to avoid objects on L   Stairs Stairs: Yes Stairs assistance: Min assist Stair Management: Two rails;Alternating pattern       Wheelchair Mobility    Modified Rankin (Stroke Patients Only) Modified Rankin (Stroke Patients Only) Pre-Morbid Rankin Score: No symptoms Modified Rankin: Moderately severe  disability (Gait with supervision/min guard)     Balance Overall balance assessment: Needs assistance Sitting-balance support: Feet supported;No upper extremity supported Sitting balance-Leahy Scale: Good Sitting balance - Comments: fair dynamic balance when donning socks requiring min guard at times   Standing balance support: No upper extremity supported;Bilateral upper extremity supported;Single extremity supported Standing balance-Leahy Scale: Poor Standing balance comment: Pt was able to ambulate and transfer without UE support - required min guard - min A.  Performed standing balance activities and required assist (see belwo)               High Level Balance Comments: Standing feet apart 30 sec steady; Standing feet together strong L lean, moved to in front of mirror and cued to watch and adjust balance, still with L lean required mod A at times, performed 2 mins total;  Side stepping with assist, forward and backward walking; light pertubations with step to recover; Reaching all directions outside BOS both hands with min A            Cognition Arousal/Alertness: Awake/alert Behavior During Therapy: WFL for tasks assessed/performed Overall Cognitive Status: Impaired/Different from baseline Area of Impairment: Awareness;Problem solving;Safety/judgement                         Safety/Judgement: Decreased awareness of deficits Awareness: Emergent Problem Solving: Requires verbal cues General Comments: pt with decreased awareness of deficits, decreased attention to L side, decreased dual task attention      Exercises      General Comments General comments (skin integrity, edema, etc.):  spouse present and supportive, discussed deficits but unsure how much pt/spouse understood;  spouse reports pt would like to go home      Pertinent Vitals/Pain Pain Assessment: No/denies pain Faces Pain Scale: Hurts a little bit Pain Location: headache Pain Descriptors /  Indicators: Aching Pain Intervention(s): Limited activity within patient's tolerance;Monitored during session;Repositioned    Home Living                      Prior Function            PT Goals (current goals can now be found in the care plan section) Acute Rehab PT Goals Patient Stated Goal: to get back to work PT Goal Formulation: With patient Time For Goal Achievement: 10/25/20 Potential to Achieve Goals: Good Progress towards PT goals: Progressing toward goals    Frequency    Min 4X/week      PT Plan Current plan remains appropriate    Co-evaluation              AM-PAC PT "6 Clicks" Mobility   Outcome Measure  Help needed turning from your back to your side while in a flat bed without using bedrails?: A Little Help needed moving from lying on your back to sitting on the side of a flat bed without using bedrails?: A Little Help needed moving to and from a bed to a chair (including a wheelchair)?: A Little Help needed standing up from a chair using your arms (e.g., wheelchair or bedside chair)?: A Little Help needed to walk in hospital room?: A Little Help needed climbing 3-5 steps with a railing? : A Little 6 Click Score: 18    End of Session Equipment Utilized During Treatment: Gait belt Activity Tolerance: Patient tolerated treatment well Patient left: in bed;with call bell/phone within reach;with bed alarm set Nurse Communication: Mobility status PT Visit Diagnosis: Unsteadiness on feet (R26.81);Other abnormalities of gait and mobility (R26.89);Other symptoms and signs involving the nervous system (R29.898)     Time: 7017-7939 PT Time Calculation (min) (ACUTE ONLY): 24 min  Charges:  $Gait Training: 8-22 mins $Neuromuscular Re-education: 8-22 mins                     Anise Salvo, PT Acute Rehab Services Pager [phone] [facility] [phone]     Rayetta Humphrey 10/12/2020, 4:46 PM

## 2020-10-12 NOTE — Progress Notes (Addendum)
PROGRESS NOTE  Mark Bass PVV:[id number] DOB: [birth year] DOA: 10/10/2020 PCP: Patient, No Pcp Per   LOS: 2 days   Brief Narrative / Interim history: 60 year old male with history of hypertension, noncompliant and stopped taking his antihypertensives sometime ago for unknown reasons presents to the hospital with dizziness, headache and collapse.  The ED a CT scan of the brain showed a 9 cm acute hematoma at the posterior right putamen and adjacent white matter.  It is believed that this happened in the setting of hypertensive emergency due to nonadherence.  Subjective / 24h Interval events: He is doing well this morning, family at bedside.  A little headache but otherwise feels back to normal.  Asking me whether he can go home.  Assessment & Plan: Principal Problem Hypertensive emergency with ICH -initially admitted to the ICU on Cleviprex to control blood pressure.  He underwent a CTA head and neck which revealed advanced intracranial atherosclerosis without etiology for his bleed.  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  A 2D echo was done showed EF 55-60%.  LDL is 10, A1c 6.6.  Currently on amlodipine, losartan, metoprolol  Active Problems Hyperlipidemia-now on statin  Hypokalemia-repleted, potassium within normal limits this morning  Scheduled Meds: . amLODipine  10 mg Oral Daily  . losartan  50 mg Oral BID  . metoprolol tartrate  25 mg Oral BID  . pantoprazole  40 mg Oral QHS  . senna-docusate  1 tablet Oral BID   Continuous Infusions: PRN Meds:.acetaminophen **OR** acetaminophen (TYLENOL) oral liquid 160 mg/5 mL **OR** acetaminophen, fentaNYL (SUBLIMAZE) injection, hydrALAZINE, labetalol  Diet Orders (From admission, onward)    Start     Ordered   10/10/20 1623  Diet Heart Room service appropriate? Yes; Fluid consistency: Thin  Diet effective now       Question Answer Comment  Room service appropriate? Yes   Fluid consistency: Thin      10/10/20 1622           DVT prophylaxis: SCD's Start: 10/10/20 7544     Code Status: Full Code  Family Communication: Wife and son at bedside  Status is: Inpatient  Remains inpatient appropriate because:Inpatient level of care appropriate due to severity of illness   Dispo: The patient is from: Home              Anticipated d/c is to: CIR              Patient currently is not medically stable to d/c.   Difficult to place patient No   Level of care: Telemetry Medical  Consultants:  Neurology   Procedures:  2D echo  Microbiology  none  Antimicrobials: none    Objective: Vitals:   10/11/20 2341 10/12/20 0317 10/12/20 0819 10/12/20 1131  BP: 133/84 (!) 143/85 (!) 155/87 137/82  Pulse: 70 68 70 88  Resp:  18 16 18   Temp: 98.2 F (36.8 C) 98.5 F (36.9 C) (!) 97.5 F (36.4 C) 97.6 F (36.4 C)  TempSrc: Oral Oral  Oral  SpO2: 98% 95% 95% 97%    Intake/Output Summary (Last 24 hours) at 10/12/2020 1219 Last data filed at 10/12/2020 12/12/2020 [place] per 24 hour  Intake 360 ml  Output 200 ml  Net 160 ml   There were no vitals filed for this visit.  Examination:  Constitutional: NAD Eyes: no scleral icterus ENMT: Mucous membranes are moist.  Neck: normal, supple Respiratory: clear to auscultation bilaterally, no wheezing, no crackles.  Cardiovascular: Regular rate and  rhythm, no murmurs / rubs / gallops.  Abdomen: non distended, no tenderness. Bowel sounds positive.  Musculoskeletal: no clubbing / cyanosis.  Skin: no rashes Neurologic: non focal   Data Reviewed: I have independently reviewed following labs and imaging studies   CBC: Recent Labs  Lab 10/10/20 0502 10/10/20 0507 10/11/20 0154 10/12/20 0012  WBC 12.2*  --  9.4 10.9*  NEUTROABS 6.4  --   --   --   HGB 14.2 16.7 14.3 14.2  HCT 44.8 49.0 44.6 45.5  MCV 67.9*  --  66.0* 67.0*  PLT 221  --  220 223   Basic Metabolic Panel: Recent Labs  Lab 10/10/20 0502 10/10/20 0507 10/10/20 0603 10/11/20 0154  10/12/20 0012  NA 133* 137  --  139 136  K 3.1* 3.1*  --  3.2* 3.7  CL 99 99  --  104 104  CO2 22  --   --  25 24  GLUCOSE 168* 170*  --  121* 120*  BUN 17 20  --  13 17  CREATININE 1.00 0.90  --  0.98 1.26*  CALCIUM 8.9  --   --  9.2 9.1  MG  --   --  1.8  --   --   PHOS  --   --  2.0*  --  2.5   Liver Function Tests: Recent Labs  Lab 10/10/20 0502  AST 23  ALT 17  ALKPHOS 67  BILITOT 0.6  PROT 7.9  ALBUMIN 3.8   Coagulation Profile: Recent Labs  Lab 10/10/20 0502  INR 0.9   HbA1C: Recent Labs    10/10/20 0603  HGBA1C 6.6*   CBG: Recent Labs  Lab 10/10/20 0458  GLUCAP 192*    Recent Results (from the past 240 hour(s))  Resp Panel by RT-PCR (Flu A&B, Covid) Nasopharyngeal Swab     Status: None   Collection Time: 10/10/20  5:16 AM   Specimen: Nasopharyngeal Swab; Nasopharyngeal(NP) swabs in vial transport medium  Result Value Ref Range Status   SARS Coronavirus 2 by RT PCR NEGATIVE NEGATIVE Final    Comment: (NOTE) SARS-CoV-2 target nucleic acids are NOT DETECTED.  The SARS-CoV-2 RNA is generally detectable in upper respiratory specimens during the acute phase of infection. The lowest concentration of SARS-CoV-2 viral copies this assay can detect is 138 copies/mL. A negative result does not preclude SARS-Cov-2 infection and should not be used as the sole basis for treatment or other patient management decisions. A negative result may occur with  improper specimen collection/handling, submission of specimen other than nasopharyngeal swab, presence of viral mutation(s) within the areas targeted by this assay, and inadequate number of viral copies(<138 copies/mL). A negative result must be combined with clinical observations, patient history, and epidemiological information. The expected result is Negative.  Fact Sheet for Patients:  BloggerCourse.com  Fact Sheet for Healthcare Providers:   SeriousBroker.it  This test is no t yet approved or cleared by the [place] FDA and  has been authorized for detection and/or diagnosis of SARS-CoV-2 by FDA under an Emergency Use Authorization (EUA). This EUA will remain  in effect (meaning this test can be used) for the duration of the COVID-19 declaration under Section 564(b)(1) of the Act, 21 U.S.C.section 360bbb-3(b)(1), unless the authorization is terminated  or revoked sooner.       Influenza A by PCR NEGATIVE NEGATIVE Final   Influenza B by PCR NEGATIVE NEGATIVE Final    Comment: (NOTE) The Xpert Xpress SARS-CoV-2/FLU/RSV plus assay  is intended as an aid in the diagnosis of influenza from Nasopharyngeal swab specimens and should not be used as a sole basis for treatment. Nasal washings and aspirates are unacceptable for Xpert Xpress SARS-CoV-2/FLU/RSV testing.  Fact Sheet for Patients: BloggerCourse.com  Fact Sheet for Healthcare Providers: SeriousBroker.it  This test is not yet approved or cleared by the [place] FDA and has been authorized for detection and/or diagnosis of SARS-CoV-2 by FDA under an Emergency Use Authorization (EUA). This EUA will remain in effect (meaning this test can be used) for the duration of the COVID-19 declaration under Section 564(b)(1) of the Act, 21 U.S.C. section 360bbb-3(b)(1), unless the authorization is terminated or revoked.  Performed at [facility], [address], [place], [place] [address]   MRSA PCR Screening     Status: None   Collection Time: 10/10/20  4:31 PM   Specimen: Nasopharyngeal  Result Value Ref Range Status   MRSA by PCR NEGATIVE NEGATIVE Final    Comment:        The GeneXpert MRSA Assay (FDA approved for NASAL specimens only), is one component of a comprehensive MRSA colonization surveillance program. It is not intended to diagnose MRSA infection nor to guide  or monitor treatment for MRSA infections. Performed at [facility], [address] [place], [place] [address]      Radiology Studies: No results found.   Pamella Pert, MD, PhD [facility]  Between 7 am - 7 pm I am available, please contact me via Amion or Securechat  Between 7 pm - 7 am I am not available, please contact night coverage MD/APP via Amion

## 2020-10-12 NOTE — Progress Notes (Addendum)
STROKE TEAM PROGRESS NOTE   INTERVAL HISTORY  Patient sitting up on the side of the bed eating breakfast. He complains of mild headache this am. Afebrile, vital signs stable. Neuro stable, no acute event overnight. Still has mild left facial droop and left hand dexterity difficulty. PT OT recommend CIR.  Vitals:   10/11/20 2000 10/11/20 2341 10/12/20 0317 10/12/20 0819  BP: (!) 147/97 133/84 (!) 143/85 (!) 155/87  Pulse: 76 70 68 70  Resp: 17  18 16   Temp: 97.6 F (36.4 C) 98.2 F (36.8 C) 98.5 F (36.9 C) (!) 97.5 F (36.4 C)  TempSrc: Oral Oral Oral   SpO2: 97% 98% 95% 95%   CBC:  Recent Labs  Lab 10/10/20 0502 10/10/20 0507 10/11/20 0154 10/12/20 0012  WBC 12.2*  --  9.4 10.9*  NEUTROABS 6.4  --   --   --   HGB 14.2   < > 14.3 14.2  HCT 44.8   < > 44.6 45.5  MCV 67.9*  --  66.0* 67.0*  PLT 221  --  220 223   < > = values in this interval not displayed.   Basic Metabolic Panel:  Recent Labs  Lab 10/10/20 0603 10/11/20 0154 10/12/20 0012  NA  --  139 136  K  --  3.2* 3.7  CL  --  104 104  CO2  --  25 24  GLUCOSE  --  121* 120*  BUN  --  13 17  CREATININE  --  0.98 1.26*  CALCIUM  --  9.2 9.1  MG 1.8  --   --   PHOS 2.0*  --  2.5   Lipid Panel:  Recent Labs  Lab 10/10/20 0603  CHOL 194  TRIG 165*  HDL 51  CHOLHDL 3.8  VLDL 33  LDLCALC 12/10/20*   HgbA1c:  Recent Labs  Lab 10/10/20 0603  HGBA1C 6.6*   Urine Drug Screen:  Recent Labs  Lab 10/10/20 0827  LABOPIA NONE DETECTED  COCAINSCRNUR NONE DETECTED  LABBENZ NONE DETECTED  AMPHETMU NONE DETECTED  THCU NONE DETECTED  LABBARB NONE DETECTED    Alcohol Level  Recent Labs  Lab 10/10/20 0502  ETH <10    IMAGING past 24 hours No results found.  PHYSICAL EXAM Appears well-developed and well-nourished. No acute distress Psych: Affect appropriate to situation Eyes: + scleral injection HENT: No OP obstrucion Head: Normocephalic.  Cardiovascular: Tachycardic with regular rhythm.   Respiratory: Effort normal and breath sounds normal to anterior ascultation GI: Soft.  No distension. There is no tenderness.  Skin: WDI  Neuro: Mental Status: He is awake, alert, oriented to person, place, year, and situation. His speech is fluent without aphasia. He does have some slight neglect. CN: Visual Fields are full. Pupils are equal, round, and reactive to light. EOMI without ptosis or diploplia. Facial sensation is symmetric to temperature Face left lower weakness. Hearing is intact to voice. Uvula midline, palate elevates symmetrically. Shoulder shrug is symmetric.Tongue is midline  Motor: Tone is normal. Bulk is normal. 5/5 strength was present in all four extremities except LUE 4+. Sensation: is symmetric to light touch and temperature in the arms and legs. Deep Tendon Reflexes: 2+ and symmetric in the biceps and patellae. Toes are downgoing bilaterally. Coordination: FNF and HKS are intact bilaterally. Gait: Deferred  ASSESSMENT/PLAN [name] is a 60 y.o. male with history of HTN and medication non-compliance. On 3/5 pt awoke very early at ~0400 to go to the bathroom  and became dizzy with headache. He had difficulty standing and left facial droop. CTH showed Right BG ICH. ICH score 1.  ICH: Right BG, likely due to HTN etiology.   CT head - right BG ICH, ~ 10cc  CTA head & neck: severe athro disease, but no underlying AVM or etiology for bleed. No Spot sign.   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 No underlying mass.  EF 55-60%  LDL 110  HgbA1c 6.6  UDS negative  VTE prophylaxis - SCDs  No antithrombotics PTA, now not on antithrombotics due to ICH. Hold antithrombotics given acute bleed. Decision on resumption on outpatient f/u after follow up outapatient  Therapy recommendation - CIR  Neurology will sign off.  Please call with questions.  Patient will followup with stroke clinic at [facility] in about 4  weeks.    Hypertensive emergency  Home meds:  None  Stable on the high side . On amlodipine 10 mg daily, losartan 50 mg bid, metoprolol 25 mg bid  . Long-term BP goal normotensive  Hyperlipidemia  Home meds: none  LDL 110, goal < 70  No statin for now due to ICH  Consider statin on discharge  Other Stroke Risk Factors  Asian decent  Previous cigarette smoker  Other acute issues  Leukocytosis, WBC 12.2->9.4  Hypokalemia - K 3.0->3.2, supplement  Elevated creatinine 0.98 ->1.26, good uop, continue to monitor  Hospital day # 2  [name], ACNP-BC Stroke Neurology 3/7//2022  Attending Neurohospitalist Addendum Patient seen and examined with APP/Resident. Agree with the history and physical as documented above. Agree with the plan as documented, which I helped formulate. I have independently reviewed the chart, obtained history, review of systems and examined the patient.I have personally reviewed pertinent head/neck/spine imaging (CT/MRI). Please feel free to call with any questions.   -- Milon Dikes, MD Stroke Neurology Pager: [phone]        To contact Stroke Continuity provider, please refer to [email or web address]. After hours, contact General Neurology

## 2020-10-12 NOTE — Progress Notes (Signed)
Occupational Therapy Treatment Patient Details Name: Mark Bass MRN: [id number] DOB: 1961-02-13 Today's Date: 10/12/2020    History of present illness Mark Bass is a 60 y.o. male right handed MPHx as reviwed below, HTN who stopped taking antihypertensive medications some time ago for unknown reason got up feeling dizzy with a headache, went to the bathroom and collapsed. CT/MRI:  hemorrhagic stroke in the right basal ganglia   OT comments  Patient in bed upon entry, spouse at side.  Patient asleep but easily awoken by spouse.  Completing bed mobility with min guard for safety, LB dressing at EOB with min assist, toileting with min assist and grooming with min assist. Transfers with min assist and in room mobility with min assist.  He requires cueing to attend to L side of body and environment, cueing to locate items at sink on L side.  Decreased dual task attention, as noted increased L lateral lean with grooming tasks at sink.  Patients spouse assisted with translation at times, utilized gestures as well (interpretation not available). Continue to highly recommend CIR at dc to optimize attention, L inattention, balance and L sided coordination. Will follow acutely.    Follow Up Recommendations  CIR;Supervision - Intermittent    Equipment Recommendations  Tub/shower seat    Recommendations for Other Services      Precautions / Restrictions Precautions Precautions: Fall Restrictions Weight Bearing Restrictions: No       Mobility Bed Mobility Overal bed mobility: Needs Assistance Bed Mobility: Supine to Sit     Supine to sit: Min guard     General bed mobility comments: for safety    Transfers Overall transfer level: Needs assistance Equipment used: 1 person hand held assist Transfers: Sit to/from Stand Sit to Stand: Min assist         General transfer comment: min assist to steady during transitions    Balance Overall balance assessment: Needs  assistance Sitting-balance support: Feet supported Sitting balance-Leahy Scale: Fair Sitting balance - Comments: fair dynamic balance when donning socks requiring min guard at times   Standing balance support: During functional activity Standing balance-Leahy Scale: Poor Standing balance comment: instability noted, L lateral lean without UE support at sink                           ADL either performed or assessed with clinical judgement   ADL Overall ADL's : Needs assistance/impaired     Grooming: Minimal assistance;Wash/dry hands;Wash/dry face;Oral care;Standing Grooming Details (indicate cue type and reason): for standing balance with L lateral lean and verbal cueing to locate items on L side             Lower Body Dressing: Minimal assistance;Sit to/from stand Lower Body Dressing Details (indicate cue type and reason): able to don/doff socks at EOB with min assist, min guard for balance seated; sit to stand with min assist Toilet Transfer: Minimal assistance;Ambulation Toilet Transfer Details (indicate cue type and reason): hand held assist out of bathroom Toileting- Clothing Manipulation and Hygiene: Minimal assistance;Sit to/from stand Toileting - Clothing Manipulation Details (indicate cue type and reason): balance       General ADL Comments: spouse present and assisting with translation, utilized gestures as needed     Vision       Perception     Praxis      Cognition Arousal/Alertness: Awake/alert Behavior During Therapy: WFL for tasks assessed/performed Overall Cognitive Status: Impaired/Different from baseline Area of Impairment: Awareness;Problem  solving;Safety/judgement                         Safety/Judgement: Decreased awareness of deficits Awareness: Emergent Problem Solving: Requires verbal cues General Comments: pt with decreased awareness of deficits, decreased attention to L side, decreased dual task attention         Exercises     Shoulder Instructions       General Comments spouse present and supportive, discussed deficits but unsure how much pt/spouse understood;  spouse reports pt would like to go home    Pertinent Vitals/ Pain       Pain Assessment: Faces Faces Pain Scale: Hurts a little bit Pain Location: headache Pain Descriptors / Indicators: Aching Pain Intervention(s): Limited activity within patient's tolerance;Monitored during session;Repositioned  Home Living                                          Prior Functioning/Environment              Frequency  Min 2X/week        Progress Toward Goals  OT Goals(current goals can now be found in the care plan section)  Progress towards OT goals: Progressing toward goals  Acute Rehab OT Goals Patient Stated Goal: to get back to work OT Goal Formulation: With patient  Plan Discharge plan remains appropriate;Frequency remains appropriate    Co-evaluation                 AM-PAC OT "6 Clicks" Daily Activity     Outcome Measure   Help from another person eating meals?: A Little Help from another person taking care of personal grooming?: A Little Help from another person toileting, which includes using toliet, bedpan, or urinal?: A Little Help from another person bathing (including washing, rinsing, drying)?: A Little Help from another person to put on and taking off regular upper body clothing?: A Little Help from another person to put on and taking off regular lower body clothing?: A Little 6 Click Score: 18    End of Session Equipment Utilized During Treatment: Gait belt  OT Visit Diagnosis: Unsteadiness on feet (R26.81);Other abnormalities of gait and mobility (R26.89);Other symptoms and signs involving cognitive function;Low vision, both eyes (H54.2)   Activity Tolerance Patient tolerated treatment well   Patient Left in bed;with call bell/phone within reach;with bed alarm set;with  family/visitor present   Nurse Communication Mobility status        Time: 1356-1416 OT Time Calculation (min): 20 min  Charges: OT General Charges $OT Visit: 1 Visit OT Treatments $Self Care/Home Management : 8-22 mins  Barry Brunner, OT Acute Rehabilitation Services Pager [phone] Office [phone]    Chancy Milroy 10/12/2020, 3:11 PM

## 2020-10-12 NOTE — Evaluation (Signed)
Speech Language Pathology Evaluation Patient Details Name: Mark Bass MRN: [id number] DOB: May 08, 1961 Today's Date: 10/12/2020 Time: 1030-1050 SLP Time Calculation (min) (ACUTE ONLY): 20 min  Problem List:  Patient Active Problem List   Diagnosis Date Noted  . Stroke, 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HCC) 10/10/2020  . COVID-19 virus infection 01/02/2019  . Pneumonia due to COVID-19 virus 01/01/2019  . Elevated blood pressure reading without diagnosis of hypertension 01/01/2019  . Dehydration 01/01/2019  . Sepsis (HCC) 01/01/2019   Past Medical History:  Past Medical History:  Diagnosis Date  . Hypertension    Past Surgical History:  Past Surgical History:  Procedure Laterality Date  . NECK SURGERY     HPI:  60yo male admitted 10/10/20 with stroke symptoms including headache, dizziness, collapse. PMH: HTN. No interpreter service available for pt dialect. hemorrhagic stroke right basal ganglia.   Assessment / Plan / Recommendation Clinical Impression  The Mini-Mental State Examination (MMSE) was administered. Pt's wife and son were present and assisted with interpreting, although pt was able to understand and respond in English very well. Pt scored 28/30 on MMSE, indicating performance within functional limits. According to family, pt has limited education, but was independent with management of household finances/medications and working full time as a [employer] prior to admit. Points were lost on attention/calculation (serial 7's). Pt and his family indicate pt appears to be at his baseline level of function re: cognitive linguistics. Wife reports he speaks more slowly, but he was fairly intelligible given [place] accent. PT/OT are recommending CIR stay to maximize function. Recommend more formal evaluation with speech therapy if needs arise. Acute ST signing off at this time.    SLP Assessment  SLP Recommendation/Assessment: All further Speech Language Pathology needs can be addressed in the next  venue of care (if needed.)  SLP Visit Diagnosis: Cognitive communication deficit (R41.841)       SLP Evaluation Cognition  Overall Cognitive Status: Within Functional Limits for tasks assessed Arousal/Alertness: Awake/alert Orientation Level: Oriented X4 Memory: Appears intact Immediate Memory Recall: Sock;Blue;Bed Memory Recall Sock: Without Cue Memory Recall Blue: Without Cue Memory Recall Bed: Without Cue       Comprehension  Auditory Comprehension Overall Auditory Comprehension: Appears within functional limits for tasks assessed Reading Comprehension Reading Status: Within funtional limits    Expression Expression Primary Mode of Expression: Verbal Verbal Expression Overall Verbal Expression: Appears within functional limits for tasks assessed Written Expression Dominant Hand: Right   Oral / Motor  Oral Motor/Sensory Function Overall Oral Motor/Sensory Function: Within functional limits Motor Speech Overall Motor Speech: Appears within functional limits for tasks assessed   GO                   Dava Rensch B. Murvin Natal, [facility], CCC-SLP Speech Language Pathologist Office: [phone]  Leigh Aurora 10/12/2020, 11:12 AM

## 2020-10-13 LAB — CBC
HCT: 44.5 % (ref 39.0–52.0)
Hemoglobin: 14.3 g/dL (ref 13.0–17.0)
MCH: 21.4 pg — ABNORMAL LOW (ref 26.0–34.0)
MCHC: 32.1 g/dL (ref 30.0–36.0)
MCV: 66.5 fL — ABNORMAL LOW (ref 80.0–100.0)
Platelets: 223 10*3/uL (ref 150–400)
RBC: 6.69 MIL/uL — ABNORMAL HIGH (ref 4.22–5.81)
RDW: 17.2 % — ABNORMAL HIGH (ref 11.5–15.5)
WBC: 10.3 10*3/uL (ref 4.0–10.5)
nRBC: 0 % (ref 0.0–0.2)

## 2020-10-13 LAB — BASIC METABOLIC PANEL
Anion gap: 8 (ref 5–15)
BUN: 19 mg/dL (ref 8–23)
CO2: 26 mmol/L (ref 22–32)
Calcium: 9.3 mg/dL (ref 8.9–10.3)
Chloride: 102 mmol/L (ref 98–111)
Creatinine, Ser: 1.1 mg/dL (ref 0.61–1.24)
GFR, Estimated: >60 mL/min (ref >60)
Glucose, Bld: 120 mg/dL — ABNORMAL HIGH (ref 70–99)
Potassium: 3.8 mmol/L (ref 3.5–5.1)
Sodium: 136 mmol/L (ref 135–145)

## 2020-10-13 MED ORDER — METOPROLOL TARTRATE 25 MG PO TABS: 25.0000 mg | ORAL_TABLET | Freq: Two times a day (BID) | ORAL | 1 refills | Status: DC

## 2020-10-13 MED ORDER — ATORVASTATIN CALCIUM 40 MG PO TABS: 40.0000 mg | ORAL_TABLET | Freq: Every day | ORAL | 1 refills | Status: DC

## 2020-10-13 MED ORDER — POLYETHYLENE GLYCOL 3350 17 G PO PACK
17.0000 g | PACK | Freq: Every day | ORAL | Status: DC
  Administered 2020-10-13: 17 g via ORAL
  Filled 2020-10-13: qty 1

## 2020-10-13 MED ORDER — AMLODIPINE BESYLATE 10 MG PO TABS: 10.0000 mg | ORAL_TABLET | Freq: Every day | ORAL | 1 refills | Status: DC

## 2020-10-13 MED ORDER — LOSARTAN POTASSIUM 50 MG PO TABS: 50.0000 mg | ORAL_TABLET | Freq: Two times a day (BID) | ORAL | 1 refills | Status: DC

## 2020-10-13 NOTE — Discharge Instructions (Signed)
Follow with [facility] in 2 weeks  Please get a complete blood count and chemistry panel checked by your Primary MD at your next visit, and again as instructed by your Primary MD. Please get your medications reviewed and adjusted by your Primary MD.  Please request your Primary MD to go over all Hospital Tests and Procedure/Radiological results at the follow up, please get all Hospital records sent to your Prim MD by signing hospital release before you go home.  In some cases, there will be blood work, cultures and biopsy results pending at the time of your discharge. Please request that your primary care M.D. goes through all the records of your hospital data and follows up on these results.  If you had Pneumonia of Lung problems at the Hospital: Please get a 2 view Chest X ray done in 6-8 weeks after hospital discharge or sooner if instructed by your Primary MD.  If you have Congestive Heart Failure: Please call your Cardiologist or Primary MD anytime you have any of the following symptoms:  1) 3 pound weight gain in 24 hours or 5 pounds in 1 week  2) shortness of breath, with or without a dry hacking cough  3) swelling in the hands, feet or stomach  4) if you have to sleep on extra pillows at night in order to breathe  Follow cardiac low salt diet and 1.5 lit/day fluid restriction.  If you have diabetes Accuchecks 4 times/day, Once in AM empty stomach and then before each meal. Log in all results and show them to your primary doctor at your next visit. If any glucose reading is under 80 or above 300 call your primary MD immediately.  If you have Seizure/Convulsions/Epilepsy: Please do not drive, operate heavy machinery, participate in activities at heights or participate in high speed sports until you have seen by Primary MD or a Neurologist and advised to do so again. Per [facility] statutes, patients with seizures are not allowed to drive until they have been  seizure-free for six months.  Use caution when using heavy equipment or power tools. Avoid working on ladders or at heights. Take showers instead of baths. Ensure the water temperature is not too high on the home water heater. Do not go swimming alone. Do not lock yourself in a room alone (i.e. bathroom). When caring for infants or small children, sit down when holding, feeding, or changing them to minimize risk of injury to the child in the event you have a seizure. Maintain good sleep hygiene. Avoid alcohol.   If you had Gastrointestinal Bleeding: Please ask your Primary MD to check a complete blood count within one week of discharge or at your next visit. Your endoscopic/colonoscopic biopsies that are pending at the time of discharge, will also need to followed by your Primary MD.  Get Medicines reviewed and adjusted. Please take all your medications with you for your next visit with your Primary MD  Please request your Primary MD to go over all hospital tests and procedure/radiological results at the follow up, please ask your Primary MD to get all Hospital records sent to his/her office.  If you experience worsening of your admission symptoms, develop shortness of breath, life threatening emergency, suicidal or homicidal thoughts you must seek medical attention immediately by calling 911 or calling your MD immediately  if symptoms less severe.  You must read complete instructions/literature along with all the possible adverse reactions/side effects for all the Medicines you take  and that have been prescribed to you. Take any new Medicines after you have completely understood and accpet all the possible adverse reactions/side effects.   Do not drive or operate heavy machinery when taking Pain medications.   Do not take more than prescribed Pain, Sleep and Anxiety Medications  Special Instructions: If you have smoked or chewed Tobacco  in the last 2 yrs please stop smoking, stop any regular  Alcohol  and or any Recreational drug use.  Wear Seat belts while driving.  Please note You were cared for by a hospitalist during your hospital stay. If you have any questions about your discharge medications or the care you received while you were in the hospital after you are discharged, you can call the unit and asked to speak with the hospitalist on call if the hospitalist that took care of you is not available. Once you are discharged, your primary care physician will handle any further medical issues. Please note that NO REFILLS for any discharge medications will be authorized once you are discharged, as it is imperative that you return to your primary care physician (or establish a relationship with a primary care physician if you do not have one) for your aftercare needs so that they can reassess your need for medications and monitor your lab values.  You can reach the hospitalist office at phone [phone] or fax [phone]   If you do not have a primary care physician, you can call [phone] for a physician referral.  Activity: As tolerated with Full fall precautions use walker/cane & assistance as needed    Diet: regular  Disposition Home

## 2020-10-13 NOTE — Progress Notes (Signed)
Physical Therapy Treatment Patient Details Name: Mark Bass MRN: [id number] DOB: 02/17/1961 Today's Date: 10/13/2020    History of Present Illness Mark Bass is a 60 y/o male admitted on 10/10/20 who stopped taking antihypertensive medications some time ago for unknown reason got up feeling dizzy with a headache, went to the bathroom and collapsed. 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 Pt with PMH of HTN.    PT Comments    Pt progressing towards physical therapy goals. He was able to demonstrate transfers and ambulation with gross min guard assist to min assist with RW for support. Pt will require a youth size RW at d/c. Overall pt is steady with OOB mobility however awareness of deficits and general safety awareness appears decreased. Deficits worsen with fatigue. Acute PT continues to feel that CIR would be the most beneficial d/c disposition at this time, however noted that pt and family wish for return home with HHPT to follow up. Feel this is reasonable with 24 hour support and necessary DME. Will continue to follow and progress as able per POC.    Follow Up Recommendations  CIR     Equipment Recommendations  3in1 (PT), Youth RW    Recommendations for Other Services Rehab consult     Precautions / Restrictions Precautions Precautions: Fall Restrictions Weight Bearing Restrictions: No    Mobility  Bed Mobility Overal bed mobility: Needs Assistance Bed Mobility: Supine to Sit     Supine to sit: Supervision Sit to supine: Supervision   General bed mobility comments: for safety. HOB flat and rails lowered to simulate home environment.    Transfers Overall transfer level: Needs assistance Equipment used: None Transfers: Sit to/from Stand Sit to Stand: Min guard         General transfer comment: min guard for safety  Ambulation/Gait Ambulation/Gait assistance: Min assist Gait Distance (Feet): 200 Feet Assistive device: None Gait Pattern/deviations:  Step-through pattern Gait velocity: decreased and guarded Gait velocity interpretation: <1.8 ft/sec, indicate of risk for recurrent falls General Gait Details: Pt with decreased awareness of L side.  Occasionally drifting L initially, however drifting L more frequently as pt fatigued.   Stairs             Wheelchair Mobility    Modified Rankin (Stroke Patients Only) Modified Rankin (Stroke Patients Only) Pre-Morbid Rankin Score: No symptoms Modified Rankin: Moderately severe disability (Gait with supervision/min guard)     Balance Overall balance assessment: Needs assistance Sitting-balance support: Feet supported;No upper extremity supported Sitting balance-Leahy Scale: Good Sitting balance - Comments: fair dynamic balance when donning socks requiring min guard at times   Standing balance support: No upper extremity supported;Bilateral upper extremity supported;Single extremity supported Standing balance-Leahy Scale: Poor Standing balance comment: Reliant on UE for support                            Cognition Arousal/Alertness: Awake/alert Behavior During Therapy: WFL for tasks assessed/performed Overall Cognitive Status: Impaired/Different from baseline Area of Impairment: Awareness;Problem solving;Safety/judgement                         Safety/Judgement: Decreased awareness of deficits;Decreased awareness of safety Awareness: Emergent Problem Solving: Requires verbal cues General Comments: pt with decreased awareness of deficits, decreased attention to L side, decreased dual task attention      Exercises      General Comments        Pertinent  Vitals/Pain Pain Assessment: No/denies pain Pain Intervention(s): Monitored during session    Home Living                      Prior Function            PT Goals (current goals can now be found in the care plan section) Acute Rehab PT Goals Patient Stated Goal: to get back to  work PT Goal Formulation: With patient Time For Goal Achievement: 10/25/20 Potential to Achieve Goals: Good Progress towards PT goals: Progressing toward goals    Frequency    Min 4X/week      PT Plan Current plan remains appropriate    Co-evaluation              AM-PAC PT "6 Clicks" Mobility   Outcome Measure  Help needed turning from your back to your side while in a flat bed without using bedrails?: A Little Help needed moving from lying on your back to sitting on the side of a flat bed without using bedrails?: A Little Help needed moving to and from a bed to a chair (including a wheelchair)?: A Little Help needed standing up from a chair using your arms (e.g., wheelchair or bedside chair)?: A Little Help needed to walk in hospital room?: A Little Help needed climbing 3-5 steps with a railing? : A Little 6 Click Score: 18    End of Session Equipment Utilized During Treatment: Gait belt Activity Tolerance: Patient tolerated treatment well Patient left: in bed;with call bell/phone within reach;with bed alarm set Nurse Communication: Mobility status PT Visit Diagnosis: Unsteadiness on feet (R26.81);Other abnormalities of gait and mobility (R26.89);Other symptoms and signs involving the nervous system (R29.898)     Time: 4081-4481 PT Time Calculation (min) (ACUTE ONLY): 30 min  Charges:  $Gait Training: 23-37 mins                     Conni Slipper, PT, DPT Acute Rehabilitation Services Pager: [phone] Office: [phone]    Marylynn Pearson 10/13/2020, 3:13 PM

## 2020-10-13 NOTE — Plan of Care (Signed)
  Problem: Education: Goal: Knowledge of disease or condition will improve Outcome: Progressing Goal: Knowledge of secondary prevention will improve Outcome: Progressing Goal: Knowledge of patient specific risk factors addressed and post discharge goals established will improve Outcome: Progressing Goal: Individualized Educational Video(s) Outcome: Progressing   Problem: Coping: Goal: Will verbalize positive feelings about self Outcome: Progressing Goal: Will identify appropriate support needs Outcome: Progressing   Problem: Health Behavior/Discharge Planning: Goal: Ability to manage health-related needs will improve Outcome: Progressing   Problem: Self-Care: Goal: Ability to participate in self-care as condition permits will improve Outcome: Progressing Goal: Verbalization of feelings and concerns over difficulty with self-care will improve Outcome: Progressing Goal: Ability to communicate needs accurately will improve Outcome: Progressing   Problem: Nutrition: Goal: Risk of aspiration will decrease Outcome: Progressing Goal: Dietary intake will improve Outcome: Progressing   Problem: Intracerebral Hemorrhage Tissue Perfusion: Goal: Complications of Intracerebral Hemorrhage will be minimized Outcome: Progressing   

## 2020-10-13 NOTE — Discharge Summary (Signed)
Physician Discharge Summary  Mark Bass WUJ:[id number] DOB: 05-23-1961 DOA: 10/10/2020  PCP: Patient, No Pcp Per  Admit date: 10/10/2020 Discharge date: 10/13/2020  Admitted From: home Disposition:  home  Recommendations for Outpatient Follow-up:  1. Follow up with PCP in 1-2 weeks  Home Health: PT, OT, RN Equipment/Devices: none  Discharge Condition: stable CODE STATUS: Full code Diet recommendation: regular  HPI: Per admitting MD, Mark Bass is a 60 y.o. male right handed MPHx as reviwed below, HTN who stopped taking antihypertensive medications some time ago for unknown reason got up feeling dizzy with a headache, went to the bathroom and collapsed. His wife went to see what was taking so long and found him down. She then called their son ~[phone] who activated EMS. This has never happened before. Denies recent illness, N/V/F/C, cp, sob.  Hospital Course / Discharge diagnoses: Principal Problem Hypertensive emergency with ICH -initially admitted to the ICU on Cleviprex to control blood pressure. CT scan of the brain showed a 9 cm acute hematoma at the posterior right putamen and adjacent white matter.  It is believed that this happened in the setting of hypertensive emergency due to nonadherence. He underwent a CTA head and neck which revealed advanced intracranial atherosclerosis without etiology for his bleed.  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  A 2D echo was done showed EF 55-60%.  LDL is 10, A1c 6.6. He was placed on amlodipine, losartan, metoprolol with good BP control and these were prescribed on discharge. He was referred to [facility] for follow up  Active Problems Hyperlipidemia-now on statin Hypokalemia-repleted  Sepsis ruled out   Discharge Instructions  Discharge Instructions    Ambulatory referral to Neurology   Complete by: As directed    An appointment is requested in approximately: 8 weeks     Allergies as of 10/13/2020   No Known  Allergies     Medication List    TAKE these medications   amLODipine 10 MG tablet Commonly known as: NORVASC Take 1 tablet (10 mg total) by mouth daily.   atorvastatin 40 MG tablet Commonly known as: Lipitor Take 1 tablet (40 mg total) by mouth daily.   losartan 50 MG tablet Commonly known as: COZAAR Take 1 tablet (50 mg total) by mouth 2 (two) times daily.   metoprolol tartrate 25 MG tablet Commonly known as: LOPRESSOR Take 1 tablet (25 mg total) by mouth 2 (two) times daily.       Follow-up Information    [facility]. Schedule an appointment as soon as possible for a visit in 2 week(s).   Contact information: [address] [address] [phone]              Consultations:  Neurology   Procedures/Studies:  CT ANGIO HEAD W OR WO CONTRAST  Result Date: 10/10/2020 CLINICAL DATA:  Follow-up parenchymal hemorrhage EXAM: CT ANGIOGRAPHY HEAD AND NECK TECHNIQUE: Multidetector CT imaging of the head and neck was performed using the standard protocol during bolus administration of intravenous contrast. Multiplanar CT image reconstructions and MIPs were obtained to evaluate the vascular anatomy. Carotid stenosis measurements (when applicable) are obtained utilizing NASCET criteria, using the distal internal carotid diameter as the denominator. CONTRAST:  75mL OMNIPAQUE IOHEXOL 350 MG/ML SOLN COMPARISON:  None. FINDINGS: CTA NECK FINDINGS Aortic arch: Normal arch.  Three vessel branching. Right carotid system: Low-density atheromatous type wall thickening of the common carotid. No stenosis, ulceration, or beading. Left carotid  system: Low-density atheromatous type wall thickening of the common carotid. No stenosis, ulceration, or beading. Vertebral arteries: No proximal subclavian stenosis. Codominant vertebral arteries which are smooth and widely patent to the dura. Skeleton: No acute or aggressive finding. Other neck:  Nodular and bandlike scarring in the suboccipital neck. Upper chest: Negative Review of the MIP images confirms the above findings CTA HEAD FINDINGS Anterior circulation: No proximal occlusion, aneurysm, or vascular malformation. Widespread atheromatous changes to ACA and MCA distributions. The worst stenosis is at the right A2/A3 level. Right MCA stenosis is notable for moderate M1 and M2 narrowings. Posterior circulation: No proximal occlusion, aneurysm, or vascular malformation. Moderate atheromatous type narrowing at the right vertebrobasilar junction and at the mid basilar. Left more than right PCA atheromatous irregularity and stenosis. Venous sinuses: Not opacified in the arterial phase Anatomic variants: None significant. Other: No spot sign seen at the parenchymal hemorrhage on the right which has a stable shape. Review of the MIP images confirms the above findings IMPRESSION: 1. No spot sign of or visible vascular lesion underlying the parenchymal hemorrhage. 2. Advanced intracranial atherosclerosis. Electronically Signed   By: Marnee SpringJonathon  Watts M.D.   On: 10/10/2020 09:17   DG Chest 1 View  Result Date: 10/10/2020 CLINICAL DATA:  Hemorrhagic stroke EXAM: CHEST  1 VIEW COMPARISON:  01/07/2019 FINDINGS: Cardiomegaly. Stable mediastinal contours. There is no edema, consolidation, effusion, or pneumothorax. Artifact from EKG leads. IMPRESSION: No acute finding. Chronic cardiomegaly. Electronically Signed   By: Marnee SpringJonathon  Watts M.D.   On: 10/10/2020 06:26   CT ANGIO NECK W OR WO CONTRAST  Result Date: 10/10/2020 CLINICAL DATA:  Follow-up parenchymal hemorrhage EXAM: CT ANGIOGRAPHY HEAD AND NECK TECHNIQUE: Multidetector CT imaging of the head and neck was performed using the standard protocol during bolus administration of intravenous contrast. Multiplanar CT image reconstructions and MIPs were obtained to evaluate the vascular anatomy. Carotid stenosis measurements (when applicable) are obtained utilizing  NASCET criteria, using the distal internal carotid diameter as the denominator. CONTRAST:  75mL OMNIPAQUE IOHEXOL 350 MG/ML SOLN COMPARISON:  None. FINDINGS: CTA NECK FINDINGS Aortic arch: Normal arch.  Three vessel branching. Right carotid system: Low-density atheromatous type wall thickening of the common carotid. No stenosis, ulceration, or beading. Left carotid system: Low-density atheromatous type wall thickening of the common carotid. No stenosis, ulceration, or beading. Vertebral arteries: No proximal subclavian stenosis. Codominant vertebral arteries which are smooth and widely patent to the dura. Skeleton: No acute or aggressive finding. Other neck: Nodular and bandlike scarring in the suboccipital neck. Upper chest: Negative Review of the MIP images confirms the above findings CTA HEAD FINDINGS Anterior circulation: No proximal occlusion, aneurysm, or vascular malformation. Widespread atheromatous changes to ACA and MCA distributions. The worst stenosis is at the right A2/A3 level. Right MCA stenosis is notable for moderate M1 and M2 narrowings. Posterior circulation: No proximal occlusion, aneurysm, or vascular malformation. Moderate atheromatous type narrowing at the right vertebrobasilar junction and at the mid basilar. Left more than right PCA atheromatous irregularity and stenosis. Venous sinuses: Not opacified in the arterial phase Anatomic variants: None significant. Other: No spot sign seen at the parenchymal hemorrhage on the right which has a stable shape. Review of the MIP images confirms the above findings IMPRESSION: 1. No spot sign of or visible vascular lesion underlying the parenchymal hemorrhage. 2. Advanced intracranial atherosclerosis. Electronically Signed   By: Marnee SpringJonathon  Watts M.D.   On: 10/10/2020 09:17   MR BRAIN WO CONTRAST  Result Date: 10/11/2020 CLINICAL DATA:  Uncontrolled hypertension with right basal ganglia hemorrhage. EXAM: MRI HEAD WITHOUT CONTRAST TECHNIQUE:  Multiplanar, multiecho pulse sequences of the brain and surrounding structures were obtained without intravenous contrast. COMPARISON:  CT and CTA from yesterday FINDINGS: Brain: Known hematoma centered at the right basal ganglia with rim of edema. Dimensions are approximately 3.2 x 1.8 cm on axial T2 weighted imaging, similar to prior. No visible intraventricular extension. There is a background of chronic small vessel ischemia with FLAIR hyperintensity and remote micro hemorrhages in the brainstem and deep gray matter. No hydrocephalus, collection, or evidence of parenchymal mass. Vascular: Major flow voids are preserved Skull and upper cervical spine: Normal marrow signal. Sinuses/Orbits: Negative Other: Hypertrophic appearance of the palatine tonsils which is only partially covered and symmetric. IMPRESSION: 1. Expected appearance of right basal ganglia hematoma with rim of edema. No evident progression or CSF extension. 2. Background of chronic white matter disease with remote micro hemorrhages in a hypertensive pattern. 3. Hypertrophic palatine tonsils, notable for age. Electronically Signed   By: Marnee Spring M.D.   On: 10/11/2020 05:32   ECHOCARDIOGRAM COMPLETE  Result Date: 10/10/2020    ECHOCARDIOGRAM REPORT   Patient Name:   JERAULD BOSTWICK  Date of Exam: 10/10/2020 Medical Rec #:  [id number]  Height:       60.0 in Accession #:    [id number] Weight:       140.0 lb Date of Birth:  Jun 29, 1961   BSA:          1.604 m Patient Age:    60 years   BP:           136/80 mmHg Patient Gender: M          HR:           101 bpm. Exam Location:  Inpatient Procedure: 2D Echo, Cardiac Doppler and Color Doppler Indications:    Stroke I163.9  History:        Patient has no prior history of Echocardiogram examinations.                 Risk Factors:Hypertension. COVID-19.  Sonographer:    Elmarie Shiley Dance Referring Phys: [id number] HUNTER J COLLINS IMPRESSIONS  1. Left ventricular ejection fraction, by estimation, is 55 to 60%. The  left ventricle has normal function. The left ventricle has no regional wall motion abnormalities. Left ventricular diastolic parameters are consistent with Grade I diastolic dysfunction (impaired relaxation).  2. Right ventricular systolic function is normal. The right ventricular size is normal.  3. Left atrial size was mildly dilated.  4. The mitral valve is normal in structure. Trivial mitral valve regurgitation. No evidence of mitral stenosis.  5. The aortic valve is normal in structure. Aortic valve regurgitation is not visualized. No aortic stenosis is present.  6. Aortic dilatation noted. There is mild dilatation of the aortic root, measuring 39 mm.  7. The inferior vena cava is normal in size with greater than 50% respiratory variability, suggesting right atrial pressure of 3 mmHg. FINDINGS  Left Ventricle: Left ventricular ejection fraction, by estimation, is 55 to 60%. The left ventricle has normal function. The left ventricle has no regional wall motion abnormalities. The left ventricular internal cavity size was normal in size. There is  no left ventricular hypertrophy. Left ventricular diastolic parameters are consistent with Grade I diastolic dysfunction (impaired relaxation). Right Ventricle: The right ventricular size is normal. No increase in right ventricular wall thickness. Right ventricular systolic function is normal. Left Atrium: Left atrial size  was mildly dilated. Right Atrium: Right atrial size was normal in size. Pericardium: There is no evidence of pericardial effusion. Mitral Valve: The mitral valve is normal in structure. Trivial mitral valve regurgitation. No evidence of mitral valve stenosis. Tricuspid Valve: The tricuspid valve is normal in structure. Tricuspid valve regurgitation is not demonstrated. No evidence of tricuspid stenosis. Aortic Valve: The aortic valve is normal in structure. Aortic valve regurgitation is not visualized. Aortic regurgitation PHT measures 295 msec. No  aortic stenosis is present. Pulmonic Valve: The pulmonic valve was normal in structure. Pulmonic valve regurgitation is not visualized. No evidence of pulmonic stenosis. Aorta: Aortic dilatation noted. There is mild dilatation of the aortic root, measuring 39 mm. Venous: The inferior vena cava is normal in size with greater than 50% respiratory variability, suggesting right atrial pressure of 3 mmHg. IAS/Shunts: No atrial level shunt detected by color flow Doppler.  LEFT VENTRICLE PLAX 2D LVIDd:         4.80 cm LVIDs:         3.30 cm LV PW:         1.50 cm LV IVS:        1.00 cm LVOT diam:     2.10 cm LV SV:         74 LV SV Index:   46 LVOT Area:     3.46 cm  RIGHT VENTRICLE             IVC RV Basal diam:  2.50 cm     IVC diam: 1.80 cm RV S prime:     17.00 cm/s TAPSE (M-mode): 2.5 cm LEFT ATRIUM             Index       RIGHT ATRIUM           Index LA diam:        4.20 cm 2.62 cm/m  RA Area:     14.40 cm LA Vol (A2C):   63.8 ml 39.77 ml/m RA Volume:   34.60 ml  21.57 ml/m LA Vol (A4C):   49.6 ml 30.92 ml/m LA Biplane Vol: 56.4 ml 35.16 ml/m  AORTIC VALVE LVOT Vmax:   96.70 cm/s LVOT Vmean:  67.800 cm/s LVOT VTI:    0.213 m AI PHT:      295 msec  AORTA Ao Root diam: 3.10 cm Ao Asc diam:  3.90 cm  SHUNTS Systemic VTI:  0.21 m Systemic Diam: 2.10 cm Donato Schultz MD Electronically signed by Donato Schultz MD Signature Date/Time: 10/10/2020/2:06:39 PM    Final    CT HEAD CODE STROKE WO CONTRAST  Result Date: 10/10/2020 CLINICAL DATA:  Code stroke.  Left facial weakness EXAM: CT HEAD WITHOUT CONTRAST TECHNIQUE: Contiguous axial images were obtained from the base of the skull through the vertex without intravenous contrast. COMPARISON:  None. FINDINGS: Brain: 2.5 x 2 x 3.6 cm high-density hematoma centered at the posterolateral right putamen and external capsule. Minimal adjacent edema. There is history of hypertension. Mild white matter low-density, usually chronic microvascular ischemia. No evidence of cortical  infarct. No hydrocephalus or midline shift. Vascular: No hyperdense vessel or unexpected calcification. Skull: Normal. Negative for fracture or focal lesion. Sinuses/Orbits: No acute finding. Other: Right suboccipital scarring. ASPECTS [facility] Stroke Program Early CT Score) Not scored in the setting Critical Value/emergent results were called by telephone at the time of interpretation on 10/10/2020 at 5:17 am to provider [facility] , who verbally acknowledged these results. IMPRESSION: 9 cc acute  hematoma at the posterior right putamen and adjacent white matter. Electronically Signed   By: Marnee Spring M.D.   On: 10/10/2020 05:28      Subjective: - no chest pain, shortness of breath, no abdominal pain, nausea or vomiting.   Discharge Exam: BP (!) 154/95 (BP Location: Left Arm)   Pulse 83   Temp 97.9 F (36.6 C) (Oral)   Resp 19   SpO2 96%   General: Pt is alert, awake, not in acute distress Cardiovascular: RRR, S1/S2 +, no rubs, no gallops Respiratory: CTA bilaterally, no wheezing, no rhonchi Abdominal: Soft, NT, ND, bowel sounds + Extremities: no edema, no cyanosis    The results of significant diagnostics from this hospitalization (including imaging, microbiology, ancillary and laboratory) are listed below for reference.     Microbiology: Recent Results (from the past 240 hour(s))  Resp Panel by RT-PCR (Flu A&B, Covid) Nasopharyngeal Swab     Status: None   Collection Time: 10/10/20  5:16 AM   Specimen: Nasopharyngeal Swab; Nasopharyngeal(NP) swabs in vial transport medium  Result Value Ref Range Status   SARS Coronavirus 2 by RT PCR NEGATIVE NEGATIVE Final    Comment: (NOTE) SARS-CoV-2 target nucleic acids are NOT DETECTED.  The SARS-CoV-2 RNA is generally detectable in upper respiratory specimens during the acute phase of infection. The lowest concentration of SARS-CoV-2 viral copies this assay can detect is 138 copies/mL. A negative result does not preclude  SARS-Cov-2 infection and should not be used as the sole basis for treatment or other patient management decisions. A negative result may occur with  improper specimen collection/handling, submission of specimen other than nasopharyngeal swab, presence of viral mutation(s) within the areas targeted by this assay, and inadequate number of viral copies(<138 copies/mL). A negative result must be combined with clinical observations, patient history, and epidemiological information. The expected result is Negative.  Fact Sheet for Patients:  BloggerCourse.com  Fact Sheet for Healthcare Providers:  SeriousBroker.it  This test is no t yet approved or cleared by the [place] FDA and  has been authorized for detection and/or diagnosis of SARS-CoV-2 by FDA under an Emergency Use Authorization (EUA). This EUA will remain  in effect (meaning this test can be used) for the duration of the COVID-19 declaration under Section 564(b)(1) of the Act, 21 U.S.C.section 360bbb-3(b)(1), unless the authorization is terminated  or revoked sooner.       Influenza A by PCR NEGATIVE NEGATIVE Final   Influenza B by PCR NEGATIVE NEGATIVE Final    Comment: (NOTE) The Xpert Xpress SARS-CoV-2/FLU/RSV plus assay is intended as an aid in the diagnosis of influenza from Nasopharyngeal swab specimens and should not be used as a sole basis for treatment. Nasal washings and aspirates are unacceptable for Xpert Xpress SARS-CoV-2/FLU/RSV testing.  Fact Sheet for Patients: BloggerCourse.com  Fact Sheet for Healthcare Providers: SeriousBroker.it  This test is not yet approved or cleared by the [place] FDA and has been authorized for detection and/or diagnosis of SARS-CoV-2 by FDA under an Emergency Use Authorization (EUA). This EUA will remain in effect (meaning this test can be used) for the duration of  the COVID-19 declaration under Section 564(b)(1) of the Act, 21 U.S.C. section 360bbb-3(b)(1), unless the authorization is terminated or revoked.  Performed at [facility], [address], [place], [place] [address]   MRSA PCR Screening     Status: None   Collection Time: 10/10/20  4:31 PM   Specimen: Nasopharyngeal  Result Value Ref Range  Status   MRSA by PCR NEGATIVE NEGATIVE Final    Comment:        The GeneXpert MRSA Assay (FDA approved for NASAL specimens only), is one component of a comprehensive MRSA colonization surveillance program. It is not intended to diagnose MRSA infection nor to guide or monitor treatment for MRSA infections. Performed at [facility], [address] [place], [place] [address]      Labs: Basic Metabolic Panel: Recent Labs  Lab 10/10/20 0502 10/10/20 0507 10/10/20 0603 10/11/20 0154 10/12/20 0012 10/13/20 0401  NA 133* 137  --  139 136 136  K 3.1* 3.1*  --  3.2* 3.7 3.8  CL 99 99  --  104 104 102  CO2 22  --   --  25 24 26   GLUCOSE 168* 170*  --  121* 120* 120*  BUN 17 20  --  13 17 19   CREATININE 1.00 0.90  --  0.98 1.26* 1.10  CALCIUM 8.9  --   --  9.2 9.1 9.3  MG  --   --  1.8  --   --   --   PHOS  --   --  2.0*  --  2.5  --    Liver Function Tests: Recent Labs  Lab 10/10/20 0502  AST 23  ALT 17  ALKPHOS 67  BILITOT 0.6  PROT 7.9  ALBUMIN 3.8   CBC: Recent Labs  Lab 10/10/20 0502 10/10/20 0507 10/11/20 0154 10/12/20 0012 10/13/20 0401  WBC 12.2*  --  9.4 10.9* 10.3  NEUTROABS 6.4  --   --   --   --   HGB 14.2 16.7 14.3 14.2 14.3  HCT 44.8 49.0 44.6 45.5 44.5  MCV 67.9*  --  66.0* 67.0* 66.5*  PLT 221  --  220 223 223   CBG: Recent Labs  Lab 10/10/20 0458  GLUCAP 192*   Hgb A1c No results for input(s): HGBA1C in the last 72 hours. Lipid Profile No results for input(s): CHOL, HDL, LDLCALC, TRIG, CHOLHDL, LDLDIRECT in the last 72 hours. Thyroid function studies No results for input(s):  TSH, T4TOTAL, T3FREE, THYROIDAB in the last 72 hours.  Invalid input(s): FREET3 Urinalysis    Component Value Date/Time   COLORURINE STRAW (A) 10/10/2020 0826   APPEARANCEUR CLEAR 10/10/2020 0826   LABSPEC 1.011 10/10/2020 0826   PHURINE 6.0 10/10/2020 0826   GLUCOSEU 50 (A) 10/10/2020 0826   HGBUR NEGATIVE 10/10/2020 0826   BILIRUBINUR NEGATIVE 10/10/2020 0826   KETONESUR NEGATIVE 10/10/2020 0826   PROTEINUR 30 (A) 10/10/2020 0826   NITRITE NEGATIVE 10/10/2020 0826   LEUKOCYTESUR NEGATIVE 10/10/2020 0826    FURTHER DISCHARGE INSTRUCTIONS:   Get Medicines reviewed and adjusted: Please take all your medications with you for your next visit with your Primary MD   Laboratory/radiological data: Please request your Primary MD to go over all hospital tests and procedure/radiological results at the follow up, please ask your Primary MD to get all Hospital records sent to his/her office.   In some cases, they will be blood work, cultures and biopsy results pending at the time of your discharge. Please request that your primary care M.D. goes through all the records of your hospital data and follows up on these results.   Also Note the following: If you experience worsening of your admission symptoms, develop shortness of breath, life threatening emergency, suicidal or homicidal thoughts you must seek medical attention immediately by calling 911 or calling your MD  immediately  if symptoms less severe.   You must read complete instructions/literature along with all the possible adverse reactions/side effects for all the Medicines you take and that have been prescribed to you. Take any new Medicines after you have completely understood and accpet all the possible adverse reactions/side effects.    Do not drive when taking Pain medications or sleeping medications (Benzodaizepines)   Do not take more than prescribed Pain, Sleep and Anxiety Medications. It is not advisable to combine  anxiety,sleep and pain medications without talking with your primary care practitioner   Special Instructions: If you have smoked or chewed Tobacco  in the last 2 yrs please stop smoking, stop any regular Alcohol  and or any Recreational drug use.   Wear Seat belts while driving.   Please note: You were cared for by a hospitalist during your hospital stay. Once you are discharged, your primary care physician will handle any further medical issues. Please note that NO REFILLS for any discharge medications will be authorized once you are discharged, as it is imperative that you return to your primary care physician (or establish a relationship with a primary care physician if you do not have one) for your post hospital discharge needs so that they can reassess your need for medications and monitor your lab values.  Time coordinating discharge: 40 minutes  SIGNED:  Pamella Pert, MD, PhD 10/13/2020, 12:10 PM

## 2020-10-13 NOTE — TOC Transition Note (Signed)
Transition of Care [facility]) - CM/SW Discharge Note   Patient Details  Name: Mark Bass MRN: [id number] Date of Birth: 10/15/1960  [facility] CM/SW Contact:  Pollie Friar, RN Phone Number: 10/13/2020, 1:51 PM   Clinical Narrative:    Pt and spouse have decided to d/c home with outpatient therapy. CM met with them and used the interpreter services to provide choice. They are interested in attending [facility]. Orders in Epic and information on the AVS. Pt without a PCP. CM was able to obtain an appointment and placed it on the AVS. Walker and 3 in 1 to be delivered to the room per [employer]. Pt has supervision at home and transportation to home.   Final next level of care: OP Rehab Barriers to Discharge: No Barriers Identified   Patient Goals and CMS Choice     Choice offered to / list presented to : [facility]  Discharge Placement                       Discharge Plan and Services                DME Arranged: 3-N-1,[facility] youth DME Agency: [employer] Date DME Agency Contacted: 10/13/20   Representative spoke with at DME Agency: [facility] Determinants of Health ([place]) Interventions     Readmission Risk Interventions No flowsheet data found.

## 2020-10-13 NOTE — Progress Notes (Signed)
Inpatient Rehabilitation Admissions Coordinator  I met with patient and his wife using [employer] interpretor # [id number] Vivianne Spence. I discussed a possible CIR admit. They prefer discharge directly home if possible with HH. They have wife and 3 sons who can provide assist. I have updated acute team and TOC. They ask for therapy to see him today to give their opinion if felt Home with [facility] realistic.  Danne Baxter, RN, MSN Rehab Admissions Coordinator [phone] 10/13/2020 10:49 AM

## 2020-10-28 ENCOUNTER — Encounter (INDEPENDENT_AMBULATORY_CARE_PROVIDER_SITE_OTHER): Payer: Self-pay | Admitting: Primary Care

## 2020-10-28 ENCOUNTER — Ambulatory Visit (INDEPENDENT_AMBULATORY_CARE_PROVIDER_SITE_OTHER): Payer: 59 | Admitting: Primary Care

## 2020-10-28 ENCOUNTER — Other Ambulatory Visit: Payer: Self-pay

## 2020-10-28 VITALS — BP 142/89 | HR 71 | Temp 97.3°F | Ht 60.0 in | Wt 134.6 lb

## 2020-10-28 DIAGNOSIS — Z09 Encounter for follow-up examination after completed treatment for conditions other than malignant neoplasm: Secondary | ICD-10-CM | POA: Diagnosis not present

## 2020-10-28 DIAGNOSIS — Z7689 Persons encountering health services in other specified circumstances: Secondary | ICD-10-CM | POA: Diagnosis not present

## 2020-10-28 DIAGNOSIS — I10 Essential (primary) hypertension: Secondary | ICD-10-CM

## 2020-10-28 DIAGNOSIS — G44209 Tension-type headache, unspecified, not intractable: Secondary | ICD-10-CM

## 2020-10-28 DIAGNOSIS — I619 Nontraumatic intracerebral hemorrhage, unspecified: Secondary | ICD-10-CM

## 2020-10-28 DIAGNOSIS — Z1211 Encounter for screening for malignant neoplasm of colon: Secondary | ICD-10-CM

## 2020-10-28 MED ORDER — ATORVASTATIN CALCIUM 40 MG PO TABS: 40.0000 mg | ORAL_TABLET | Freq: Every day | ORAL | 1 refills | Status: DC

## 2020-10-28 MED ORDER — HYDROCHLOROTHIAZIDE 25 MG PO TABS: 25.0000 mg | ORAL_TABLET | Freq: Every day | ORAL | 1 refills | Status: DC

## 2020-10-28 MED ORDER — AMLODIPINE BESYLATE 10 MG PO TABS: 10.0000 mg | ORAL_TABLET | Freq: Every day | ORAL | 1 refills | Status: DC

## 2020-10-28 MED ORDER — LOSARTAN POTASSIUM 50 MG PO TABS: 50.0000 mg | ORAL_TABLET | Freq: Two times a day (BID) | ORAL | 1 refills | Status: DC

## 2020-10-28 MED ORDER — METOPROLOL TARTRATE 25 MG PO TABS: 25.0000 mg | ORAL_TABLET | Freq: Two times a day (BID) | ORAL | 1 refills | Status: DC

## 2020-10-28 NOTE — Patient Instructions (Addendum)
[facility] [place], [place] Address: [address], [place], [place] [address] Hours:  Open ? Closes 5PM Phone: [phone] Hypertension, Adult High blood pressure (hypertension) is when the force of blood pumping through the arteries is too strong. The arteries are the blood vessels that carry blood from the heart throughout the body. Hypertension forces the heart to work harder to pump blood and may cause arteries to become narrow or stiff. Untreated or uncontrolled hypertension can cause a heart attack, heart failure, a stroke, kidney disease, and other problems. A blood pressure reading consists of a higher number over a lower number. Ideally, your blood pressure should be below 120/80. The first ("top") number is called the systolic pressure. It is a measure of the pressure in your arteries as your heart beats. The second ("bottom") number is called the diastolic pressure. It is a measure of the pressure in your arteries as the heart relaxes. What are the causes? The exact cause of this condition is not known. There are some conditions that result in or are related to high blood pressure. What increases the risk? Some risk factors for high blood pressure are under your control. The following factors may make you more likely to develop this condition:  Smoking.  Having type 2 diabetes mellitus, high cholesterol, or both.  Not getting enough exercise or physical activity.  Being overweight.  Having too much fat, sugar, calories, or salt (sodium) in your diet.  Drinking too much alcohol. Some risk factors for high blood pressure may be difficult or impossible to change. Some of these factors include:  Having chronic kidney disease.  Having a family history of high blood pressure.  Age. Risk increases with age.  Race. You may be at higher risk if you are African American.  Gender. Men are at higher risk than women  before age 49. After age 39, women are at higher risk than men.  Having obstructive sleep apnea.  Stress. What are the signs or symptoms? High blood pressure may not cause symptoms. Very high blood pressure (hypertensive crisis) may cause:  Headache.  Anxiety.  Shortness of breath.  Nosebleed.  Nausea and vomiting.  Vision changes.  Severe chest pain.  Seizures. How is this diagnosed? This condition is diagnosed by measuring your blood pressure while you are seated, with your arm resting on a flat surface, your legs uncrossed, and your feet flat on the floor. The cuff of the blood pressure monitor will be placed directly against the skin of your upper arm at the level of your heart. It should be measured at least twice using the same arm. Certain conditions can cause a difference in blood pressure between your right and left arms. Certain factors can cause blood pressure readings to be lower or higher than normal for a short period of time:  When your blood pressure is higher when you are in a health care provider's office than when you are at home, this is called white coat hypertension. Most people with this condition do not need medicines.  When your blood pressure is higher at home than when you are in a health care provider's office, this is called masked hypertension. Most people with this condition may need medicines to control blood pressure. If you have a high blood pressure reading during one visit or you have normal blood pressure with other risk factors, you may be asked to:  Return on a different day to have your blood  pressure checked again.  Monitor your blood pressure at home for 1 week or longer. If you are diagnosed with hypertension, you may have other blood or imaging tests to help your health care provider understand your overall risk for other conditions. How is this treated? This condition is treated by making healthy lifestyle changes, such as eating  healthy foods, exercising more, and reducing your alcohol intake. Your health care provider may prescribe medicine if lifestyle changes are not enough to get your blood pressure under control, and if:  Your systolic blood pressure is above 130.  Your diastolic blood pressure is above 80. Your personal target blood pressure may vary depending on your medical conditions, your age, and other factors. Follow these instructions at home: Eating and drinking  Eat a diet that is high in fiber and potassium, and low in sodium, added sugar, and fat. An example eating plan is called the DASH (Dietary Approaches to Stop Hypertension) diet. To eat this way: ? Eat plenty of fresh fruits and vegetables. Try to fill one half of your plate at each meal with fruits and vegetables. ? Eat whole grains, such as whole-wheat pasta, brown rice, or whole-grain bread. Fill about one fourth of your plate with whole grains. ? Eat or drink low-fat dairy products, such as skim milk or low-fat yogurt. ? Avoid fatty cuts of meat, processed or cured meats, and poultry with skin. Fill about one fourth of your plate with lean proteins, such as fish, chicken without skin, beans, eggs, or tofu. ? Avoid pre-made and processed foods. These tend to be higher in sodium, added sugar, and fat.  Reduce your daily sodium intake. Most people with hypertension should eat less than 1,500 mg of sodium a day.  Do not drink alcohol if: ? Your health care provider tells you not to drink. ? You are pregnant, may be pregnant, or are planning to become pregnant.  If you drink alcohol: ? Limit how much you use to:  0-1 drink a day for women.  0-2 drinks a day for men. ? Be aware of how much alcohol is in your drink. In the [place] one drink equals one 12 oz bottle of beer (355 mL), one 5 oz glass of wine (148 mL), or one 1 oz glass of hard liquor (44 mL).   Lifestyle  Work with your health care provider to maintain a healthy body weight or  to lose weight. Ask what an ideal weight is for you.  Get at least 30 minutes of exercise most days of the week. Activities may include walking, swimming, or biking.  Include exercise to strengthen your muscles (resistance exercise), such as Pilates or lifting weights, as part of your weekly exercise routine. Try to do these types of exercises for 30 minutes at least 3 days a week.  Do not use any products that contain nicotine or tobacco, such as cigarettes, e-cigarettes, and chewing tobacco. If you need help quitting, ask your health care provider.  Monitor your blood pressure at home as told by your health care provider.  Keep all follow-up visits as told by your health care provider. This is important.   Medicines  Take over-the-counter and prescription medicines only as told by your health care provider. Follow directions carefully. Blood pressure medicines must be taken as prescribed.  Do not skip doses of blood pressure medicine. Doing this puts you at risk for problems and can make the medicine less effective.  Ask your health care provider about  side effects or reactions to medicines that you should watch for. Contact a health care provider if you:  Think you are having a reaction to a medicine you are taking.  Have headaches that keep coming back (recurring).  Feel dizzy.  Have swelling in your ankles.  Have trouble with your vision. Get help right away if you:  Develop a severe headache or confusion.  Have unusual weakness or numbness.  Feel faint.  Have severe pain in your chest or abdomen.  Vomit repeatedly.  Have trouble breathing. Summary  Hypertension is when the force of blood pumping through your arteries is too strong. If this condition is not controlled, it may put you at risk for serious complications.  Your personal target blood pressure may vary depending on your medical conditions, your age, and other factors. For most people, a normal blood  pressure is less than 120/80.  Hypertension is treated with lifestyle changes, medicines, or a combination of both. Lifestyle changes include losing weight, eating a healthy, low-sodium diet, exercising more, and limiting alcohol. This information is not intended to replace advice given to you by your health care provider. Make sure you discuss any questions you have with your health care provider. Document Revised: 04/04/2018 Document Reviewed: 04/04/2018 Elsevier Patient Education  2021 [employer].

## 2020-10-28 NOTE — Progress Notes (Signed)
CC: Hospital follow    HPI Mr. Abdulla Pooley is a 60 y.o [place] (Speaks [place] son Ronni Rumble is interpreting for father with in permission) who presents for follow up from the hospital. Admit date to the hospital was 10/10/20, patient was discharged from the hospital on 10/13/20, patient was admitted for: Stroke, hemorrhagic.Son called EMS got up feeling dizzy with a headache, tried to get up from the bed and collapse. Patient  stopped taking antihypertensive medications after taking it for 2 years because he was feeling better.  Denies shortness of breath, chest pain or lower extremity edema.He admits to having a headaches several times a week. Takes Tylenol 325 mg 1-2 depending on how bad his head hurts.  Past Medical History:  Diagnosis Date  . Hypertension      No Known Allergies    Current Outpatient Medications on File Prior to Visit  Medication Sig Dispense Refill  . amLODipine (NORVASC) 10 MG tablet Take 1 tablet (10 mg total) by mouth daily. 30 tablet 1  . atorvastatin (LIPITOR) 40 MG tablet Take 1 tablet (40 mg total) by mouth daily. 30 tablet 1  . losartan (COZAAR) 50 MG tablet Take 1 tablet (50 mg total) by mouth 2 (two) times daily. 30 tablet 1  . metoprolol tartrate (LOPRESSOR) 25 MG tablet Take 1 tablet (25 mg total) by mouth 2 (two) times daily. 60 tablet 1   No current facility-administered medications on file prior to visit.    ROS:  Review of Systems  Neurological: Positive for tingling, weakness and headaches.       Left side s/p CVA  All other systems reviewed and are negative.   Physical Exam: BP (!) 142/89 (BP Location: Right Arm, Patient Position: Sitting, Cuff Size: Normal)   Pulse 71   Temp (!) 97.3 F (36.3 C) (Temporal)   Ht 5' (1.524 m)   Wt 134 lb 9.6 oz (61.1 kg)   SpO2 95%   BMI 26.29 kg/m  General Appearance: Well nourished, in no apparent distress. Eyes: PERRLA, EOMs, conjunctiva no swelling or erythema Sinuses: No  Frontal/maxillary tenderness ENT- Ext aud canals clear, TMs without erythema, bulging. Hearing normal.  Neck: Supple, thyroid normal.  Respiratory: Respiratory effort normal, BS equal bilaterally without rales, rhonchi, wheezing or stridor.  Cardio: RRR with no MRGs. Brisk peripheral pulses without edema.  Abdomen: Soft, + BS.  Non tender, no guarding, rebound, hernias, masses. Lymphatics: Non tender without lymphadenopathy.  Musculoskeletal: weakness on left side normal gait.  Skin: Warm, dry without rashes, lesions, ecchymosis. Marland Kitchen  Psych: Awake and oriented X 3, normal affect, Insight and Judgment appropriate.    Marcas was seen today for hospitalization follow-up.  Diagnoses and all orders for this visit:  Hospital discharge follow-up Hospital discharge appt @ [facility] establish PCP  Follow up with [facility] (Rehabilitation); The outpatient rehab will contact you for the first appointment. Patient speaks no English if called unable to communicate.  Information provided on AVS to called and schedule an appt . Ronni Rumble( son)  Stroke, hemorrhagic [facility]) Per d/c F/u with  [facility]);with left side weakness   Essential hypertension Counseled on blood pressure goal of less than 130/80, low-sodium, DASH diet, medication compliance, 150 minutes of moderate intensity exercise per week. Uncontrolled added HCTZ 25mg  daily and continue losartan 50 mg daily metoprolol 25 mg twice daily amlodipine.  Discussed with patient even if feeling better you continue to take medication as prescribed.  However  if the medication makes you feel sick then call the office and we will reevaluate medication. Discussed medication compliance, adverse effects.  Encounter to establish care Establish care with new PCP  Tension headache [place] to [place] pain . He states this hasiImproved but admits to light also bothering  him. Metoprolol can also help   Other orders -     losartan (COZAAR) 50 MG tablet; Take 1 tablet (50 mg total) by mouth 2 (two) times daily. -     atorvastatin (LIPITOR) 40 MG tablet; Take 1 tablet (40 mg total) by mouth daily. -     metoprolol tartrate (LOPRESSOR) 25 MG tablet; Take 1 tablet (25 mg total) by mouth 2 (two) times daily. -     amLODipine (NORVASC) 10 MG tablet; Take 1 tablet (10 milligrams total) by mouth daily. -     hydrochlorothiazide (HYDRODIURIL) 25 MG tablet; Take 1 tablet (25 mg total) by mouth daily.   Grayce Sessions, NP 9:38 AM

## 2020-11-11 ENCOUNTER — Inpatient Hospital Stay: Payer: 59 | Admitting: Adult Health

## 2020-11-12 ENCOUNTER — Ambulatory Visit: Payer: 59 | Attending: Internal Medicine

## 2020-11-12 ENCOUNTER — Encounter: Payer: Self-pay | Admitting: Adult Health

## 2020-11-12 ENCOUNTER — Ambulatory Visit (INDEPENDENT_AMBULATORY_CARE_PROVIDER_SITE_OTHER): Payer: 59 | Admitting: Adult Health

## 2020-11-12 ENCOUNTER — Other Ambulatory Visit: Payer: Self-pay

## 2020-11-12 ENCOUNTER — Inpatient Hospital Stay: Payer: 59 | Admitting: Adult Health

## 2020-11-12 VITALS — BP 126/87 | HR 88 | Ht 61.0 in | Wt 129.0 lb

## 2020-11-12 DIAGNOSIS — I610 Nontraumatic intracerebral hemorrhage in hemisphere, subcortical: Secondary | ICD-10-CM | POA: Diagnosis not present

## 2020-11-12 DIAGNOSIS — G8194 Hemiplegia, unspecified affecting left nondominant side: Secondary | ICD-10-CM

## 2020-11-12 DIAGNOSIS — R2689 Other abnormalities of gait and mobility: Secondary | ICD-10-CM | POA: Insufficient documentation

## 2020-11-12 DIAGNOSIS — M6281 Muscle weakness (generalized): Secondary | ICD-10-CM | POA: Insufficient documentation

## 2020-11-12 NOTE — Therapy (Signed)
OUTPATIENT PHYSICAL THERAPY NEURO EVALUATION   Patient Name: Mark Bass MRN: [id number] DOB:1960/12/30, 60 y.o., male Today's Date: 11/13/2020  PCP: Grayce Sessions, NP REFERRING PROVIDER: Dr. Sula Soda  PT End of Session - 11/12/20 0734    Visit Number 1    Number of Visits 9    Date for PT Re-Evaluation 01/08/21    Authorization Type [employer]    Progress Note Due on Visit 8    PT Start Time 1315    PT Stop Time 1400    PT Time Calculation (min) 45 min    Equipment Utilized During Treatment Gait belt    Activity Tolerance Patient tolerated treatment well    Behavior During Therapy WFL for tasks assessed/performed           Past Medical History:  Diagnosis Date  . Hypertension    Past Surgical History:  Procedure Laterality Date  . NECK SURGERY     Patient Active Problem List   Diagnosis Date Noted  . Stroke, 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HCC) 10/10/2020  . COVID-19 virus infection 01/02/2019  . Pneumonia due to COVID-19 virus 01/01/2019  . Elevated blood pressure reading without diagnosis of hypertension 01/01/2019  . Dehydration 01/01/2019  . Sepsis (HCC) 01/01/2019    Onset date: 10/10/20  REFERRING DIAG: I61.9 (ICD-10-CM) - Stroke, 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HCC)    THERAPY DIAG:  Other abnormalities of gait and mobility  Muscle weakness (generalized)  SUBJECTIVE:   PATIENT HISTORY: pt reports he got up and felt dizzy and had headache on 10/10/20. His wife found him on bathroom floor. He was taken to ED by EMS and was found to have stroke.                                                                                                                                                      Language: Vietnamese                                                     PAIN:  Are you having pain? No   PRECAUTIONS: None  WEIGHT BEARING RESTRICTIONS No  FALLS: Has patient fallen in last 6 months? No  LIVING ENVIRONMENT: Lives with: places; lives with: including wife, 2 sons and 1  daughter. 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 Expected appearance of right basal ganglia hematoma with rim of edema. No evident progression or CSF extension. 2. Background of chronic white matter disease  with remote micro hemorrhages in a hypertensive pattern. 3. Hypertrophic palatine tonsils, notable for age.   COGNITION: Overall cognitive status: Within functional limits for tasks assessed     MMT:  MMT Right 11/13/2020 Left 11/13/2020  Hip flexion 5/5 3+/5  Hip abduction 5/5 3+/5  Hip adduction 5/5 3+/5  Knee flexion 5/5 3+/5  Knee extension 5/5 3+/5  Ankle dorsiflexion 5/5 3+/5  Ankle plantarflexion 5/5 (able to complete 20 uni heel raises with min to mod UE support) 3+/5 (7 unilateral heel raises with excessive UE support bil)    BED MOBILITY: Ind all   TRANSFERS: Ind all  GAIT: Gait pattern: decreased arm swing- Left, decreased stance time- Left, decreased hip/knee flexion- Left, decreased ankle dorsiflexion- Left, antalgic, decreased trunk rotation and poor foot clearance- Left Distance walked:134f Assistive device utilized: None Level of assistance: Complete Independence  FUNCTIONAL TESTs:  5 times sit to stand: 14 10 meter walk test: 0.90 m/s  PATIENT SURVEYS:  FOTO 59 (Predicted 69)  TODAY'S TREATMENT:  11/12/20:  Bil heel raises: 2 x 10  Sit to stand: 2 x 10  Static lunges with one UE support: 2 x 5 R and L    PATIENT EDUCATION: Education details: Patient and Son Person educated: Patient Education method: Medical illustrator Education comprehension: verbalized understanding, verbal cues required and needs further education   HOME EXERCISE PROGRAM: Access Code: [email or web address] URL:  [email or web address] Date: 11/13/2020 Prepared by: Lavone Nian  Exercises Standing Heel Raise with Support - 2 x daily - 7 x weekly - 2 sets - 10 reps Sit to Stand with Arms Crossed - 2 x daily - 7 x weekly - 2 sets - 10 reps Lunge with Counter Support - 1 x daily - 7 x weekly - 2 sets - 5 reps   ASSESSMENT:  CLINICAL IMPRESSION: Patient is a 60 y.o. male who was seen today for genarlized muscle weakness and gait and balance disorder due to recent stroke. Objective impairments include Abnormal gait, decreased activity tolerance, decreased balance, decreased endurance, decreased knowledge of condition, difficulty walking, decreased strength, impaired flexibility, impaired tone, impaired UE functional use and postural dysfunction. These impairments are limiting patient from cleaning, community activity, driving, meal prep, occupation, laundry, yard work and yard work. Personal factors including Age, Past/current experiences, Time since onset of injury/illness/exacerbation and 1 comorbidity: HTN are also affecting patient's functional outcome. Patient will benefit from skilled PT to address above impairments and improve overall function.  REHAB POTENTIAL: Good  CLINICAL DECISION MAKING: Stable/uncomplicated  EVALUATION COMPLEXITY: Low   GOALS: Goals reviewed with patient? Yes  SHORT TERM GOALS: STG=LTG  LONG TERM GOALS:   LTG Name Target Date Goal status  1 Patient will demo at least 4/5 strength in L LE grossly to improve functional transfers Comments:  01/08/21 INITIAL  2 Pt will demo gait speed increase by 0.10 m/s to improve functional ambulation. Comments:  01/08/21 INITIAL  3 Pt will be I and compliant with HEP to self manage his symptoms. Comments:  01/08/21 INITIAL   PLAN: PT FREQUENCY: 1-2x/week  PT DURATION: 8 weeks  PLANNED INTERVENTIONS: Therapeutic exercises, Therapeutic activity, Neuro Muscular re-education, Balance training, Gait training,  Patient/Family education, Joint mobilization, Stair training, Orthotic/Fit training, Electrical stimulation, Cryotherapy and Moist heat  PLAN FOR NEXT SESSION: Add ankle dorsiflexion exercises, resisted walking, review HEP   Ileana Ladd, PT 11/13/2020, 7:35 AM  [facility] [address] [place], [place], [address] Phone: [phone]  Fax:  [phone]

## 2020-11-12 NOTE — Progress Notes (Signed)
[facility] [address] [place]. Mark Bass [id number] [phone]       HOSPITAL FOLLOW UP NOTE  [name] Date of Birth:  May 03, 1961 Medical Record Number:  [id number]   Reason for Referral:  hospital stroke follow up    SUBJECTIVE:   CHIEF COMPLAINT:  Chief Complaint  Patient presents with  . Follow-up    Rm 14 with son Mark Bass) (also cone interpreter) Pt is having L sided numbness, head feels heavy and very fatigued     HPI:   [name] is a 60 y.o. male with history of HTN and medication non-compliance. Presented to [facility] ED on 10/10/2020 after awakening at 4am to go to the bathroom and became dizzy with headache as well as difficulty standing and left facial droop.  Personally reviewed hospitalization pertinent progress notes, lab work and imaging with summary provided.  Evaluated by Dr. 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  BP stabilized on Cleviprex and started amlodipine, losartan and metoprolol.  LDL 110 -initiated atorvastatin 40 mg daily at discharge.  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  Evaluated by therapies initially recommending CIR but pt and wife preferred discharge directly home with [facility] or OP therapies  ICH: Right BG, likely due to HTN etiology.   CT head - right BG ICH, ~ 10cc  CTA head & neck: severe athro disease, but no underlying AVM or etiology for bleed. No Spot sign.   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 No underlying mass.  EF 55-60%  LDL 110  HgbA1c 6.6  UDS negative  VTE prophylaxis - SCDs  No antithrombotics PTA, now not on antithrombotics due to ICH  Therapy recommendation - CIR --> HH or OP PT/OT  Deposition - home   Today, 11/12/2020, [name] is being seen for hospital follow-up accompanied by  his son, Mark Bass, and a [place] interpreter   Stable since discharge without new stroke/TIA symptoms Reports residual left arm and leg weakness and numbness - has been slowly improving  Initial PT evaluation today - has not yet been called to schedule OT Previously working as a Production designer, theatre/television/film in a warehouse - currently in the process of applying for short term disability He is living his wife and 2 children at home - able to maintain ADLs independently but does have supervision  Compliant on Lipitor - denies associated side effects Blood pressure today 126/87 - monitors BP at home typically 130s  No further concerns at this time     ROS:   14 system review of systems performed and negative with exception of those listed in HPI  PMH:  Past Medical History:  Diagnosis Date  . Hypertension     PSH:  Past Surgical History:  Procedure Laterality Date  . NECK SURGERY      Social History:  Social History   Socioeconomic History  . Marital status: Single    Spouse name: Not on file  . Number of children: Not on file  . Years of education: Not on file  . Highest education level: Not on file  Occupational History  . Not on file  Tobacco Use  . Smoking status: Former Games developer  . Smokeless tobacco: Never Used  . Tobacco comment: Patient smoked in his early 46s.  Substance and Sexual Activity  .  Alcohol use: Not Currently  . Drug use: Never  . Sexual activity: Not on file  Other Topics Concern  . Not on file  Social History Narrative  . Not on file   Social Determinants of Health   Financial Resource Strain: Not on file  Food Insecurity: Not on file  Transportation Needs: Not on file  Physical Activity: Not on file  Stress: Not on file  Social Connections: Not on file  Intimate Partner Violence: Not on file    Family History:  Family History  Family history unknown: Yes    Medications:   Current Outpatient Medications on File Prior to Visit  Medication  Sig Dispense Refill  . amLODipine (NORVASC) 10 MG tablet Take 1 tablet (10 mg total) by mouth daily. 90 tablet 1  . atorvastatin (LIPITOR) 40 MG tablet Take 1 tablet (40 mg total) by mouth daily. 90 tablet 1  . hydrochlorothiazide (HYDRODIURIL) 25 MG tablet Take 1 tablet (25 mg total) by mouth daily. 90 tablet 1  . losartan (COZAAR) 50 MG tablet Take 1 tablet (50 mg total) by mouth 2 (two) times daily. 90 tablet 1  . metoprolol tartrate (LOPRESSOR) 25 MG tablet Take 1 tablet (25 mg total) by mouth 2 (two) times daily. 180 tablet 1   No current facility-administered medications on file prior to visit.    Allergies:  No Known Allergies    OBJECTIVE:  Physical Exam  Vitals:   11/12/20 1403  BP: 126/87  Pulse: 88  Weight: 129 lb (58.5 kg)  Height: 5\' 1"  (1.549 m)   Body mass index is 24.37 kg/m. No exam data present  General: well developed, well nourished,  pleasant middle-aged male, seated, in no evident distress Head: head normocephalic and atraumatic.   Neck: supple with no carotid or supraclavicular bruits Cardiovascular: regular rate and rhythm, no murmurs Musculoskeletal: no deformity Skin:  no rash/petichiae Vascular:  Normal pulses all extremities   Neurologic Exam Mental Status: Awake and fully alert.   Primarily speaking - denies speech or language difficulties.  Oriented to place and time. Recent and remote memory intact. Attention span, concentration and fund of knowledge appropriate. Mood and affect appropriate.  Cranial Nerves: Fundoscopic exam reveals sharp disc margins. Pupils equal, briskly reactive to light. Extraocular movements full without nystagmus. Visual fields full to confrontation. Hearing intact. Facial sensation intact. Face, tongue, palate moves normally and symmetrically.  Motor: Normal bulk, tone and strength right upper and lower extremity.  LUE: 4/5 with slighly increased tone and decreased hand dexterity; LLE: 4+/5 Sensory.: decresaed  light touch sensation left side compared to right side Coordination: Rapid alternating movements normal in all extremities except slightly decreased left hand. Finger-to-nose and heel-to-shin performed accurately on right side. Gait and Station: Arises from chair without difficulty. Stance is normal. Gait demonstrates normal stride length and balance without use of assistive device. Tandem walk and heel toe without difficulty.  Romberg negative Reflexes: 2+ LUE and LLE; 1+ RUE and RLE. Toes downgoing.     NIHSS  1 Modified Rankin  2-3      ASSESSMENT: Mark Bass is a 60 y.o. year old male presented with dizziness, difficulty standing, left facial droop and headache on 10/10/2020 with stroke work-up revealing right BG ICH likely hypertensive etiology.  Vascular risk factors include HTN with medication noncompliance, HLD, severe arthrosclerotic disease, old hypertensive hemorrhages on imaging and prior tobacco use.      PLAN:  1. R BG ICH :  a. Residual deficit:  left hemiparesis and sensory impairment. Recently started working with PT - order placed to be evaluated by OT.  Currently working with PCP applying for short term disability.  b. Continue  atorvastatin  for secondary stroke prevention.   c. Repeat CT head to assess for resolution of prior ICH d. Discussed secondary stroke prevention measures and importance of close PCP follow up for aggressive stroke risk factor management  2. HTN: BP goal <130/90.  Stable on current regimen per PCP -discussed importance of medication compliance and routine monitoring at home 3. HLD: LDL goal <70. Recent LDL 110 - on atorvastatin 40 mg daily -request follow-up with PCP in the next 1 to 2 months for repeat lipid panel and prescribing of atorvastatin    Follow up in 3 months or call earlier if needed   CC:  [facility] provider: Dr. Pearlean Brownie PCP: Grayce Sessions, NP    I spent 45 minutes of face-to-face and non-face-to-face time with patient and son  assisted by interpreter.  This included previsit chart review including hospitalization pertinent progress notes, lab work and imaging, lab review, study review, order entry, electronic health record documentation, patient education regarding recent stroke and etiology, residual deficits, importance of managing stroke risk factors and answered all other questions to patient and sons satisfaction Intermittent Ihor Austin, AGNP-BC  [facility] [address] [place], [place] [address]  Phone [phone] Fax [phone] Note: This document was prepared with digital dictation and possible smart phrase technology. Any transcriptional errors that result from this process are unintentional.

## 2020-11-12 NOTE — Patient Instructions (Addendum)
We will repeat CT head to look for resolution of bleed  Continue therapies where you will likely see continued improvement  Continue lipitor  for secondary stroke prevention  Continue to follow up with PCP regarding cholesterol and blood pressure management  Maintain strict control of hypertension with blood pressure goal below 130/90 and cholesterol with LDL cholesterol (bad cholesterol) goal below 70 mg/dL.       Followup in the future with me in 3 months or call earlier if needed       Thank you for coming to see [place] at [facility]. I hope we have been able to provide you high quality care today.  You may receive a patient satisfaction survey over the next few weeks. We would appreciate your feedback and comments so that we may continue to improve ourselves and the health of our patients.    ??t qu? do xu?t huy?t no Hemorrhagic Stroke  ??t qu? do xu?t huy?t x?y ra khi m?ch mu trong no b? r r? ho?c n? (v?). Tnh tr?ng ny gy ch?y mu trong ho?c xung quanh no (xu?t huy?t) v d?n ??n ch?t ??t ng?t m no. ??t qu? do xu?t huy?t no l m?t tr??ng h?p c?p c?u. N c th? gy t?n th??ng no v t? vong. C hai lo?i ??t qu? chnh do xu?t huy?t no:  Xu?t huy?t trong no. ?i?u ny x?y ra n?u ch?y mu xu?t hi?n trong m no.  Xu?t huy?t d??i mng nh?n. ?i?u ny x?y ra khi ch?y mu xu?t hi?n ? vng gi?a no v mng bao ph? no (khoang d??i mng nh?n). Nguyn nhn g gy ra? Tnh tr?ng ny c th? l do:  T?n th??ng (ch?n th??ng) ??u.  Ph?n thnh m?ch mu b? y?u phnh ho?c ph?ng ra (phnh m?ch no).  Cc m?ch mu c?ng v m?ng do hnh thnh m?ng bm.  Cc m?ch mu b? r?i ? trong no (d? d?ng thng ??ng t?nh m?ch).  Tch t? protein trn thnh ??ng m?ch no (b?nh ??ng m?ch d?ng b?t).  M?ch mu b? vim (vim m?ch).  Kh?i u no. ?i khi khng r nguyn nhn gy ra tnh tr?ng ny. ?i?u g lm t?ng nguy c?? Nh?ng y?u t? sau c th? khi?n qu v? d? b? tnh  tr?ng ny h?n:  T?ng huy?t p.  C m?ch mu b?t th??ng t? lc sinh ra (b?t th??ng b?m sinh).  B?nh ch?y mu, ch?ng h?n nh? b?nh mu kh ?ng, b?nh h?ng c?u hnh li?m ho?c b?nh gan.  Thu?c lm long mu (thu?c ch?ng ?ng mu) lm cho mu qu long.  L ng??i cao tu?i.  U?ng r??u m?c ?? v?a ph?i ho?c nhi?u.  S? d?ng ma ty, ch?ng h?n nh? cocaine ho?c methamphetamines. Cc d?u hi?u ho?c tri?u ch?ng l g? Nh?ng tri?u ch?ng c?a tnh tr?ng ny bao g?m:  Kh?i pht ??t ng?t: ? Y?u ho?c t ? m?t, cnh tay, ho?c chn, ??c bi?t l ? m?t bn c? th?. ? L l?n. ? Kh ni ho?c kh hi?u l?i ni. ? Kh nhn ? m?t ho?c c? hai bn m?t. ? Kh ?i b? ho?c kh c? ??ng tay ho?c chn. ? Chng m?t, ho?c m?t th?ng b?ng ho?c m?t kh? n?ng ph?i h?p. ? Bu?n nn v nn. ? ?au ??u d? d?i khng r nguyn nhn. C?n ?au ??u ny c th? c?m th?y nh? l c?n ?au t?i t? nh?t m m?t ng??i t?ng b?.  Co gi?t. Ch?n ?on tnh tr?ng ny nh? th? no?  Tnh tr?ng ny c th? ???c ch?n ?on d?a vo:  Cc tri?u ch?ng, b?nh s? v khm th?c th? c?a qu v?.  Cc ki?m tra, bao g?m: ? Xt nghi?m mu. ? Ch?p CT (ch?p c?t l?p). ? Ch?p MRI (ch?p c?ng h??ng t?). ? Ch?p CTA (ch?p c?t l?p m?ch mu) ho?c ch?p MRA (ch?p c?ng h??ng t? m?ch mu).  Ch?p m?ch c s? d?ng ?ng thng. Trong th? thu?t ny, thu?c nhu?m ???c b?m qua m?t ?ng di, nh? (?ng thng) vo m?t trong cc ??ng m?ch c?a qu v?. Ch?p X-quang ???c th?c hi?n v s? cho th?y c ch? b? t?c ho?c c v?n ?? g ? m?t m?ch mu hay khng. Tnh tr?ng ny ???c ?i?u tr? nh? th? no? M?c tiu ?i?u tr? l lm d?ng ch?y mu, gi?m p l?c trong no, gi?m cc tri?u ch?ng v ng?n ng?a cc bi?n ch?ng. ?i?u tr? c th? bao g?m:  Cc lo?i thu?c tc d?ng nh? sau: ? Gi?m huy?t p (ch?ng t?ng huy?t p). ? Gi?m ?au (thu?c gi?m ?au), s?t, bu?n nn ho?c nn. ? D?ng ho?c ng?n ng?a co gi?t (thu?c ch?ng co gi?t). ? Ng?n khng cho m?ch mu trong no co th?t khi ?p ?ng v?i ch?y mu. ? Ki?m sot ch?y mu  trong no.  S? d?ng my gip qu v? th? (my th?).  Truy?n mu ?? gip mu ?ng.  ??t m?t ?ng (shunt) trong no ?? lm gi?m p l?c.  Li?u php v?t l, ngn ng? ho?c ngh? nghi?p.  Ph?u thu?t ?? c?m mu, lo?i b? c?c mu ?ng ho?c kh?i u ho?c lm gi?m p l?c. Vi?c ?i?u tr? ty thu?c vo nguyn nhn, m?c ?? n?ng v th?i gian x?y ra cc tri?u ch?ng. Hy ni chuy?n v?i chuyn gia ch?m [place] s?c kh?e v? nh?ng k? v?ng g trong qu trnh ph?c h?i. Tun th? nh?ng h??ng d?n ny ? nh: Ho?t ??ng  S? d?ng khung t?p ?i ho?c g?y ch?ng theo ch? d?n c?a chuyn gia ch?m [place] s?c kh?e.  Tr? l?i sinh ho?t bnh th??ng theo ch? d?n c?a chuyn gia ch?m [place] s?c kh?e. Hy h?i chuyn gia ch?m [place] s?c kh?e v? cc ho?t ??ng no l an ton cho qu v?.  Ngh? ng?i ?? gip no lnh l?i. ??m b?o qu v?: ? Ng? th?t nhi?u. Trnh th?c [place]. ? Duy trnh l?ch trnh ng?. ?i ng? v th?c d?y vo cng m?t th?i ?i?m m?i ngy. ? Trnh cc ho?t ??ng gy c?ng th?ng v? th? ch?t ho?c tinh th?n. L?i s?ng  Khng u?ng r??u n?u: ? Chuyn gia ch?m [place] s?c kh?e khuyn qu v? khng u?ng r??u. ? Qu v? c [place], c th? c [place], ho?c c k? ho?ch c [place].  N?u qu v? u?ng r??u: ? Gi?i h?n l??ng r??u qu v? u?ng ? m?c:  0-1 ly/ngy ??i v?i n? gi?i.  0-2 ly/ngy ??i v?i nam gi?i. ? Bi?t m?t ly c bao nhiu r??u. ? M?, m?t ly t??ng ???ng v?i m?t chai bia 12 ao x? (355 mL), m?t ly r??u vang 5 ao x? (148 mL), ho?c m?t ly r??u m?nh 1 ao x? (44 mL). H??ng d?n chung  Khng s? d?ng b?t k? s?n ph?m no c nicotine ho?c thu?c l. Nh?ng s?n ph?m ny bao g?m thu?c l d?ng ht, thu?c l d?ng nhai v d?ng c? ht thu?c, ch?ng h?n nh? thu?c l ?i?n t?. N?u qu v? c?n gip ?? ?? cai thu?c, hy h?i chuyn gia ch?m [place] s?c kh?e.  Khng li xe ho?c v?n hnh my mc h?ng n?ng cho ??n khi chuyn gia ch?m [place] s?c kh?e c?a qu v? ch?p thu?n.  Ch? s? d?ng thu?c khng k ??n v thu?c k ??n theo ch? d?n c?a chuyn gia ch?m [place] s?c kh?e.  Tun th? t?t c? cc  l?n khm theo di, bao g?m c? cc l?n khm v?i bc s? tr? li?u. ?i?u ny c vai tr quan tr?ng. Ng?n ng?a tnh tr?ng ny nh? th? no? Qu v? c th? gi?m nguy c? ??t qu? b?ng cch qu?n l cc tnh tr?ng, ch?ng h?n nh?:  Huy?t p cao.  Cholesterol cao.  Ti?u ???ng.  B?nh tim.  Bo ph. Cc y?u t? khc v thay ??i l?i s?ng c th? lm gi?m nguy c? c?a qu v? bao g?m:  B? ht thu?c, h?n ch? u?ng r??u v duy tr ho?t ??ng th? ch?t.  Theo di b?ng xt nghi?m mu th??ng xuyn n?u qu v? dng thu?c ch?ng ?ng mu (thu?c lm long mu). Hy lin l?c v?i chuyn gia ch?m [place] s?c kh?e n?u: Qu v? c b?t k? tri?u ch?ng no sau ?y:  ?au ??u th??ng xuyn ti pht (m?n tnh).  Bu?n nn.  Cc v?n ?? v? th? l?c.  T?ng nha?y ca?m v??i ti?ng ??ng ho??c a?nh sa?ng.  Tr?m c?m, thay ??i tm tr?ng, lo u ho?c cu k?nh.  Cc v?n ?? v? tr nh?Bjorn Pippin? t?p trung ho??c kho? chu? y?.  Nh?ng v?n ?? v? ng?.  Lun ca?m th?y m?t. Yu c?u tr? gip ngay l?p t?c n?u:  Qu v? b? m?t  th?c m?t ph?n ho?c hon ton.  Qu v? ?ang dng thu?c lm long mu v qu v? b? ng ho?c b? th??ng nh? ? ??u.  Qu v? b? r?i lo?n ch?y mu v qu v? ng ho?c qu v? b? ch?n th??ng nh? ? ??u.  Qu v? c b?t k? tri?u ch?ng ??t qu? no. "BE FAST" l cch d? dng ?? ghi nh? cc d?u hi?u chnh c?nh bo ??t qu?: ? B - Th?ng b?ng (Balance). Cc d?u hi?u l chng m?t, ??t ng?t ?i l?i kh kh?n ho?c m?t th?ng b?ng. ? E - M?t (Eyes). Cc d?u hi?u ny bao g?m nhn m? ho?c ??t ng?t thay ??i th? l?c. ? F - M?t (Face). Cc d?u hi?u l ??t ng?t y?u ho?c t ? m?t, ho?c m?t ho?c mi m?t x? xu?ng ? m?t bn. ? A - Cnh tay (Arms). Cc d?u hi?u l y?u ho?c t ? m?t cnh tay. ?i?u ny x?y ra ??t ng?t v th??ng ? m?t bn c? th?. ? S - L?i ni (Speech). Cc d?u hi?u l ??t ng?t kh ni, ni ng?ng ho?c kh hi?u ng??i khc ni g. ? T - Th?i gian (Time). Th?i gian c?n g?i d?ch v? c?p c?u. Ghi l?i th?i gian cc tri?u ch?ng b?t ??u.  Qu v? c  cc d?u hi?u khc c?a ??t qu?, ch?ng h?n nh?: ? ?au ??u d? d?i, ??t ng?t khng r nguyn nhn. ? Bu?n nn ho?c nn. ? Co gi?t. Nh?ng tri?u ch?ng ny c th? l bi?u hi?n c?a m?t v?n ?? nghim tr?ng c?n c?p c?u. Khng ch? xem tri?u ch?ng c h?t khng. Hy ?i khm ngay l?p t?c. G?i cho d?ch v? c?p c?u t?i ??a ph??ng (911 ? [place] Khng t? li xe ??n b?nh vi?n. Tm t?t  ??t qu? do xu?t huy?t do ch?y mu ? trong ho?c xung quanh no gy ra.  ??  t qu? do xu?t huy?t no l m?t tr??ng h?p c?p c?u.  Bi?t r cc d?u hi?u v tri?u ch?ng c?a ??t qu?Ladell Heads v? c th? gi?m nguy c? ??t qu? b?ng cch qu?n l cc tnh tr?ng, b? ht thu?c v th?c hi?n cc thay ??i l?i s?ng khc. Thng tin ny khng nh?m m?c ?ch thay th? cho l?i khuyn m chuyn gia ch?m [place] s?c kh?e ni v?i qu v?. Hy b?o ??m qu v? ph?i th?o lu?n b?t k? v?n ?? g m qu v? c v?i chuyn gia ch?m [place] s?c kh?e c?a qu v?. Document Revised: 05/21/2020 Document Reviewed: 05/21/2020 Elsevier Patient Education  2021 [employer].

## 2020-11-15 NOTE — Progress Notes (Signed)
I agree with the above plan 

## 2020-11-16 ENCOUNTER — Telehealth: Payer: Self-pay | Admitting: Adult Health

## 2020-11-16 NOTE — Telephone Encounter (Signed)
[employer] Berkley Harvey: [id number] (exp. 11/16/20 to 12/31/20) order sent to GI. They will reach out to the patient to schedule.

## 2020-11-18 ENCOUNTER — Ambulatory Visit (INDEPENDENT_AMBULATORY_CARE_PROVIDER_SITE_OTHER): Payer: 59 | Admitting: Primary Care

## 2020-11-24 ENCOUNTER — Inpatient Hospital Stay: Admission: RE | Admit: 2020-11-24 | Payer: 59 | Source: Ambulatory Visit

## 2020-12-24 ENCOUNTER — Ambulatory Visit (INDEPENDENT_AMBULATORY_CARE_PROVIDER_SITE_OTHER): Payer: 59 | Admitting: Primary Care

## 2020-12-24 ENCOUNTER — Telehealth (INDEPENDENT_AMBULATORY_CARE_PROVIDER_SITE_OTHER): Payer: Self-pay | Admitting: Primary Care

## 2020-12-24 NOTE — Telephone Encounter (Signed)
Patient came into the clinic today thinking that he had an appointment for today for leave of absence paperwork. I advised patient that his appointment was cancelled and he stated he needed the paperwork still completed. Rescheduled an appointment and placed paperwork in the folder up front (where patient paperwork for pickup goes) under his last name.   FYI.

## 2021-01-05 ENCOUNTER — Telehealth: Payer: Self-pay

## 2021-01-05 NOTE — Therapy (Signed)
OUTPATIENT PHYSICAL THERAPY TREATMENT/RE-CERTIFICATION NOTE   Patient Name: Mark Bass MRN: [id number] DOB:1961/06/04, 60 y.o., male Today's Date: 01/06/2021  PCP: Grayce Sessions, NP REFERRING PROVIDER: Ihor Austin, NP   PT End of Session - 01/06/21 1449    Visit Number 2    Number of Visits 10    Date for PT Re-Evaluation 03/03/21    Authorization Type [employer] (30 VL between PT/OT)    Progress Note Due on Visit --    PT Start Time 1447    PT Stop Time 1530    PT Time Calculation (min) 43 min    Equipment Utilized During Treatment Gait belt    Activity Tolerance Patient tolerated treatment well    Behavior During Therapy WFL for tasks assessed/performed           Past Medical History:  Diagnosis Date  . Hypertension    Past Surgical History:  Procedure Laterality Date  . NECK SURGERY     Patient Active Problem List   Diagnosis Date Noted  . Stroke, 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HCC) 10/10/2020  . COVID-19 virus infection 01/02/2019  . Pneumonia due to COVID-19 virus 01/01/2019  . Elevated blood pressure reading without diagnosis of hypertension 01/01/2019  . Dehydration 01/01/2019  . Sepsis (HCC) 01/01/2019    REFERRING DIAG: I61.9 (ICD-10-CM) - Stroke, 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HCC)  THERAPY DIAG:  Other abnormalities of gait and mobility  Muscle weakness (generalized)  PERTINENT HISTORY: HTN, Hemorrhagic stroke in the right basal ganglia (10/2020)  PRECAUTIONS: None  SUBJECTIVE: Patient reports that he continues to have heaviness/numbness down the left side of his body. Patient reports no significant changes. No falls. Patient reports he would only like to add 3 additional visits.    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 Interpreter Name/ID: Jamelle Haring (Language Resources)  PAIN:  Are you having pain? No Denies Pain, but reports continued numbness.    OBJECTIVE/TREATMENT:   Due to extended absence from PT services, completed reassessment during today's session of the following:    Patient ambulating with walking stick into session, then without device during session. Completed ambulation in therapy gym without AD x 100 ft, mild buckling noted in LLE that was not noted with use of AD. CGA without use of AD.   5x sit <> stand: 16.75 secs with light UE support on BLE  10 Meter walk test: 12.44 secs = 0.80 m/s. Fatigue reported 10/10 after completion   [facility] Adult PT Treatment/Exercise - 01/06/21 0001      Standardized Balance Assessment   Standardized Balance Assessment Berg Balance Test      Berg Balance Test   Sit to Stand Able to stand without using hands and stabilize independently    Standing Unsupported Able to stand 2 minutes with supervision   limitd by fatigue; not balance   Sitting with Back Unsupported but Feet Supported on Floor or Stool Able to sit safely and securely 2 minutes    Stand to Sit Sits safely with minimal use of hands    Transfers Able to transfer safely, minor use of hands    Standing Unsupported with Eyes Closed Able to stand 10 seconds safely    Standing Ubsupported with Feet Together Able to place feet together independently and stand 1 minute safely    From Standing, Reach Forward with Outstretched Arm Can reach confidently >25 cm (10")    From Standing Position, Pick up Object from Floor Able to pick up shoe safely and easily    From Standing Position, Turn to Look  Behind Over each Shoulder Looks behind from both sides and weight shifts well    Turn 360 Degrees Able to turn 360 degrees safely in 4 seconds or less    Standing Unsupported, Alternately Place Feet on Step/Stool Able to stand independently and safely and complete 8 steps in 20 seconds    Standing Unsupported, One Foot in Front Able to place foot tandem independently and hold 30 seconds    Standing on One Leg Able to lift leg independently and hold > 10 seconds    Total Score 55           Reviewed HEP and progressed to patient's tolerance. Completed all of the  following exercises during today's session, intermittent rest breaks required due to fatigue. Handout provided and reviewed with patient:  Sit to Stand with Arms Crossed - 2 x daily - 7 x weekly - 2 sets - 10 reps  Heel Toe Raises with Counter Support - 1 x daily - 7 x weekly - 2 sets - 10 reps  Standing Hip Extension with Counter Support - 1 x daily - 7 x weekly - 2 sets - 10 reps  Standing Hip Abduction with Counter Support - 1 x daily - 7 x weekly - 2 sets - 10 reps   PATIENT EDUCATION: Education details: Occupational Therapy Referral and Benefits of OT Eval;  Berg Balance Score; Updated HEP Person educated: Patient Education method: Explanation and Demonstration Education comprehension: verbalized understanding, verbal cues required and needs further education   HOME EXERCISE PROGRAM: Access Code: [email or web address] URL: [email or web address] Date: 01/06/2021 Prepared by: Jethro Bastos  Exercises Sit to Stand with Arms Crossed - 2 x daily - 7 x weekly - 2 sets - 10 reps Heel Toe Raises with Counter Support - 1 x daily - 7 x weekly - 2 sets - 10 reps Standing Hip Extension with Counter Support - 1 x daily - 7 x weekly - 2 sets - 10 reps Standing Hip Abduction with Counter Support - 1 x daily - 7 x weekly - 2 sets - 10 reps    ASSESSMENT:  CLINICAL IMPRESSION: Patient returns to PT services after extended absence for unknown reason. Completed reassessment today during session. Patient is currently ambulating without AD at 0.80 m/s but demonstrate abnormal gait pattern and intermittent buckling of LLE. Patient continues to present with impaired sensation, decreased strength, decreased endurance, and abnormal gait. Patient demonstrating good standing balance and low fall risk with score of 55/56 today during session. Patient continues to experience significant fatigue limiting daily activities and ambulation distance. Rest of session spent reviewing and updating HEP to  patient's tolerance. Patient will benefit from continued skilled PT services to address impairments and maximize activity tolerance.   REHAB POTENTIAL: Good  CLINICAL DECISION MAKING: Stable/uncomplicated  EVALUATION COMPLEXITY: Low   GOALS: Goals reviewed with patient? Yes  SHORT TERM GOALS:  STG Name Target Date Goal status  1 Patient will undergo further assessment of endurance with 2MWT/6MWT and LTG to be set as applicable Baseline: TBA 02/03/21 INITIAL    LONG TERM GOALS:  LTG Name Target Date Goal status  1 Patient will improve 5x sit <> stand to </= 12 seconds without UE support to demo improved balance and functional strength Comments:  03/03/21 INITIAL  2 Pt will demo gait speed increase by 0.10 m/s to improve functional ambulation. Comments:  03/03/21 INITIAL  3 LTG to be set for 2MWT/6MWT as appropriate Comments: TBA 03/03/21 INITIAL  4 Pt will be  independent with balance/strength HEP  Comments: HEP established 03/03/21 INITIAL   PLAN: PT FREQUENCY: 1x/week  PT DURATION: 8 weeks  PLANNED INTERVENTIONS: Therapeutic exercises, Therapeutic activity, Neuro Muscular re-education, Balance training, Gait training, Patient/Family education, Joint mobilization, Stair training, Orthotic/Fit training, Electrical stimulation, Cryotherapy and Moist heat  PLAN FOR NEXT SESSION: Assess 2MWT/6MWT. Review HEP additions. Continue LLE functional strengthening. Continue gait training without AD. We have OT Referral, follow up if interested in getting evaluation scheduled.    Tempie Donning, PT, DPT 01/06/2021, 5:53 PM    [facility] [address] [place], [place], [address] Phone: [phone]   Fax:  [phone]  Patient name: Mark Bass MRN: [id number] DOB: 1960/11/14

## 2021-01-05 NOTE — Telephone Encounter (Signed)
Copied from CRM [id number]. Topic: Medical Record Request - Other >> Jan 01, 2021 11:11 AM Pawlus, Maxine Glenn A wrote: Requestor Name/Agency: [employer]  Call Back #: [phone] Information Requested: All medical records from March 2022 to present.   Please fax to [phone] >> Jan 01, 2021 11:14 AM Pawlus, Gifford Shave wrote: Pt needs this sent for his Disability request.   Patient came into the office on Friday 5-27 to request medical records to be faxed. Medical records were printed and faxed to [facility] and a copy was given to the patient as well.

## 2021-01-06 ENCOUNTER — Ambulatory Visit: Payer: 59 | Attending: Internal Medicine

## 2021-01-06 ENCOUNTER — Other Ambulatory Visit: Payer: Self-pay

## 2021-01-06 DIAGNOSIS — M6281 Muscle weakness (generalized): Secondary | ICD-10-CM | POA: Diagnosis present

## 2021-01-06 DIAGNOSIS — R2689 Other abnormalities of gait and mobility: Secondary | ICD-10-CM | POA: Diagnosis not present

## 2021-01-06 DIAGNOSIS — R2681 Unsteadiness on feet: Secondary | ICD-10-CM | POA: Diagnosis present

## 2021-01-13 ENCOUNTER — Ambulatory Visit: Payer: 59

## 2021-01-13 ENCOUNTER — Other Ambulatory Visit: Payer: Self-pay

## 2021-01-13 DIAGNOSIS — M6281 Muscle weakness (generalized): Secondary | ICD-10-CM

## 2021-01-13 DIAGNOSIS — R2689 Other abnormalities of gait and mobility: Secondary | ICD-10-CM

## 2021-01-13 DIAGNOSIS — R2681 Unsteadiness on feet: Secondary | ICD-10-CM

## 2021-01-13 NOTE — Therapy (Signed)
OUTPATIENT PHYSICAL THERAPY TREATMENT/RE-CERTIFICATION NOTE   Patient Name: Mark Bass MRN: [id number] DOB:July 13, 1961, 60 y.o., male Today's Date: 01/13/2021  PCP: Grayce Sessions, NP REFERRING PROVIDER: Ihor Austin, NP    Past Medical History:  Diagnosis Date   Hypertension    Past Surgical History:  Procedure Laterality Date   NECK SURGERY     Patient Active Problem List   Diagnosis Date Noted   Stroke, 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HCC) 10/10/2020   COVID-19 virus infection 01/02/2019   Pneumonia due to COVID-19 virus 01/01/2019   Elevated blood pressure reading without diagnosis of hypertension 01/01/2019   Dehydration 01/01/2019   Sepsis (HCC) 01/01/2019    REFERRING DIAG: I61.9 (ICD-10-CM) - Stroke, 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HCC)  THERAPY DIAG:  Unsteadiness on feet  Other abnormalities of gait and mobility  Muscle weakness (generalized)  PERTINENT HISTORY: HTN, Hemorrhagic stroke in the right basal ganglia (10/2020)  PRECAUTIONS: None  SUBJECTIVE: Patient reports that he continues to have heaviness/numbness down the left side of his body. 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 Interpreter Name/ID: Jamelle Haring (Language Resources).  Patient concerned about insurance coverage since injury as he has been out of work for 2 months  PAIN:  Are you having pain? No Denies Pain, but reports continued numbness.    OBJECTIVE/TREATMENT:   01/13/21 Performed 6 MWT  10x STS arms crossed  Heel/toe raise x10  Standing marching x10 alt.  Standing hip ext x10 B  Standing hip ABD x10 B  Tandem stand EO/EC 30s hold ea. Position   01/06/21 Due to extended absence from PT services, completed reassessment during today's session of the following:  Patient ambulating with walking stick into session, then without device during session. Completed ambulation in therapy gym without AD x 100 ft, mild buckling noted in LLE that was not noted with use of AD. CGA without use of AD.  5x sit <> stand: 16.75 secs  with light UE support on BLE 10 Meter walk test: 12.44 secs = 0.80 m/s. Fatigue reported 10/10 after completion    Reviewed HEP and progressed to patient's tolerance. Completed all of the following exercises during today's session, intermittent rest breaks required due to fatigue. Handout provided and reviewed with patient: Sit to Stand with Arms Crossed - 2 x daily - 7 x weekly - 2 sets - 10 reps Heel Toe Raises with Counter Support - 1 x daily - 7 x weekly - 2 sets - 10 reps Standing Hip Extension with Counter Support - 1 x daily - 7 x weekly - 2 sets - 10 reps Standing Hip Abduction with Counter Support - 1 x daily - 7 x weekly - 2 sets - 10 reps   PATIENT EDUCATION: Education details: Occupational Therapy Referral and Benefits of OT Eval;  Berg Balance Score; Updated HEP Person educated: Patient Education method: Explanation and Demonstration Education comprehension: verbalized understanding, verbal cues required and needs further education     HOME EXERCISE PROGRAM: Access Code: [email or web address] URL: [email or web address] Date: 01/06/2021 Prepared by: Jethro Bastos  Exercises Sit to Stand with Arms Crossed - 2 x daily - 7 x weekly - 2 sets - 10 reps Heel Toe Raises with Counter Support - 1 x daily - 7 x weekly - 2 sets - 10 reps Standing Hip Extension with Counter Support - 1 x daily - 7 x weekly - 2 sets - 10 reps Standing Hip Abduction with Counter Support - 1 x daily - 7 x weekly - 2 sets - 10 reps      ASSESSMENT:  CLINICAL IMPRESSION: HEP reviewed, added balance tasks in clinic, performed 6 MWT, patient able to ambulate in clinic today w/o AD, balance challenged with tandem stance and vision removed tasks. Multiple questions answered regarding LLE symptoms and etiology of injury.   REHAB POTENTIAL: Good   CLINICAL DECISION MAKING: Stable/uncomplicated   EVALUATION COMPLEXITY: Low     GOALS: Goals reviewed with patient? Yes   SHORT TERM GOALS:  STG  Name Target Date Goal status  1 Patient will undergo further assessment of endurance with 2MWT/6MWT and LTG to be set as applicable Baseline: TBA 02/03/21 INITIAL     LONG TERM GOALS:   LTG Name Target Date Goal status  1 Patient will improve 5x sit <> stand to </= 12 seconds without UE support to demo improved balance and functional strength Comments:  03/03/21 INITIAL  2 Pt will demo gait speed increase by 0.10 m/s to improve functional ambulation. Comments:  03/03/21 INITIAL  3 LTG to be set for 2MWT/6MWT as appropriate Comments: TBA 03/03/21 INITIAL  4 Pt will be independent with balance/strength HEP  Comments: HEP established 03/03/21 INITIAL    PLAN: PT FREQUENCY: 1x/week   PT DURATION: 8 weeks   PLANNED INTERVENTIONS: Therapeutic exercises, Therapeutic activity, Neuro Muscular re-education, Balance training, Gait training, Patient/Family education, Joint mobilization, Stair training, Orthotic/Fit training, Electrical stimulation, Cryotherapy and Moist heat   PLAN FOR NEXT SESSION: Continue LLE functional strengthening. Continue gait training without AD. We have OT Referral, follow up if interested in getting evaluation scheduled.    Hildred Laser, PT 01/13/2021, 4:18 PM    [facility] [address] [place], [place], [address] Phone: [phone]   Fax:  [phone]  Patient name: Mark Bass MRN: [id number] DOB: 11-Dec-1960

## 2021-01-18 ENCOUNTER — Ambulatory Visit: Payer: 59

## 2021-01-18 ENCOUNTER — Other Ambulatory Visit: Payer: Self-pay

## 2021-01-18 DIAGNOSIS — M6281 Muscle weakness (generalized): Secondary | ICD-10-CM

## 2021-01-18 DIAGNOSIS — R2689 Other abnormalities of gait and mobility: Secondary | ICD-10-CM | POA: Diagnosis not present

## 2021-01-18 DIAGNOSIS — R2681 Unsteadiness on feet: Secondary | ICD-10-CM

## 2021-01-18 NOTE — Therapy (Signed)
OUTPATIENT PHYSICAL THERAPY TREATMENT/RE-CERTIFICATION NOTE   Patient Name: Mark Bass MRN: [id number] DOB:Jul 20, 1961, 60 y.o., male Today's Date: 01/18/2021  PCP: Grayce Sessions, NP REFERRING PROVIDER: Ihor Austin, NP    Past Medical History:  Diagnosis Date   Hypertension    Past Surgical History:  Procedure Laterality Date   NECK SURGERY     Patient Active Problem List   Diagnosis Date Noted   Stroke, 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HCC) 10/10/2020   COVID-19 virus infection 01/02/2019   Pneumonia due to COVID-19 virus 01/01/2019   Elevated blood pressure reading without diagnosis of hypertension 01/01/2019   Dehydration 01/01/2019   Sepsis (HCC) 01/01/2019    REFERRING DIAG: I61.9 (ICD-10-CM) - Stroke, 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HCC)  THERAPY DIAG:  No diagnosis found.  PERTINENT HISTORY: HTN, Hemorrhagic stroke in the right basal ganglia (10/2020)  PRECAUTIONS: None  SUBJECTIVE: No change since last session.  Has brought RW today which is his perferred AD. 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 Interpreter Name/ID: Gracelyn Nurse (Language Resources).  Continues to report L side numbness and weakness.  PAIN:  Are you having pain? No Denies Pain, but reports continued numbness.    OBJECTIVE/TREATMENT:    01/18/21   Supine bridging 2x10  B hip fallouts 2x10  Heel slides, alt., 2x10  Supine march alt. 2x10  10x STS on [employer]  Standing with foam roll for support, Heel raises, toe raises, abd and marching 2x10, alt. where appropriate  Scifit L1 6 min. Arms 6    01/13/21 Performed 6 MWT  10x STS arms crossed  Heel/toe raise x10  Standing marching x10 alt.  Standing hip ext x10 B  Standing hip ABD x10 B  Tandem stand EO/EC 30s hold ea. Position   01/06/21 Due to extended absence from PT services, completed reassessment during today's session of the following:  Patient ambulating with walking stick into session, then without device during session. Completed ambulation in therapy gym without  AD x 100 ft, mild buckling noted in LLE that was not noted with use of AD. CGA without use of AD.  5x sit <> stand: 16.75 secs with light UE support on BLE 10 Meter walk test: 12.44 secs = 0.80 m/s. Fatigue reported 10/10 after completion    Reviewed HEP and progressed to patient's tolerance. Completed all of the following exercises during today's session, intermittent rest breaks required due to fatigue. Handout provided and reviewed with patient: Sit to Stand with Arms Crossed - 2 x daily - 7 x weekly - 2 sets - 10 reps Heel Toe Raises with Counter Support - 1 x daily - 7 x weekly - 2 sets - 10 reps Standing Hip Extension with Counter Support - 1 x daily - 7 x weekly - 2 sets - 10 reps Standing Hip Abduction with Counter Support - 1 x daily - 7 x weekly - 2 sets - 10 reps   PATIENT EDUCATION: Education details: Occupational Therapy Referral and Benefits of OT Eval;  Berg Balance Score; Updated HEP Person educated: Patient Education method: Explanation and Demonstration Education comprehension: verbalized understanding, verbal cues required and needs further education     HOME EXERCISE PROGRAM: Access Code: [email or web address] URL: [email or web address] Date: 01/06/2021 Prepared by: Jethro Bastos  Exercises Sit to Stand with Arms Crossed - 2 x daily - 7 x weekly - 2 sets - 10 reps Heel Toe Raises with Counter Support - 1 x daily - 7 x weekly - 2 sets - 10 reps Standing Hip Extension with Counter Support - 1 x daily -  7 x weekly - 2 sets - 10 reps Standing Hip Abduction with Counter Support - 1 x daily - 7 x weekly - 2 sets - 10 reps      ASSESSMENT:   CLINICAL IMPRESSION:   Patient arrives in clinic using RW which is his preferred AD.  Todays session focused on strength and coordination building in trunk and LEs, balance tasks in standing and aerobic work on [employer] POTENTIAL: Good   CLINICAL DECISION MAKING: Stable/uncomplicated   EVALUATION COMPLEXITY: Low      GOALS: Goals reviewed with patient? Yes   SHORT TERM GOALS:  STG Name Target Date Goal status  1 Patient will undergo further assessment of endurance with 2MWT/6MWT and LTG to be set as applicable Baseline: TBA 02/03/21 6 MWT Completed 01/13/21     LONG TERM GOALS:   LTG Name Target Date Goal status  1 Patient will improve 5x sit <> stand to </= 12 seconds without UE support to demo improved balance and functional strength Comments:  03/03/21 INITIAL  2 Pt will demo gait speed increase by 0.10 m/s to improve functional ambulation. Comments:  03/03/21 INITIAL  3 LTG to be set for 2MWT/6MWT as appropriate Comments: TBA 03/03/21 01/13/21 Goal is 1060ft  4 Pt will be independent with balance/strength HEP  Comments: HEP established 03/03/21 INITIAL    PLAN: PT FREQUENCY: 1x/week   PT DURATION: 8 weeks   PLANNED INTERVENTIONS: Therapeutic exercises, Therapeutic activity, Neuro Muscular re-education, Balance training, Gait training, Patient/Family education, Joint mobilization, Stair training, Orthotic/Fit training, Electrical stimulation, Cryotherapy and Moist heat   PLAN FOR NEXT SESSION: Continue LLE functional strengthening. Continue gait training without AD. Balance and core strengthening emphasizing SLS L   Hildred Laser, PT 01/18/2021, 3:37 PM    [facility] [address] [place], [place], [address] Phone: [phone]   Fax:  [phone]  Patient name: Mark Bass MRN: [id number] DOB: 27-Feb-1961   [facility] [address] [place], [place], [address] Phone: [phone]   Fax:  [phone]  Physical Therapy Treatment  Patient Details  Name: Mark Bass MRN: [id number] Date of Birth: 09-16-1960 No data recorded  Encounter Date: 01/18/2021    Past Medical History:  Diagnosis Date   Hypertension     Past Surgical History:  Procedure Laterality  Date   NECK SURGERY      There were no vitals filed for this visit.                                           Patient will benefit from skilled therapeutic intervention in order to improve the following deficits and impairments:     Visit Diagnosis: No diagnosis found.     Problem List Patient Active Problem List   Diagnosis Date Noted   Stroke, 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HCC) 10/10/2020   COVID-19 virus infection 01/02/2019   Pneumonia due to COVID-19 virus 01/01/2019   Elevated blood pressure reading without diagnosis of hypertension 01/01/2019   Dehydration 01/01/2019   Sepsis (HCC) 01/01/2019    Hildred Laser PT 01/18/2021, 3:36 PM  [facility] [address] [place], [place], [address] Phone: [phone]   Fax:  [phone]  Name: Jerret Mcbane MRN: [id number] Date of Birth: 1960-09-09

## 2021-01-19 ENCOUNTER — Ambulatory Visit (INDEPENDENT_AMBULATORY_CARE_PROVIDER_SITE_OTHER): Payer: 59 | Admitting: Primary Care

## 2021-01-19 VITALS — BP 190/100 | HR 97 | Resp 16 | Wt 127.0 lb

## 2021-01-19 DIAGNOSIS — I619 Nontraumatic intracerebral hemorrhage, unspecified: Secondary | ICD-10-CM | POA: Diagnosis not present

## 2021-01-19 DIAGNOSIS — Z760 Encounter for issue of repeat prescription: Secondary | ICD-10-CM

## 2021-01-19 DIAGNOSIS — I10 Essential (primary) hypertension: Secondary | ICD-10-CM | POA: Diagnosis not present

## 2021-01-19 DIAGNOSIS — Z1211 Encounter for screening for malignant neoplasm of colon: Secondary | ICD-10-CM

## 2021-01-19 MED ORDER — LOSARTAN POTASSIUM 50 MG PO TABS: 50.0000 mg | ORAL_TABLET | Freq: Two times a day (BID) | ORAL | 1 refills | Status: DC

## 2021-01-19 MED ORDER — AMLODIPINE BESYLATE 10 MG PO TABS: 10.0000 mg | ORAL_TABLET | Freq: Every day | ORAL | 1 refills | Status: DC

## 2021-01-19 MED ORDER — ATORVASTATIN CALCIUM 40 MG PO TABS: 40.0000 mg | ORAL_TABLET | Freq: Every day | ORAL | 1 refills | Status: DC

## 2021-01-19 MED ORDER — HYDROCHLOROTHIAZIDE 25 MG PO TABS
25.0000 mg | ORAL_TABLET | Freq: Every day | ORAL | 1 refills | Status: DC
Start: 2021-01-19 — End: 2021-06-22

## 2021-01-19 MED ORDER — METOPROLOL TARTRATE 25 MG PO TABS
25.0000 mg | ORAL_TABLET | Freq: Two times a day (BID) | ORAL | 1 refills | Status: DC
Start: 2021-01-19 — End: 2021-06-22

## 2021-01-19 MED ORDER — AMLODIPINE BESYLATE 10 MG PO TABS
10.0000 mg | ORAL_TABLET | Freq: Every day | ORAL | 1 refills | Status: DC
Start: 2021-01-19 — End: 2021-06-22

## 2021-01-19 MED ORDER — CLONIDINE HCL 0.1 MG PO TABS
0.2000 mg | ORAL_TABLET | Freq: Every day | ORAL | Status: DC
  Administered 2021-01-19: 11:00:00 0.2 mg via ORAL

## 2021-01-19 MED ORDER — METOPROLOL TARTRATE 25 MG PO TABS: 25.0000 mg | ORAL_TABLET | Freq: Two times a day (BID) | ORAL | 1 refills | Status: DC

## 2021-01-19 MED ORDER — HYDROCHLOROTHIAZIDE 25 MG PO TABS: 25.0000 mg | ORAL_TABLET | Freq: Every day | ORAL | 1 refills | Status: DC

## 2021-01-19 NOTE — Progress Notes (Signed)
Established Patient Office Visit  Subjective:  Patient ID: Mark Bass, male    DOB: Apr 09, 1961  Age: 60 y.o. MRN: [id number]  CC: Blood pressure check    HPI Mark Bass is a 60 year old [place] male ( interpretor [id number])presents for management of HTN. Denies shortness of breath, headaches, chest pain or lower extremity edema .   Past Medical History:  Diagnosis Date   Hypertension     Past Surgical History:  Procedure Laterality Date   NECK SURGERY      Family History  Family history unknown: Yes    Social History   Socioeconomic History   Marital status: Single    Spouse name: Not on file   Number of children: Not on file   Years of education: Not on file   Highest education level: Not on file  Occupational History   Not on file  Tobacco Use   Smoking status: Former    Pack years: 0.00   Smokeless tobacco: Never   Tobacco comments:    Patient smoked in his early 87s.  Substance and Sexual Activity   Alcohol use: Not Currently   Drug use: Never   Sexual activity: Not on file  Other Topics Concern   Not on file  Social History Narrative   Not on file   Social Determinants of Health   Financial Resource Strain: Not on file  Food Insecurity: Not on file  Transportation Needs: Not on file  Physical Activity: Not on file  Stress: Not on file  Social Connections: Not on file  Intimate Partner Violence: Not on file    Outpatient Medications Prior to Visit  Medication Sig Dispense Refill   amLODipine (NORVASC) 10 MG tablet Take 1 tablet (10 mg total) by mouth daily. 90 tablet 1   atorvastatin (LIPITOR) 40 MG tablet Take 1 tablet (40 mg total) by mouth daily. 90 tablet 1   hydrochlorothiazide (HYDRODIURIL) 25 MG tablet Take 1 tablet (25 mg total) by mouth daily. 90 tablet 1   metoprolol tartrate (LOPRESSOR) 25 MG tablet Take 1 tablet (25 mg total) by mouth 2 (two) times daily. 180 tablet 1   losartan (COZAAR) 50 MG tablet Take 1 tablet (50 mg  total) by mouth 2 (two) times daily. 90 tablet 1   No facility-administered medications prior to visit.    No Known Allergies  ROS Review of Systems  All other systems reviewed and are negative.    Objective:    Physical Exam Vitals reviewed.  Constitutional:      Appearance: Normal appearance.  HENT:     Head: Normocephalic.     Right Ear: Tympanic membrane and external ear normal.     Left Ear: Tympanic membrane and external ear normal.     Nose: Nose normal.  Eyes:     Extraocular Movements: Extraocular movements intact.     Pupils: Pupils are equal, round, and reactive to light.  Cardiovascular:     Rate and Rhythm: Normal rate and regular rhythm.  Pulmonary:     Effort: Pulmonary effort is normal.     Breath sounds: Normal breath sounds.  Abdominal:     General: Bowel sounds are normal. There is distension.     Palpations: Abdomen is soft.  Musculoskeletal:        General: Normal range of motion.     Cervical back: Normal range of motion.  Skin:    General: Skin is warm and dry.  Neurological:  Mental Status: He is alert and oriented to person, place, and time.  Psychiatric:        Mood and Affect: Mood normal.        Behavior: Behavior normal.        Thought Content: Thought content normal.        Judgment: Judgment normal.   BP (!) 190/100 Comment: provider aware.  Pulse 97   Resp 16   Wt 127 lb (57.6 kg)   SpO2 98%   BMI 24.00 kg/m  Wt Readings from Last 3 Encounters:  01/19/21 127 lb (57.6 kg)  11/12/20 129 lb (58.5 kg)  10/28/20 134 lb 9.6 oz (61.1 kg)     Health Maintenance Due  Topic Date Due   COVID-19 Vaccine (1) Never done   Hepatitis C Screening  Never done   COLONOSCOPY (Pts 45-16yrs Insurance coverage will need to be confirmed)  Never done   Zoster Vaccines- Shingrix (1 of 2) Never done    There are no preventive care reminders to display for this patient.  No results found for: TSH Lab Results  Component Value Date   WBC  10.3 10/13/2020   HGB 14.3 10/13/2020   HCT 44.5 10/13/2020   MCV 66.5 (L) 10/13/2020   PLT 223 10/13/2020   Lab Results  Component Value Date   NA 136 10/13/2020   K 3.8 10/13/2020   CO2 26 10/13/2020   GLUCOSE 120 (H) 10/13/2020   BUN 19 10/13/2020   CREATININE 1.10 10/13/2020   BILITOT 0.6 10/10/2020   ALKPHOS 67 10/10/2020   AST 23 10/10/2020   ALT 17 10/10/2020   PROT 7.9 10/10/2020   ALBUMIN 3.8 10/10/2020   CALCIUM 9.3 10/13/2020   ANIONGAP 8 10/13/2020   Lab Results  Component Value Date   CHOL 194 10/10/2020   Lab Results  Component Value Date   HDL 51 10/10/2020   Lab Results  Component Value Date   LDLCALC 110 (H) 10/10/2020   Lab Results  Component Value Date   TRIG 165 (H) 10/10/2020   Lab Results  Component Value Date   CHOLHDL 3.8 10/10/2020   Lab Results  Component Value Date   HGBA1C 6.6 (H) 10/10/2020      Assessment & Plan:  Diagnoses and all orders for this visit:  Stroke, 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HCC) -     cloNIDine (CATAPRES) tablet 0.2 mg Followed by neurology   Essential hypertension Uncontrolled refer to cardiologist states doesn't have time stated then you have time to have another stroke. Than he said ok I will go Counseled on blood pressure goal of less than 130/80, low-sodium, DASH diet, medication compliance, 150 minutes of moderate intensity exercise per week. Discussed medication compliance, adverse effects. Unclear and interpretor how he is taking medication he states one thing ask a question different answer will refer to cardiologists for evaluation. -     -     cloNIDine (CATAPRES) tablet 0.2 mg  Colon cancer screening -     Ambulatory referral to Gastroenterology  Medication refill -     -     atorvastatin (LIPITOR) 40 MG tablet; Take 1 tablet (40 mg total) by mouth daily. -     -     losartan (COZAAR) 50 MG tablet; Take 1 tablet (50 mg total) by mouth 2 (two) times daily.   Meds ordered this encounter  Medications    DISCONTD: losartan (COZAAR) 50 MG tablet    Sig: Take 1 tablet (50 mg total)  by mouth 2 (two) times daily.    Dispense:  90 tablet    Refill:  1   atorvastatin (LIPITOR) 40 MG tablet    Sig: Take 1 tablet (40 mg total) by mouth daily.    Dispense:  90 tablet    Refill:  1   DISCONTD: hydrochlorothiazide (HYDRODIURIL) 25 MG tablet    Sig: Take 1 tablet (25 mg total) by mouth daily.    Dispense:  90 tablet    Refill:  1   DISCONTD: metoprolol tartrate (LOPRESSOR) 25 MG tablet    Sig: Take 1 tablet (25 mg total) by mouth 2 (two) times daily.    Dispense:  180 tablet    Refill:  1   DISCONTD: amLODipine (NORVASC) 10 MG tablet    Sig: Take 1 tablet (10 mg total) by mouth daily.    Dispense:  90 tablet    Refill:  1   losartan (COZAAR) 50 MG tablet    Sig: Take 1 tablet (50 mg total) by mouth 2 (two) times daily.    Dispense:  180 tablet    Refill:  1   cloNIDine (CATAPRES) tablet 0.2 mg   hydrochlorothiazide (HYDRODIURIL) 25 MG tablet    Sig: Take 1 tablet (25 mg total) by mouth daily.    Dispense:  90 tablet    Refill:  1   metoprolol tartrate (LOPRESSOR) 25 MG tablet    Sig: Take 1 tablet (25 mg total) by mouth 2 (two) times daily.    Dispense:  180 tablet    Refill:  1   amLODipine (NORVASC) 10 MG tablet    Sig: Take 1 tablet (10 mg total) by mouth daily.    Dispense:  90 tablet    Refill:  1     Follow-up: Return in about 3 months (around 04/21/2021) for HTN.    Grayce Sessions, NP

## 2021-01-19 NOTE — Progress Notes (Signed)
Numbness left leg. Uses walking stick (curtain rod)  States his visit is for BP.  He picked up medication from pharmacy 3 days. Has taken medication from home

## 2021-01-27 ENCOUNTER — Other Ambulatory Visit: Payer: Self-pay

## 2021-01-27 ENCOUNTER — Ambulatory Visit: Payer: 59

## 2021-01-27 DIAGNOSIS — R2689 Other abnormalities of gait and mobility: Secondary | ICD-10-CM

## 2021-01-27 DIAGNOSIS — M6281 Muscle weakness (generalized): Secondary | ICD-10-CM

## 2021-01-27 DIAGNOSIS — R2681 Unsteadiness on feet: Secondary | ICD-10-CM

## 2021-01-27 NOTE — Patient Instructions (Signed)
Walking Program: Walk with your wife. Walk during cooler times of the day. Starting next week, Walk 6 min per day for 7 days. Following week add 2 minutes to your total time. Week 1 6 min Week 2: 8 min Week 3: 10 min Week 4: 12 min.....Until you can walk to 30 min per day  

## 2021-01-27 NOTE — Therapy (Signed)
OUTPATIENT PHYSICAL THERAPY TREATMENT/RE-CERTIFICATION NOTE   Patient Name: Mark Bass MRN: [id number] DOB:Oct 26, 1960, 60 y.o., male Today's Date: 01/27/2021  PCP: Grayce Sessions, NP REFERRING PROVIDER: Ihor Austin, NP   PT End of Session - 01/27/21 1459     Visit Number 4    Number of Visits 10    Date for PT Re-Evaluation 03/03/21    Authorization Type UHC (30 VL between PT/OT)    Progress Note Due on Visit 8    PT Start Time 1445    PT Stop Time 1530    PT Time Calculation (min) 45 min    Equipment Utilized During Treatment Gait belt    Activity Tolerance Patient tolerated treatment well             Past Medical History:  Diagnosis Date   Hypertension    Past Surgical History:  Procedure Laterality Date   NECK SURGERY     Patient Active Problem List   Diagnosis Date Noted   Stroke, 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HCC) 10/10/2020   COVID-19 virus infection 01/02/2019   Pneumonia due to COVID-19 virus 01/01/2019   Elevated blood pressure reading without diagnosis of hypertension 01/01/2019   Dehydration 01/01/2019   Sepsis (HCC) 01/01/2019    REFERRING DIAG: I61.9 (ICD-10-CM) - Stroke, 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HCC)  THERAPY DIAG:  Unsteadiness on feet  Other abnormalities of gait and mobility  Muscle weakness (generalized)  PERTINENT HISTORY: HTN, Hemorrhagic stroke in the right basal ganglia (10/2020)  PRECAUTIONS: None  SUBJECTIVE: No change since last session.  Has brought RW today which is his perferred AD. 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 Interpreter Name/ID: Gracelyn Nurse (Language Resources).  Continues to report L side numbness and weakness.  PAIN:  Are you having pain? No Denies Pain, but reports continued numbness.    OBJECTIVE/TREATMENT:   01/27/21:  Standing deadlift: 15lbs 2 x 10 Sit to stand: from 14" box: 15lbs 2 x 5 SLS: 5 x 10 sec R and L Floor transfers: 2x, moderate cueing required Tennis balls put on walker for patient Discussed walking program  (see pt instructions)   01/18/21   Supine bridging 2x10  B hip fallouts 2x10  Heel slides, alt., 2x10  Supine march alt. 2x10  10x STS on Airex  Standing with foam roll for support, Heel raises, toe raises, abd and marching 2x10, alt. where appropriate  Scifit L1 6 min. Arms 6    01/13/21 Performed 6 MWT  10x STS arms crossed  Heel/toe raise x10  Standing marching x10 alt.  Standing hip ext x10 B  Standing hip ABD x10 B  Tandem stand EO/EC 30s hold ea. Position   01/06/21 Due to extended absence from PT services, completed reassessment during today's session of the following:  Patient ambulating with walking stick into session, then without device during session. Completed ambulation in therapy gym without AD x 100 ft, mild buckling noted in LLE that was not noted with use of AD. CGA without use of AD.  5x sit <> stand: 16.75 secs with light UE support on BLE 10 Meter walk test: 12.44 secs = 0.80 m/s. Fatigue reported 10/10 after completion    Reviewed HEP and progressed to patient's tolerance. Completed all of the following exercises during today's session, intermittent rest breaks required due to fatigue. Handout provided and reviewed with patient: Sit to Stand with Arms Crossed - 2 x daily - 7 x weekly - 2 sets - 10 reps Heel Toe Raises with Counter Support - 1 x daily - 7 x  weekly - 2 sets - 10 reps Standing Hip Extension with Counter Support - 1 x daily - 7 x weekly - 2 sets - 10 reps Standing Hip Abduction with Counter Support - 1 x daily - 7 x weekly - 2 sets - 10 reps   PATIENT EDUCATION: Education details: Occupational Therapy Referral and Benefits of OT Eval;  Berg Balance Score; Updated HEP Person educated: Patient Education method: Explanation and Demonstration Education comprehension: verbalized understanding, verbal cues required and needs further education     HOME EXERCISE PROGRAM: Access Code: [email or web address] URL: [email or web address] Date:  01/06/2021 Prepared by: Jethro Bastos  Exercises Sit to Stand with Arms Crossed - 2 x daily - 7 x weekly - 2 sets - 10 reps Heel Toe Raises with Counter Support - 1 x daily - 7 x weekly - 2 sets - 10 reps Standing Hip Extension with Counter Support - 1 x daily - 7 x weekly - 2 sets - 10 reps Standing Hip Abduction with Counter Support - 1 x daily - 7 x weekly - 2 sets - 10 reps      ASSESSMENT:   CLINICAL IMPRESSION:   Pt tolerated session well. Progressed functional bending and sit to stand from lower surface with increased resistance. Practiced floor transfers but pt requires moderate cueing for sequencing for safety.    REHAB POTENTIAL: Good   CLINICAL DECISION MAKING: Stable/uncomplicated   EVALUATION COMPLEXITY: Low     GOALS: Goals reviewed with patient? Yes   SHORT TERM GOALS:  STG Name Target Date Goal status  1 Patient will undergo further assessment of endurance with 2MWT/6MWT and LTG to be set as applicable Baseline: TBA 02/03/21 6 MWT Completed 01/13/21     LONG TERM GOALS:   LTG Name Target Date Goal status  1 Patient will improve 5x sit <> stand to </= 12 seconds without UE support to demo improved balance and functional strength Comments:  03/03/21 INITIAL  2 Pt will demo gait speed increase by 0.10 m/s to improve functional ambulation. Comments:  03/03/21 INITIAL  3 LTG to be set for 2MWT/6MWT as appropriate Comments: TBA 03/03/21 01/13/21 Goal is 1088ft  4 Pt will be independent with balance/strength HEP  Comments: HEP established 03/03/21 INITIAL    PLAN: PT FREQUENCY: 1x/week   PT DURATION: 8 weeks   PLANNED INTERVENTIONS: Therapeutic exercises, Therapeutic activity, Neuro Muscular re-education, Balance training, Gait training, Patient/Family education, Joint mobilization, Stair training, Orthotic/Fit training, Electrical stimulation, Cryotherapy and Moist heat   PLAN FOR NEXT SESSION: Continue LLE functional strengthening. Continue gait training  without AD. Balance and core strengthening emphasizing SLS L   Ileana Ladd, PT 01/27/2021, 3:35 PM    [facility] [address] [place], [place], [address] Phone: [phone]   Fax:  [phone]  Patient name: Edgardo Petrenko MRN: [id number] DOB: Jul 13, 1961   [facility] [address] [place], [place], [address] Phone: [phone]   Fax:  [phone]  Physical Therapy Treatment  Patient Details  Name: Adolfo Granieri MRN: [id number] Date of Birth: Jan 15, 1961 No data recorded  Encounter Date: 01/27/2021   PT End of Session - 01/27/21 1459     Visit Number 4    Number of Visits 10    Date for PT Re-Evaluation 03/03/21    Authorization Type UHC (30 VL between PT/OT)    Progress Note Due on Visit 8    PT Start Time 1445    PT  Stop Time 1530    PT Time Calculation (min) 45 min    Equipment Utilized During Treatment Gait belt    Activity Tolerance Patient tolerated treatment well             Past Medical History:  Diagnosis Date   Hypertension     Past Surgical History:  Procedure Laterality Date   NECK SURGERY      There were no vitals filed for this visit.                                           Patient will benefit from skilled therapeutic intervention in order to improve the following deficits and impairments:     Visit Diagnosis: Unsteadiness on feet  Other abnormalities of gait and mobility  Muscle weakness (generalized)     Problem List Patient Active Problem List   Diagnosis Date Noted   Stroke, 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HCC) 10/10/2020   COVID-19 virus infection 01/02/2019   Pneumonia due to COVID-19 virus 01/01/2019   Elevated blood pressure reading without diagnosis of hypertension 01/01/2019   Dehydration 01/01/2019   Sepsis (HCC) 01/01/2019    Ileana Ladd PT 01/27/2021, 3:35 PM  [facility] [address] [place], [place], [address] Phone: [phone]   Fax:  [phone]  Name: Burtis Imhoff MRN: [id number] Date of Birth: 01-Nov-1960

## 2021-01-30 ENCOUNTER — Other Ambulatory Visit (INDEPENDENT_AMBULATORY_CARE_PROVIDER_SITE_OTHER): Payer: Self-pay | Admitting: Primary Care

## 2021-01-30 DIAGNOSIS — Z760 Encounter for issue of repeat prescription: Secondary | ICD-10-CM

## 2021-01-30 DIAGNOSIS — I10 Essential (primary) hypertension: Secondary | ICD-10-CM

## 2021-01-30 NOTE — Telephone Encounter (Signed)
Last RF 01/19/21

## 2021-02-01 ENCOUNTER — Other Ambulatory Visit: Payer: Self-pay

## 2021-02-01 ENCOUNTER — Ambulatory Visit: Payer: 59

## 2021-02-01 DIAGNOSIS — R2689 Other abnormalities of gait and mobility: Secondary | ICD-10-CM | POA: Diagnosis not present

## 2021-02-01 DIAGNOSIS — M6281 Muscle weakness (generalized): Secondary | ICD-10-CM

## 2021-02-01 DIAGNOSIS — R2681 Unsteadiness on feet: Secondary | ICD-10-CM

## 2021-02-01 NOTE — Therapy (Signed)
OUTPATIENT PHYSICAL THERAPY TREATMENT/RE-CERTIFICATION NOTE   Patient Name: Mark Bass MRN: [id number] DOB:[birth year], 60 y.o., male Today's Date: 02/01/2021  PCP: Grayce Sessions, NP REFERRING PROVIDER: Ihor Austin, NP   PT End of Session - 02/01/21 1452     Visit Number 5    Number of Visits 10    Date for PT Re-Evaluation 03/03/21    Authorization Type UHC (30 VL between PT/OT)    Progress Note Due on Visit 8    PT Start Time 1445    PT Stop Time 1530    PT Time Calculation (min) 45 min    Equipment Utilized During Treatment Gait belt    Activity Tolerance Patient tolerated treatment well             Past Medical History:  Diagnosis Date   Hypertension    Past Surgical History:  Procedure Laterality Date   NECK SURGERY     Patient Active Problem List   Diagnosis Date Noted   Stroke, 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HCC) 10/10/2020   COVID-19 virus infection 01/02/2019   Pneumonia due to COVID-19 virus 01/01/2019   Elevated blood pressure reading without diagnosis of hypertension 01/01/2019   Dehydration 01/01/2019   Sepsis (HCC) 01/01/2019    REFERRING DIAG: I61.9 (ICD-10-CM) - Stroke, 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HCC)  THERAPY DIAG:  Unsteadiness on feet  Other abnormalities of gait and mobility  Muscle weakness (generalized)  PERTINENT HISTORY: HTN, Hemorrhagic stroke in the right basal ganglia (10/2020)  PRECAUTIONS: None  SUBJECTIVE: No change since last session.  Has brought RW today which is his perferred AD. 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 Interpreter Name/ID: Gracelyn Nurse (Language Resources).  Continues to report L side numbness and weakness.  PAIN:  Are you having pain? No Denies Pain, but reports continued numbness.    OBJECTIVE/TREATMENT:   01/27/21:  Standing deadlift: 15lbs 2 x 10 Sit to stand: from 14" box: 15lbs 2 x 5 SLS: 5 x 10 sec R and L Floor transfers: 2x, moderate cueing required Tennis balls put on walker for patient Discussed walking program  (see pt instructions)   01/18/21   Supine bridging 2x10  B hip fallouts 2x10  Heel slides, alt., 2x10  Supine march alt. 2x10  10x STS on Airex  Standing with foam roll for support, Heel raises, toe raises, abd and marching 2x10, alt. where appropriate  Scifit L1 6 min. Arms 6    01/13/21 Performed 6 MWT  10x STS arms crossed  Heel/toe raise x10  Standing marching x10 alt.  Standing hip ext x10 B  Standing hip ABD x10 B  Tandem stand EO/EC 30s hold ea. Position   01/06/21 Due to extended absence from PT services, completed reassessment during today's session of the following:  Patient ambulating with walking stick into session, then without device during session. Completed ambulation in therapy gym without AD x 100 ft, mild buckling noted in LLE that was not noted with use of AD. CGA without use of AD.  5x sit <> stand: 16.75 secs with light UE support on BLE 10 Meter walk test: 12.44 secs = 0.80 m/s. Fatigue reported 10/10 after completion    Reviewed HEP and progressed to patient's tolerance. Completed all of the following exercises during today's session, intermittent rest breaks required due to fatigue. Handout provided and reviewed with patient: Sit to Stand with Arms Crossed - 2 x daily - 7 x weekly - 2 sets - 10 reps Heel Toe Raises with Counter Support - 1 x daily - 7 x  weekly - 2 sets - 10 reps Standing Hip Extension with Counter Support - 1 x daily - 7 x weekly - 2 sets - 10 reps Standing Hip Abduction with Counter Support - 1 x daily - 7 x weekly - 2 sets - 10 reps   PATIENT EDUCATION: Education details: Occupational Therapy Referral and Benefits of OT Eval;  Berg Balance Score; Updated HEP Person educated: Patient Education method: Explanation and Demonstration Education comprehension: verbalized understanding, verbal cues required and needs further education     HOME EXERCISE PROGRAM: Access Code: [email or web address] URL: [email or web address] Date:  01/06/2021 Prepared by: Jethro Bastos  Exercises Sit to Stand with Arms Crossed - 2 x daily - 7 x weekly - 2 sets - 10 reps Heel Toe Raises with Counter Support - 1 x daily - 7 x weekly - 2 sets - 10 reps Standing Hip Extension with Counter Support - 1 x daily - 7 x weekly - 2 sets - 10 reps Standing Hip Abduction with Counter Support - 1 x daily - 7 x weekly - 2 sets - 10 reps      ASSESSMENT:   CLINICAL IMPRESSION:   Pt tolerated session well. Progressed functional bending and sit to stand from lower surface with increased resistance. Practiced floor transfers but pt requires moderate cueing for sequencing for safety.    REHAB POTENTIAL: Good   CLINICAL DECISION MAKING: Stable/uncomplicated   EVALUATION COMPLEXITY: Low     GOALS: Goals reviewed with patient? Yes   SHORT TERM GOALS:  STG Name Target Date Goal status  1 Patient will undergo further assessment of endurance with 2MWT/6MWT and LTG to be set as applicable Baseline: TBA 02/03/21 6 MWT Completed 01/13/21     LONG TERM GOALS:   LTG Name Target Date Goal status  1 Patient will improve 5x sit <> stand to </= 12 seconds without UE support to demo improved balance and functional strength Comments:  03/03/21 INITIAL  2 Pt will demo gait speed increase by 0.10 m/s to improve functional ambulation. Comments:  03/03/21 INITIAL  3 LTG to be set for 2MWT/6MWT as appropriate Comments: TBA 03/03/21 01/13/21 Goal is 1034ft  4 Pt will be independent with balance/strength HEP  Comments: HEP established 03/03/21 INITIAL    PLAN: PT FREQUENCY: 1x/week   PT DURATION: 8 weeks   PLANNED INTERVENTIONS: Therapeutic exercises, Therapeutic activity, Neuro Muscular re-education, Balance training, Gait training, Patient/Family education, Joint mobilization, Stair training, Orthotic/Fit training, Electrical stimulation, Cryotherapy and Moist heat   PLAN FOR NEXT SESSION: Continue LLE functional strengthening. Continue gait training  without AD. Balance and core strengthening emphasizing SLS L   Hildred Laser, PT 02/01/2021, 2:53 PM    [facility] [address] [place], [place], [address] Phone: [phone]   Fax:  [phone]  Patient name: Mark Bass MRN: [id number] DOB: 1961-02-03   [facility] [address] [place], [place], [address] Phone: [phone]   Fax:  [phone]  Physical Therapy Treatment  Patient Details  Name: Mark Bass MRN: [id number] Date of Birth: 03-06-1961 No data recorded  Encounter Date: 02/01/2021   PT End of Session - 02/01/21 1452     Visit Number 5    Number of Visits 10    Date for PT Re-Evaluation 03/03/21    Authorization Type UHC (30 VL between PT/OT)    Progress Note Due on Visit 8    PT Start Time 1445    PT  Stop Time 1530    PT Time Calculation (min) 45 min    Equipment Utilized During Treatment Gait belt    Activity Tolerance Patient tolerated treatment well             Past Medical History:  Diagnosis Date   Hypertension     Past Surgical History:  Procedure Laterality Date   NECK SURGERY      There were no vitals filed for this visit.                                           Patient will benefit from skilled therapeutic intervention in order to improve the following deficits and impairments:     Visit Diagnosis: Unsteadiness on feet  Other abnormalities of gait and mobility  Muscle weakness (generalized)     Problem List Patient Active Problem List   Diagnosis Date Noted   Stroke, 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HCC) 10/10/2020   COVID-19 virus infection 01/02/2019   Pneumonia due to COVID-19 virus 01/01/2019   Elevated blood pressure reading without diagnosis of hypertension 01/01/2019   Dehydration 01/01/2019   Sepsis (HCC) 01/01/2019    Hildred Laser PT 02/01/2021, 2:53 PM  [facility] [address] [place], [place], [address] Phone: [phone]   Fax:  [phone]  Name: Mark Bass MRN: [id number] Date of Birth: 1960/09/12

## 2021-02-01 NOTE — Therapy (Signed)
OUTPATIENT PHYSICAL THERAPY TREATMENT/RE-CERTIFICATION NOTE   Patient Name: Mark Bass MRN: [id number] DOB:February 05, 1961, 60 y.o., male Today's Date: 02/01/2021  PCP: Grayce Sessions, NP REFERRING PROVIDER: Ihor Austin, NP   PT End of Session - 02/01/21 1452     Visit Number 5    Number of Visits 10    Date for PT Re-Evaluation 03/03/21    Authorization Type U[employer] (30 VL between PT/OT)    Progress Note Due on Visit 8    PT Start Time 1445    PT Stop Time 1530    PT Time Calculation (min) 45 min    Equipment Utilized During Treatment Gait belt    Activity Tolerance Patient tolerated treatment well             Past Medical History:  Diagnosis Date   Hypertension    Past Surgical History:  Procedure Laterality Date   NECK SURGERY     Patient Active Problem List   Diagnosis Date Noted   Stroke, 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HCC) 10/10/2020   COVID-19 virus infection 01/02/2019   Pneumonia due to COVID-19 virus 01/01/2019   Elevated blood pressure reading without diagnosis of hypertension 01/01/2019   Dehydration 01/01/2019   Sepsis (HCC) 01/01/2019    REFERRING DIAG: I61.9 (ICD-10-CM) - Stroke, 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HCC)  THERAPY DIAG:  Unsteadiness on feet  Other abnormalities of gait and mobility  Muscle weakness (generalized)  PERTINENT HISTORY: HTN, Hemorrhagic stroke in the right basal ganglia (10/2020)  PRECAUTIONS: None  SUBJECTIVE: No change since last session.  RW today which is his perferred AD. 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 Interpreter Name/ID: Gracelyn Nurse (Language [employer]).  Continues to report L side numbness and weakness, less over time  PAIN:  Are you having pain? No Denies Pain, but reports continued numbness.    OBJECTIVE/TREATMENT:   02/01/21  Supine bridging 2x10 with ball squeeze  B hip fallouts 2x10 red band   Heel slides, alt., 2x10  Supine march alt. 2x10 with 1# weights  10x STS on Airex, arms crossed with OH reach when standing  Standing  with foam roll for support, Heel raises, toe raises, abd and marching 2x10, alt. where appropriate  Scifit L1 6 min. Arms 6  01/27/21:  Standing deadlift: 15lbs 2 x 10 Sit to stand: from 14" box: 15lbs 2 x 5 SLS: 5 x 10 sec R and L Floor transfers: 2x, moderate cueing required Tennis balls put on walker for patient Discussed walking program (see pt instructions)    01/06/21 Due to extended absence from PT services, completed reassessment during today's session of the following:  Patient ambulating with walking stick into session, then without device during session. Completed ambulation in therapy gym without AD x 100 ft, mild buckling noted in LLE that was not noted with use of AD. CGA without use of AD.  5x sit <> stand: 16.75 secs with light UE support on BLE 10 Meter walk test: 12.44 secs = 0.80 m/s. Fatigue reported 10/10 after completion    Reviewed HEP and progressed to patient's tolerance. Completed all of the following exercises during today's session, intermittent rest breaks required due to fatigue. Handout provided and reviewed with patient: Sit to Stand with Arms Crossed - 2 x daily - 7 x weekly - 2 sets - 10 reps Heel Toe Raises with Counter Support - 1 x daily - 7 x weekly - 2 sets - 10 reps Standing Hip Extension with Counter Support - 1 x daily - 7 x weekly - 2 sets -  10 reps Standing Hip Abduction with Counter Support - 1 x daily - 7 x weekly - 2 sets - 10 reps   PATIENT EDUCATION: Education details: Occupational Therapy Referral and Benefits of OT Eval;  Berg Balance Score; Updated HEP Person educated: Patient Education method: Explanation and Demonstration Education comprehension: verbalized understanding, verbal cues required and needs further education     HOME EXERCISE PROGRAM: Access Code: [email or web address] URL: [email or web address] Date: 01/06/2021 Prepared by: Jethro Bastos  Exercises Sit to Stand with Arms Crossed - 2 x daily - 7 x weekly - 2 sets  - 10 reps Heel Toe Raises with Counter Support - 1 x daily - 7 x weekly - 2 sets - 10 reps Standing Hip Extension with Counter Support - 1 x daily - 7 x weekly - 2 sets - 10 reps Standing Hip Abduction with Counter Support - 1 x daily - 7 x weekly - 2 sets - 10 reps      ASSESSMENT:   CLINICAL IMPRESSION:   Pt tolerated session well. Continued with strengthening and functional balance activities.  Added eight and difficulty to tasks to challenge patient and improve function  REHAB POTENTIAL: Good   CLINICAL DECISION MAKING: Stable/uncomplicated   EVALUATION COMPLEXITY: Low     GOALS: Goals reviewed with patient? Yes   SHORT TERM GOALS:  STG Name Target Date Goal status  1 Patient will undergo further assessment of endurance with 2MWT/6MWT and LTG to be set as applicable Baseline: TBA 02/03/21 6 MWT Completed 01/13/21     LONG TERM GOALS:   LTG Name Target Date Goal status  1 Patient will improve 5x sit <> stand to </= 12 seconds without UE support to demo improved balance and functional strength Comments:  03/03/21 02/01/21 5x STS w/o UE assist 15.1s  2 Pt will demo gait speed increase by 0.10 m/s to improve functional ambulation. Comments:  03/03/21 INITIAL  3 LTG to be set for 2MWT/6MWT as appropriate Comments: TBA 03/03/21 01/13/21 Goal is 1095ft  4 Pt will be independent with balance/strength HEP  Comments: HEP established 03/03/21 INITIAL    PLAN: PT FREQUENCY: 1x/week   PT DURATION: 8 weeks   PLANNED INTERVENTIONS: Therapeutic exercises, Therapeutic activity, Neuro Muscular re-education, Balance training, Gait training, Patient/Family education, Joint mobilization, Stair training, Orthotic/Fit training, Electrical stimulation, Cryotherapy and Moist heat   PLAN FOR NEXT SESSION: Continue LLE functional strengthening. Continue gait training without AD. Balance and core strengthening emphasizing SLS L, TKE L knee   Hildred Laser, PT 02/01/2021, 2:56 PM    [facility] [address] [place], [place], [address] Phone: [phone]   Fax:  [phone]  Patient name: Mark Bass MRN: [id number] DOB: Aug 16, 1960

## 2021-02-09 ENCOUNTER — Other Ambulatory Visit: Payer: Self-pay

## 2021-02-09 ENCOUNTER — Ambulatory Visit: Payer: 59 | Attending: Internal Medicine

## 2021-02-09 DIAGNOSIS — R41844 Frontal lobe and executive function deficit: Secondary | ICD-10-CM | POA: Insufficient documentation

## 2021-02-09 DIAGNOSIS — M6281 Muscle weakness (generalized): Secondary | ICD-10-CM | POA: Insufficient documentation

## 2021-02-09 DIAGNOSIS — R41840 Attention and concentration deficit: Secondary | ICD-10-CM | POA: Insufficient documentation

## 2021-02-09 DIAGNOSIS — R278 Other lack of coordination: Secondary | ICD-10-CM | POA: Insufficient documentation

## 2021-02-09 DIAGNOSIS — R2681 Unsteadiness on feet: Secondary | ICD-10-CM | POA: Insufficient documentation

## 2021-02-09 DIAGNOSIS — R2689 Other abnormalities of gait and mobility: Secondary | ICD-10-CM | POA: Insufficient documentation

## 2021-02-09 DIAGNOSIS — I69854 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: Secondary | ICD-10-CM | POA: Insufficient documentation

## 2021-02-09 NOTE — Therapy (Signed)
OUTPATIENT PHYSICAL THERAPY TREATMENT/RE-CERTIFICATION NOTE   Patient Name: Mark Bass MRN: [id number] DOB:[birth year], 60 y.o., male Today's Date: 02/09/2021  PCP: Grayce Sessions, NP REFERRING PROVIDER: Grayce Sessions, NP   PT End of Session - 02/09/21 1444     Visit Number 6    Number of Visits 10    Date for PT Re-Evaluation 03/03/21    Authorization Type UHC (30 VL between PT/OT)    Progress Note Due on Visit 8    PT Start Time 1445    PT Stop Time 1530    PT Time Calculation (min) 45 min    Equipment Utilized During Treatment Gait belt    Activity Tolerance Patient tolerated treatment well             Past Medical History:  Diagnosis Date   Hypertension    Past Surgical History:  Procedure Laterality Date   NECK SURGERY     Patient Active Problem List   Diagnosis Date Noted   Stroke, 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HCC) 10/10/2020   COVID-19 virus infection 01/02/2019   Pneumonia due to COVID-19 virus 01/01/2019   Elevated blood pressure reading without diagnosis of hypertension 01/01/2019   Dehydration 01/01/2019   Sepsis (HCC) 01/01/2019    REFERRING DIAG: I61.9 (ICD-10-CM) - Stroke, 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HCC)  THERAPY DIAG:  Unsteadiness on feet  Other abnormalities of gait and mobility  Muscle weakness (generalized)  PERTINENT HISTORY: HTN, Hemorrhagic stroke in the right basal ganglia (10/2020)  PRECAUTIONS: None  SUBJECTIVE: Slight improvement overall as he is able to move better.  RW today which is his perferred AD. 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 Interpreter Name/ID: Gracelyn Nurse (Language Resources). Still noting L sided symptoms.  Has not scheduled OT yet.  PAIN:  Are you having pain? No Denies Pain, but reports continued numbness.    OBJECTIVE/TREATMENT:   02/09/21     15x STS arms crossed with OH reach when standing  Standing with foam roll for support, Heel raises, toe raises, abd and marching 15x, alt. where appropriate  Scifit L1 8 min. Arms  6  FAQs and marching with latissimus pressdown, 15x alternating pattern  Seated core exercises of hip and shoulder tosses, chops and Vs for 10 reps ea. 1.1# ball  Standing TKE with red band, 15x at 3 angles of pull  02/01/21  Supine bridging 2x10 with ball squeeze  B hip fallouts 2x10 red band   Heel slides, alt., 2x10  Supine march alt. 2x10 with 1# weights  10x STS on Airex, arms crossed with OH reach when standing  Standing with foam roll for support, Heel raises, toe raises, abd and marching 2x10, alt. where appropriate  Scifit L1 6 min. Arms 6  01/27/21:  Standing deadlift: 15lbs 2 x 10 Sit to stand: from 14" box: 15lbs 2 x 5 SLS: 5 x 10 sec R and L Floor transfers: 2x, moderate cueing required Tennis balls put on walker for patient Discussed walking program (see pt instructions)    01/06/21 Due to extended absence from PT services, completed reassessment during today's session of the following:  Patient ambulating with walking stick into session, then without device during session. Completed ambulation in therapy gym without AD x 100 ft, mild buckling noted in LLE that was not noted with use of AD. CGA without use of AD.  5x sit <> stand: 16.75 secs with light UE support on BLE 10 Meter walk test: 12.44 secs = 0.80 m/s. Fatigue reported 10/10 after completion    Reviewed HEP  and progressed to patient's tolerance. Completed all of the following exercises during today's session, intermittent rest breaks required due to fatigue. Handout provided and reviewed with patient: Sit to Stand with Arms Crossed - 2 x daily - 7 x weekly - 2 sets - 10 reps Heel Toe Raises with Counter Support - 1 x daily - 7 x weekly - 2 sets - 10 reps Standing Hip Extension with Counter Support - 1 x daily - 7 x weekly - 2 sets - 10 reps Standing Hip Abduction with Counter Support - 1 x daily - 7 x weekly - 2 sets - 10 reps   PATIENT EDUCATION: Education details: Occupational Therapy Referral and Benefits  of OT Eval;  Berg Balance Score; Updated HEP Person educated: Patient Education method: Explanation and Demonstration Education comprehension: verbalized understanding, verbal cues required and needs further education     HOME EXERCISE PROGRAM: Access Code: [email or web address] URL: [email or web address] Date: 01/06/2021 Prepared by: Jethro Bastos  Exercises Sit to Stand with Arms Crossed - 2 x daily - 7 x weekly - 2 sets - 10 reps Heel Toe Raises with Counter Support - 1 x daily - 7 x weekly - 2 sets - 10 reps Standing Hip Extension with Counter Support - 1 x daily - 7 x weekly - 2 sets - 10 reps Standing Hip Abduction with Counter Support - 1 x daily - 7 x weekly - 2 sets - 10 reps      ASSESSMENT:   CLINICAL IMPRESSION:   Increased resistance , difficulty and time as noted.  Continued focus on LLE strength, coordination and TKE to improve gait and mobility.  Encouraged to schedule OT consult.   REHAB POTENTIAL: Good   CLINICAL DECISION MAKING: Stable/uncomplicated   EVALUATION COMPLEXITY: Low     GOALS: Goals reviewed with patient? Yes   SHORT TERM GOALS:  STG Name Target Date Goal status  1 Patient will undergo further assessment of endurance with 2MWT/6MWT and LTG to be set as applicable Baseline: TBA 02/03/21 6 MWT Completed 01/13/21     LONG TERM GOALS:   LTG Name Target Date Goal status  1 Patient will improve 5x sit <> stand to </= 12 seconds without UE support to demo improved balance and functional strength Comments:  03/03/21 02/01/21 5x STS w/o UE assist 15.1s  2 Pt will demo gait speed increase by 0.10 m/s to improve functional ambulation. Comments:  03/03/21 INITIAL  3 LTG to be set for 2MWT/6MWT as appropriate Comments: TBA 03/03/21 01/13/21 Goal is 1066ft  4 Pt will be independent with balance/strength HEP  Comments: HEP established 03/03/21 INITIAL    PLAN: PT FREQUENCY: 1x/week   PT DURATION: 8 weeks   PLANNED INTERVENTIONS: Therapeutic  exercises, Therapeutic activity, Neuro Muscular re-education, Balance training, Gait training, Patient/Family education, Joint mobilization, Stair training, Orthotic/Fit training, Electrical stimulation, Cryotherapy and Moist heat   PLAN FOR NEXT SESSION: Continue LLE functional strengthening and focus on TKE. Continue gait training without AD. Balance and core strengthening emphasizing SLS L   Hildred Laser, PT 02/09/2021, 2:46 PM    [facility] [address] [place], [place], [address] Phone: [phone]   Fax:  [phone]  Patient name: Mark Bass MRN: [id number] DOB: 12-Mar-1961

## 2021-02-16 ENCOUNTER — Ambulatory Visit: Payer: 59

## 2021-02-16 ENCOUNTER — Other Ambulatory Visit: Payer: Self-pay

## 2021-02-16 DIAGNOSIS — M6281 Muscle weakness (generalized): Secondary | ICD-10-CM

## 2021-02-16 DIAGNOSIS — R2681 Unsteadiness on feet: Secondary | ICD-10-CM

## 2021-02-16 DIAGNOSIS — R2689 Other abnormalities of gait and mobility: Secondary | ICD-10-CM

## 2021-02-16 NOTE — Therapy (Signed)
OUTPATIENT PHYSICAL THERAPY TREATMENT/RE-CERTIFICATION NOTE   Patient Name: Mark Bass MRN: [id number] DOB:September 15, 1960, 60 y.o., male Today's Date: 02/16/2021  PCP: Grayce Sessions, NP REFERRING PROVIDER: Grayce Sessions, NP     Past Medical History:  Diagnosis Date   Hypertension    Past Surgical History:  Procedure Laterality Date   NECK SURGERY     Patient Active Problem List   Diagnosis Date Noted   Stroke, 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HCC) 10/10/2020   COVID-19 virus infection 01/02/2019   Pneumonia due to COVID-19 virus 01/01/2019   Elevated blood pressure reading without diagnosis of hypertension 01/01/2019   Dehydration 01/01/2019   Sepsis (HCC) 01/01/2019    REFERRING DIAG: I61.9 (ICD-10-CM) - Stroke, 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HCC)  THERAPY DIAG:  No diagnosis found.  PERTINENT HISTORY: HTN, Hemorrhagic stroke in the right basal ganglia (10/2020)  PRECAUTIONS: None  SUBJECTIVE: Reports slight improvements in LLE function, numbness resolving but leg feels heavy.  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   PAIN:  Are you having pain? No Denies Pain, but reports continued numbness.    OBJECTIVE/TREATMENT:  02/16/21  15x STS arms crossed with OH reach when standing  Standing with foam roll for support, Heel raises, toe raises, abd and marching 15x, alt. where appropriate, 2#  Scifit L1.5 8 min. Arms 6  Seated core exercises of hip and shoulder tosses, chops and Vs for 10 reps ea. 2.2# ball  Standing L TKE with green band, 15x at 3 angles of pull  Seated LAQs and marching, 2#, alternating with latissimus press, 15reps  Ambulation 1107ft w/ AD encouraging TKE  on heel strike   02/09/21   15x STS arms crossed with OH reach when standing  Standing with foam roll for support, Heel raises, toe raises, abd and marching 15x, alt. where appropriate  Scifit L1 8 min. Arms 6  FAQs and marching with latissimus pressdown, 15x alternating  pattern  Seated core exercises of hip and shoulder tosses, chops and Vs for 10 reps ea. 1.1# ball  Standing TKE with red band, 15x at 3 angles of pull      Reviewed HEP and progressed to patient's tolerance. Completed all of the following exercises during today's session, intermittent rest breaks required due to fatigue. Handout provided and reviewed with patient: Sit to Stand with Arms Crossed - 2 x daily - 7 x weekly - 2 sets - 10 reps Heel Toe Raises with Counter Support - 1 x daily - 7 x weekly - 2 sets - 10 reps Standing Hip Extension with Counter Support - 1 x daily - 7 x weekly - 2 sets - 10 reps Standing Hip Abduction with Counter Support - 1 x daily - 7 x weekly - 2 sets - 10 reps   PATIENT EDUCATION: Education details: Occupational Therapy Referral and Benefits of OT Eval;  Berg Balance Score; Updated HEP Person educated: Patient Education method: Explanation and Demonstration Education comprehension: verbalized understanding, verbal cues required and needs further education     HOME EXERCISE PROGRAM: Access Code: [email or web address] URL: [email or web address] Date: 01/06/2021 Prepared by: Jethro Bastos  Exercises Sit to Stand with Arms Crossed - 2 x daily - 7 x weekly - 2 sets - 10 reps Heel Toe Raises with Counter Support - 1 x daily - 7 x weekly - 2 sets - 10 reps Standing Hip Extension with Counter Support - 1 x daily - 7 x weekly - 2 sets - 10 reps Standing Hip Abduction with Counter Support - 1 x  daily - 7 x weekly - 2 sets - 10 reps      ASSESSMENT:   CLINICAL IMPRESSION:   Increased resistance , difficulty and time as noted.  Continued focus on LLE strength, coordination and TKE to improve gait and mobility.  Vcs and Tcs provided for postural correction which improves patient cadence and overall gait pattern.  At end of session, patient able to ambulate 148ft w/o AD showing good cadence and TKE on L w/o foot drop or scuff and upright posture  REHAB  POTENTIAL: Good   CLINICAL DECISION MAKING: Stable/uncomplicated   EVALUATION COMPLEXITY: Low     GOALS: Goals reviewed with patient? Yes   SHORT TERM GOALS:  STG Name Target Date Goal status  1 Patient will undergo further assessment of endurance with 2MWT/6MWT and LTG to be set as applicable Baseline: TBA 02/03/21 6 MWT Completed 01/13/21     LONG TERM GOALS:   LTG Name Target Date Goal status  1 Patient will improve 5x sit <> stand to </= 12 seconds without UE support to demo improved balance and functional strength Comments:  03/03/21 02/01/21 5x STS w/o UE assist 15.1s  2 Pt will demo gait speed increase by 0.10 m/s to improve functional ambulation. Comments:  03/03/21 INITIAL  3 LTG to be set for 2MWT/6MWT as appropriate Comments: TBA 03/03/21 01/13/21 Goal is 1020ft  4 Pt will be independent with balance/strength HEP  Comments: HEP established 03/03/21 INITIAL    PLAN: PT FREQUENCY: 1x/week   PT DURATION: 8 weeks   PLANNED INTERVENTIONS: Therapeutic exercises, Therapeutic activity, Neuro Muscular re-education, Balance training, Gait training, Patient/Family education, Joint mobilization, Stair training, Orthotic/Fit training, Electrical stimulation, Cryotherapy and Moist heat   PLAN FOR NEXT SESSION: Continue LLE functional strengthening and focus on TKE. Continue gait training without AD. Balance and core strengthening emphasizing SLS L   Hildred Laser, PT 02/16/2021, 3:34 PM    [facility] [address] [place], [place], [address] Phone: [phone]   Fax:  [phone]  Patient name: Mark Bass MRN: [id number] DOB: February 22, 1961

## 2021-02-23 ENCOUNTER — Encounter: Payer: Self-pay | Admitting: Occupational Therapy

## 2021-02-23 ENCOUNTER — Ambulatory Visit: Payer: 59

## 2021-02-23 ENCOUNTER — Other Ambulatory Visit: Payer: Self-pay

## 2021-02-23 ENCOUNTER — Ambulatory Visit: Payer: 59 | Admitting: Occupational Therapy

## 2021-02-23 DIAGNOSIS — M6281 Muscle weakness (generalized): Secondary | ICD-10-CM

## 2021-02-23 DIAGNOSIS — R278 Other lack of coordination: Secondary | ICD-10-CM

## 2021-02-23 DIAGNOSIS — R41840 Attention and concentration deficit: Secondary | ICD-10-CM

## 2021-02-23 DIAGNOSIS — I69854 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: Secondary | ICD-10-CM

## 2021-02-23 DIAGNOSIS — R2681 Unsteadiness on feet: Secondary | ICD-10-CM

## 2021-02-23 DIAGNOSIS — R41844 Frontal lobe and executive function deficit: Secondary | ICD-10-CM

## 2021-02-23 DIAGNOSIS — R2689 Other abnormalities of gait and mobility: Secondary | ICD-10-CM

## 2021-02-23 NOTE — Therapy (Signed)
[facility] [address] [place], [place], [address] Phone: [phone]   Fax:  [phone]  Occupational Therapy Evaluation  Patient Details  Name: Mark Bass MRN: [id number] Date of Birth: [birth year] Referring Provider (OT): Ihor Austin, NP   Encounter Date: 02/23/2021   OT End of Session - 02/23/21 1613     Visit Number 1    Number of Visits 13    Date for OT Re-Evaluation 05/18/21    Authorization Type UHC    Authorization Time Period 30 visit limit (PT/OT)    OT Start Time 1530    OT Stop Time 1610    OT Time Calculation (min) 40 min    Activity Tolerance Patient tolerated treatment well    Behavior During Therapy [facility] for tasks assessed/performed             Past Medical History:  Diagnosis Date   Hypertension     Past Surgical History:  Procedure Laterality Date   NECK SURGERY      There were no vitals filed for this visit.   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 Pt reports concerns with walking hand weakness and difficulty holding things (LUE), and decreased appetite.    Patient is accompanied by: Interpreter    Limitations [place] Interpreter Needed. Fall Risk. Sensory Impairment    Patient Stated Goals "I don't know"    Currently in Pain? No/denies               [facility] OT Assessment - 02/23/21 1539       Assessment   Medical Diagnosis Left Hemiparesis    Referring Provider (OT) Ihor Austin, NP    Onset Date/Surgical Date 10/10/20    Hand Dominance Right      Precautions   Precautions Fall      Home  Environment   Family/patient expects to be discharged to: Private residence    Living Arrangements Spouse/significant other   2 children (outside of home), no pets   Type of Home House    Home Layout One level    Bathroom Shower/Tub  Tub/Shower unit    Home Equipment Tub bench;Cane -quad      Prior Function   Level of Independence Independent    Vocation Full time employment    Research officer, political party    Leisure soccer      ADL   Eating/Feeding Modified independent   increased time for tougher items   Grooming Modified independent    Upper Body Bathing Modified independent   spouse is present just in case needs something   Lower Body Bathing Modified independent    Upper Body Dressing Independent    Lower Body Dressing Increased time    Toilet Transfer Modified independent   reports a little difficult   Toileting - Clothing Manipulation Modified independent;Increased time    Tub/Shower Transfer Min guard      IADL   Prior Level of Function Shopping independent    Shopping Needs to be accompanied on any shopping trip    Prior Level of Function Light Housekeeping independent    Light Housekeeping Needs help with all home maintenance tasks    Prior Level of Function Meal Prep independent    Meal Prep Needs to have meals prepared and served;Does not utilize stove or oven  Prior Level of Function 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      Cognition   Overall Cognitive Status No family/caregiver present to determine baseline cognitive functioning    Cognition Comments difficulty noted with Trail Making Test A      Observation/Other Assessments   Focus on Therapeutic Outcomes (FOTO)  N/A      Sensation   Light Touch Impaired Detail    Light Touch Impaired Details Impaired LUE      Coordination   9 Hole Peg Test  Left;Right    Right 9 Hole Peg Test 33.78s    Left 9 Hole Peg Test 39.09s      ROM / Strength   AROM / PROM / Strength AROM;Strength      AROM   Overall AROM  Within functional limits for tasks performed      Strength   Overall Strength Deficits    Overall Strength Comments LUE grossly 3+/5, RUE grossly 5/5      Hand Function   Right Hand Gross Grasp Functional    Right Hand Grip (lbs) 73.4    Left Hand Gross Grasp Functional    Left Hand Grip (lbs) 46.5                               OT Short Term Goals - 02/23/21 1725       OT SHORT TERM GOAL #1   Title Pt will be independent with HEP (LUE proximal strengthening, grip strengthening)    Time 4    Period Weeks    Status New    Target Date 03/23/21      OT SHORT TERM GOAL #2   Title Pt will increase grip strength in LUE by 5 lbs or greater for increasing functional use of LUE.    Baseline R 73.4 lbs, L 46.5 lbs    Time 4    Period Weeks    Status New      OT SHORT TERM GOAL #3   Title Pt will verbalize understanding of sensory strategies for increasing safety and awareness to LUE.    Time 4    Period Weeks    Status New      OT SHORT TERM GOAL #4   Title Pt will perform environmental scanning with 90% accuracy in a moderate distracting environment.    Time 4    Period Weeks    Status New               OT Long Term Goals - 02/23/21 1727       OT LONG TERM GOAL #1   Title Pt will be independent with updated HEPs    Time 12    Period Weeks    Status New    Target Date 05/18/21      OT LONG TERM GOAL #2   Title Pt will perform physical and cognitive tasks simultaneously with 90% accuracy or greater    Time 12    Period Weeks    Status  New      OT LONG TERM GOAL #3   Title Pt will increase grip strength in LUE to 56 lbs or greater for increase in functional use of LUE    Baseline R 73.4 lbs, L 46.5 lbs    Time 12    Period Weeks    Status New      OT LONG TERM GOAL #4    Title Pt will verbalize understanding of return to driving recommendations    Time 12    Period Weeks    Status New      OT LONG TERM GOAL #5   Title Pt will perform simple warm meal prep and light housekeeping with mod I and good safety awareness    Baseline not currently doing    Time 12    Period Weeks    Status New                   Plan - 02/23/21 1604     Clinical Impression Statement Pt is a 60 year old male that presents to Neuro OPOT s/p CVA 10/10/20 with residual LUE weakness, sensory deficits, decreased coordination, unsteadiness on feet and deficits with attention and problem solving impeding independence with ADLs and IADLs. Skilled occupational therapy is recommended to target listed areas of deficit and increase independence with ADLs and IADLs and decrease caregiver burden upon discharge.    OT Occupational Profile and History Problem Focused Assessment - Including review of records relating to presenting problem    Occupational performance deficits (Please refer to evaluation for details): IADL's;ADL's    Body Structure / Function / Physical Skills ADL;Coordination;Decreased knowledge of precautions;FMC;Decreased knowledge of use of DME;Dexterity;GMC;UE functional use;Strength;ROM;IADL    Cognitive Skills Attention;Problem Solve;Sequencing;Understand    Rehab Potential Good    Clinical Decision Making Limited treatment options, no task modification necessary    Comorbidities Affecting Occupational Performance: None    Modification or Assistance to Complete Evaluation  No modification of tasks or assist necessary to complete eval    OT Frequency 1x / week    OT Duration 12 weeks    OT Treatment/Interventions Self-care/ADL training;DME and/or AE instruction;Therapeutic activities;Therapeutic exercise;Cognitive remediation/compensation;Neuromuscular education;Patient/family education    Plan HEP for shoulder (seated cane, yellow theraband?), theraputty, review goals,  sensory strategies for safety    Consulted and Agree with Plan of Care Patient             Patient will benefit from skilled therapeutic intervention in order to improve the following deficits and impairments:   Body Structure / Function / Physical Skills: ADL, Coordination, Decreased knowledge of precautions, FMC, Decreased knowledge of use of DME, Dexterity, GMC, UE functional use, Strength, ROM, IADL Cognitive Skills: Attention, Problem Solve, Sequencing, Understand     Visit Diagnosis: 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) - Plan: Ot plan of care cert/re-cert  Other lack of coordination - Plan: Ot plan of care cert/re-cert  Muscle weakness (generalized) - Plan: Ot plan of care cert/re-cert  Attention and concentration deficit - Plan: Ot plan of care cert/re-cert  Frontal lobe and executive function deficit - Plan: Ot plan of care cert/re-cert  Unsteadiness on feet - Plan: Ot plan of care cert/re-cert  Other abnormalities of gait and mobility - Plan: Ot plan of care cert/re-cert    Problem List Patient Active Problem List   Diagnosis Date Noted   Stroke, 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HCC) 10/10/2020   COVID-19 virus infection 01/02/2019  Pneumonia due to COVID-19 virus 01/01/2019   Elevated blood pressure reading without diagnosis of hypertension 01/01/2019   Dehydration 01/01/2019   Sepsis (HCC) 01/01/2019    Junious Dresser MOT, OTR/L  02/23/2021, 5:38 PM  [facility] [address] [place], [place], [address] Phone: [phone]   Fax:  [phone]  Name: Mark Bass MRN: [id number] Date of Birth: 1960/09/21

## 2021-02-23 NOTE — Therapy (Signed)
OUTPATIENT PHYSICAL THERAPY TREATMENT/RE-CERTIFICATION NOTE   Patient Name: Mark Bass MRN: [id number] DOB:Dec 24, 1960, 60 y.o., male Today's Date: 02/23/2021  PCP: Grayce Sessions, NP REFERRING PROVIDER: Grayce Sessions, NP   PT End of Session - 02/23/21 1455     Visit Number 8    Number of Visits 10    Date for PT Re-Evaluation 03/03/21    Authorization Type UHC (30 VL between PT/OT)    Progress Note Due on Visit 8    PT Start Time 1445    PT Stop Time 1530    PT Time Calculation (min) 45 min    Equipment Utilized During Treatment Gait belt    Activity Tolerance Patient tolerated treatment well    Behavior During Therapy WFL for tasks assessed/performed              Past Medical History:  Diagnosis Date   Hypertension    Past Surgical History:  Procedure Laterality Date   NECK SURGERY     Patient Active Problem List   Diagnosis Date Noted   Stroke, 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   PAIN:  Are you having pain? No Denies Pain, but reports continued numbness.    OBJECTIVE/TREATMENT:  02/23/21  15x STS arms crossed with OH reach when standing  Standing with foam roll for support, Heel raises, toe raises, abd and marching 15x, alt. where appropriate, 3#  Scifit L2 8 min. Arms 6 seat  12  Seated core exercises of hip and shoulder tosses, chops and Vs for 10 reps ea. 3.3# ball  Standing L TKE with green band, 15x at 3 angles of pull  Seated LAQs and marching, 3#, alternating with latissimus press, 15reps  Ambulation 158ft w/AD encouraging TKE on heel strike  02/16/21  15x STS arms crossed with OH reach when standing  Standing with foam roll for support, Heel raises, toe raises, abd and marching 15x, alt. where appropriate, 2#  Scifit L1.5 8 min. Arms 6  Seated core exercises of hip and shoulder tosses, chops and Vs for 10 reps ea. 2.2# ball  Standing L TKE with green band, 15x at 3 angles of pull  Seated LAQs and marching, 2#, alternating with latissimus press, 15reps  Ambulation 18ft w/ AD encouraging TKE  on heel strike   02/09/21   15x STS arms crossed with OH reach when standing  Standing with foam roll for support, Heel raises, toe raises, abd and marching 15x, alt. where appropriate  Scifit L1 8 min. Arms 6  FAQs and marching with latissimus pressdown, 15x alternating pattern  Seated core exercises of hip and shoulder tosses, chops and Vs for 10 reps ea. 1.1# ball  Standing TKE with red band, 15x at 3 angles of pull      Reviewed HEP and progressed to patient's tolerance. Completed all of the following exercises during today's session, intermittent rest breaks required due to fatigue. Handout provided  and reviewed with patient: Sit to Stand with Arms Crossed - 2 x daily - 7 x weekly - 2 sets - 10 reps Heel Toe Raises with Counter Support - 1 x daily - 7 x weekly - 2 sets - 10 reps Standing Hip Extension with Counter Support - 1 x daily - 7 x weekly - 2 sets - 10 reps Standing Hip Abduction with Counter Support - 1 x daily - 7 x weekly - 2 sets - 10 reps   PATIENT EDUCATION: Education details: Occupational Therapy Referral and Benefits of OT Eval;  Berg Balance Score; Updated HEP Person educated: Patient Education method: Explanation and  Demonstration Education comprehension: verbalized understanding, verbal cues required and needs further education     HOME EXERCISE PROGRAM: Access Code: [email or web address] URL: [email or web address] Date: 01/06/2021 Prepared by: Jethro Bastos  Exercises Sit to Stand with Arms Crossed - 2 x daily - 7 x weekly - 2 sets - 10 reps Heel Toe Raises with Counter Support - 1 x daily - 7 x weekly - 2 sets - 10 reps Standing Hip Extension with Counter Support - 1 x daily - 7 x weekly - 2 sets - 10 reps Standing Hip Abduction with Counter Support - 1 x daily - 7 x weekly - 2 sets - 10 reps      ASSESSMENT:   CLINICAL IMPRESSION:   Increased resistance , difficulty and time as noted.  Continued focus on LLE strength, coordination and TKE to improve gait and mobility.  Emphasized TKE with t-band exercises and gait.  Showing improved TKE when ambulating, has SBQC for use today, adjusted to patient height.  Improved stability with SLS tasks on L.  REHAB POTENTIAL: Good   CLINICAL DECISION MAKING: Stable/uncomplicated   EVALUATION COMPLEXITY: Low     GOALS: Goals reviewed with patient? Yes   SHORT TERM GOALS:  STG Name Target Date Goal status  1 Patient will undergo further assessment of endurance with 2MWT/6MWT and LTG to be set as applicable Baseline: TBA 02/03/21 6 MWT Completed 01/13/21     LONG TERM GOALS:   LTG Name Target Date Goal status  1 Patient will improve 5x sit <> stand to </= 12 seconds without UE support to demo improved balance and functional strength Comments:  03/03/21 02/01/21 5x STS w/o UE assist 15.1s  2 Pt will demo gait speed increase by 0.10 m/s to improve functional ambulation. Comments:  03/03/21 INITIAL  3 LTG to be set for 2MWT/6MWT as appropriate Comments: TBA 03/03/21 01/13/21 Goal is 1059ft  4 Pt will be independent with balance/strength HEP  Comments: HEP established 03/03/21 INITIAL    PLAN: PT FREQUENCY: 1x/week   PT DURATION: 8 weeks   PLANNED  INTERVENTIONS: Therapeutic exercises, Therapeutic activity, Neuro Muscular re-education, Balance training, Gait training, Patient/Family education, Joint mobilization, Stair training, Orthotic/Fit training, Electrical stimulation, Cryotherapy and Moist heat   PLAN FOR NEXT SESSION: Continue LLE functional strengthening and focus on TKE. Continue gait training without AD. Balance and core strengthening emphasizing SLS L   Hildred Laser, PT 02/23/2021, 2:57 PM    [facility] [address] [place], [place], [address] Phone: [phone]   Fax:  [phone]  Patient name: Mark Bass MRN: [id number] DOB: 13-Dec-1960

## 2021-03-02 ENCOUNTER — Ambulatory Visit: Payer: Self-pay | Admitting: Adult Health

## 2021-03-02 ENCOUNTER — Other Ambulatory Visit: Payer: Self-pay

## 2021-03-02 ENCOUNTER — Encounter: Payer: Self-pay | Admitting: Adult Health

## 2021-03-02 VITALS — BP 132/76 | HR 64 | Ht 61.0 in | Wt 127.0 lb

## 2021-03-02 DIAGNOSIS — I610 Nontraumatic intracerebral hemorrhage in hemisphere, subcortical: Secondary | ICD-10-CM

## 2021-03-02 DIAGNOSIS — E785 Hyperlipidemia, unspecified: Secondary | ICD-10-CM

## 2021-03-02 DIAGNOSIS — G8194 Hemiplegia, unspecified affecting left nondominant side: Secondary | ICD-10-CM

## 2021-03-02 DIAGNOSIS — I10 Essential (primary) hypertension: Secondary | ICD-10-CM

## 2021-03-02 MED ORDER — ASPIRIN EC 81 MG PO TBEC: 81.0000 mg | DELAYED_RELEASE_TABLET | Freq: Every day | ORAL | 11 refills | Status: DC

## 2021-03-02 NOTE — Progress Notes (Signed)
I agree with the above plan 

## 2021-03-02 NOTE — Patient Instructions (Signed)
Continue aspirin 81 mg daily  and atorvastatin 40mg  daily  for secondary stroke prevention  Continue to follow up with PCP regarding cholesterol and blood pressure management  Maintain strict control of hypertension with blood pressure goal below 130/90 and cholesterol with LDL cholesterol (bad cholesterol) goal below 70 mg/dL.   Continue working with therapies for hopeful ongoing recovery    Followup in the future with me in 6 months or call earlier if needed       Thank you for coming to see at [facility]. I hope we have been able to provide you high quality care today.  You may receive a patient satisfaction survey over the next few weeks. We would appreciate your feedback and comments so that we may continue to improve ourselves and the health of our patients.

## 2021-03-02 NOTE — Progress Notes (Signed)
[facility] [address] [place]. [place] [address] [phone]       STROKE FOLLOW UP NOTE  [name] Date of Birth:  1960-09-01 Medical Record Number:  [id number]    Primary neurologist: Dr. Pearlean Brownie Reason for Referral:  stroke follow up    SUBJECTIVE:   CHIEF COMPLAINT:  Chief Complaint  Patient presents with   Basal ganglia hemorrhage    Rm 2, 3 month FU, interpreter- [place]  "on left side I have numbness, same as before; my left leg is weaker, in PT now"      HPI:   Today, 03/02/2021, Mark Bass returns for 1-month stroke follow-up accompanied by [facility] interpreter.  Stable since prior visit without new stroke/TIA symptoms and reports residual left-sided weakness and intermittent numbness currently working with PT/OT. Does report some improvement since prior visit.  Continues to ambulate with a cane and denies any recent falls.  Reports compliance on atorvastatin without associated side effects.  Blood pressure today 132/76.  No further concerns at this time.    History provided for reference purposes only Update 11/12/2020 JM Mark Bass is being seen for hospital follow-up accompanied by his son, Mark Bass, and a [place] interpreter   Stable since discharge without new stroke/TIA symptoms Reports residual left arm and leg weakness and numbness - has been slowly improving  Initial PT evaluation today - has not yet been called to schedule OT Previously working as a Production designer, theatre/television/film in a warehouse - currently in the process of applying for short term disability He is living his wife and 2 children at home - able to maintain ADLs independently but does have supervision  Compliant on Lipitor - denies associated side effects Blood pressure today 126/87 - monitors BP at home typically 130s  No further concerns at this time  Stroke admission 10/10/2020 Mark Bass is a 60 y.o. male with history of HTN and medication non-compliance. Presented to  [facility] ED on 10/10/2020 after awakening at 4am to go to the bathroom and became dizzy with headache as well as difficulty standing and left facial droop.  Personally reviewed hospitalization pertinent progress notes, lab work and imaging with summary provided.  Evaluated by Dr. 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  BP stabilized on Cleviprex and started amlodipine, losartan and metoprolol.  LDL 110 -initiated atorvastatin 40 mg daily at discharge.  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  Evaluated by therapies initially recommending CIR but pt and wife preferred discharge directly home with [facility] or OP therapies  ICH: Right BG, likely due to HTN etiology.  CT head - right BG ICH, ~ 10cc CTA head & neck: severe athro disease, but no underlying AVM or etiology for bleed. No Spot sign.  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 No underlying mass. EF 55-60% LDL 110 HgbA1c 6.6 UDS negative VTE prophylaxis - SCDs No antithrombotics PTA, now not on antithrombotics due to ICH Therapy recommendation - CIR --> HH or OP PT/OT Deposition - home     ROS:   14 system review of systems performed and negative with exception of those listed in HPI  PMH:  Past Medical History:  Diagnosis Date   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HCC)    Hypertension     PSH:  Past Surgical History:  Procedure  Laterality Date   NECK SURGERY      Social History:  Social History   Socioeconomic History   Marital status: Single    Spouse name: Not on file   Number of children: Not on file   Years of education: Not on file   Highest education level: Not on file  Occupational History   Not on file  Tobacco Use   Smoking status: Former   Smokeless tobacco: Never   Tobacco comments:    Patient smoked in his early 37s.   Substance and Sexual Activity   Alcohol use: Not Currently   Drug use: Never   Sexual activity: Not on file  Other Topics Concern   Not on file  Social History Narrative   Not on file   Social Determinants of Health   Financial Resource Strain: Not on file  Food Insecurity: Not on file  Transportation Needs: Not on file  Physical Activity: Not on file  Stress: Not on file  Social Connections: Not on file  Intimate Partner Violence: Not on file    Family History:  Family History  Family history unknown: Yes    Medications:   Current Outpatient Medications on File Prior to Visit  Medication Sig Dispense Refill   amLODipine (NORVASC) 10 MG tablet Take 1 tablet (10 mg total) by mouth daily. 90 tablet 1   atorvastatin (LIPITOR) 40 MG tablet Take 1 tablet (40 mg total) by mouth daily. 90 tablet 1   hydrochlorothiazide (HYDRODIURIL) 25 MG tablet Take 1 tablet (25 mg total) by mouth daily. 90 tablet 1   losartan (COZAAR) 50 MG tablet Take 1 tablet (50 mg total) by mouth 2 (two) times daily. 180 tablet 1   metoprolol tartrate (LOPRESSOR) 25 MG tablet Take 1 tablet (25 mg total) by mouth 2 (two) times daily. 180 tablet 1   Current Facility-Administered Medications on File Prior to Visit  Medication Dose Route Frequency Provider Last Rate Last Admin   cloNIDine (CATAPRES) tablet 0.2 mg  0.2 mg Oral Daily Gwinda Passe P, NP   0.2 mg at 01/19/21 1126    Allergies:  No Known Allergies    OBJECTIVE:  Physical Exam  Vitals:   03/02/21 1257  BP: 132/76  Pulse: 64  Weight: 127 lb (57.6 kg)  Height: 5\' 1"  (1.549 m)    Body mass index is 24 kg/m. No results found.  General: well developed, well nourished,  pleasant middle-aged male, seated, in no evident distress Head: head normocephalic and atraumatic.   Neck: supple with no carotid or supraclavicular bruits Cardiovascular: regular rate and rhythm, no murmurs Musculoskeletal: no deformity Skin:  no  rash/petichiae Vascular:  Normal pulses all extremities   Neurologic Exam Mental Status: Awake and fully alert.  Primarily speaking - denies speech or language difficulties.  Oriented to place and time. Recent and remote memory intact. Attention span, concentration and fund of knowledge appropriate. Mood and affect appropriate.  Cranial Nerves: Pupils equal, briskly reactive to light. Extraocular movements full without nystagmus. Visual fields full to confrontation. Hearing intact. Facial sensation intact. Face, tongue, palate moves normally and symmetrically.  Motor: Normal bulk, tone and strength right upper and lower extremity.  LUE: 4+-5-/5 with slighly increased tone and mildly decreased hand dexterity; LLE: 4+-5/5 with increased tone left ankle Sensory.: decresaed light touch sensation left side compared to right side Coordination: Rapid alternating movements normal in all extremities except slightly decreased left hand. Finger-to-nose and heel-to-shin performed accurately on right side. Gait  and Station: Arises from chair without difficulty. Stance is normal. Gait demonstrates normal stride length and balance with use of cane. Tandem walk and heel toe without difficulty.  Romberg negative Reflexes: 2+ LUE and LLE; 1+ RUE and RLE. Toes downgoing.        ASSESSMENT: Mark Bass is a 60 y.o. year old male presented with dizziness, difficulty standing, left facial droop and headache on 10/10/2020 with stroke work-up revealing right BG ICH likely hypertensive etiology.  Vascular risk factors include HTN with medication noncompliance, HLD, severe arthrosclerotic disease, old hypertensive hemorrhages on imaging and prior tobacco use.      PLAN:  R BG ICH :  Residual deficit: left hemiparesis and sensory impairment. Continue working with PT/OT for hopeful ongoing recovery  Recommend starting aspirin 81 mg daily in setting of recent stroke and severe arthrosclerotic disease and  continue atorvastatin for secondary stroke prevention.   Discussed secondary stroke prevention measures and importance of close PCP follow up for aggressive stroke risk factor management  HTN: BP goal <130/90.  Stable on current regimen per PCP -discussed importance of medication compliance and routine monitoring at home HLD: LDL goal <70.  Most recent LDL 110 - on atorvastatin 40 mg daily -request repeat lab work at follow-up visit with PCP in September as patient currently not fasting    Follow up in 6 months or call earlier if needed   CC:  [facility] provider: Dr. Pearlean Brownie PCP: Grayce Sessions, NP    I spent 35 minutes of face-to-face and non-face-to-face time with patient assisted by interpreter.  This included previsit chart review, lab review, study review, electronic health record documentation, patient education regarding stroke and etiology, secondary stroke measures and aggressive stroke risk factor management, residual deficits and possible further recovery, and answered all other questions to patient satisfaction  Ihor Austin, AGNP-BC  [facility] [address] [place], [place] [address]  Phone [phone] Fax [phone] Note: This document was prepared with digital dictation and possible smart phrase technology. Any transcriptional errors that result from this process are unintentional.

## 2021-03-03 ENCOUNTER — Other Ambulatory Visit: Payer: Self-pay

## 2021-03-03 ENCOUNTER — Ambulatory Visit: Payer: 59

## 2021-03-03 ENCOUNTER — Ambulatory Visit: Payer: Self-pay | Admitting: Occupational Therapy

## 2021-03-03 ENCOUNTER — Encounter: Payer: Self-pay | Admitting: Occupational Therapy

## 2021-03-03 DIAGNOSIS — R278 Other lack of coordination: Secondary | ICD-10-CM

## 2021-03-03 DIAGNOSIS — R41844 Frontal lobe and executive function deficit: Secondary | ICD-10-CM

## 2021-03-03 DIAGNOSIS — I69854 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: Secondary | ICD-10-CM

## 2021-03-03 DIAGNOSIS — R41840 Attention and concentration deficit: Secondary | ICD-10-CM

## 2021-03-03 DIAGNOSIS — R2689 Other abnormalities of gait and mobility: Secondary | ICD-10-CM

## 2021-03-03 DIAGNOSIS — M6281 Muscle weakness (generalized): Secondary | ICD-10-CM

## 2021-03-03 DIAGNOSIS — R2681 Unsteadiness on feet: Secondary | ICD-10-CM

## 2021-03-03 NOTE — Patient Instructions (Addendum)
Access Code: [email or web address] URL: [email or web address] Date: 03/03/2021 Prepared by: Kallie Edward  Exercises Putty Squeezes - 1 x daily - 7 x weekly - 3 sets - 10 reps Rolling Putty on Table - 1 x daily - 7 x weekly - 3 sets - 10 reps Thumb Opposition with Putty - 1 x daily - 7 x weekly - 3 sets - 10 reps Finger Extension with Putty - 1 x daily - 7 x weekly - 3 sets - 10 reps Finger Lumbricals with Putty - 1 x daily - 7 x weekly - 3 sets - 10 reps  Access Code: [email or web address] URL: [email or web address] Date: 03/03/2021 Prepared by: Kallie Edward  Exercises Seated Shoulder Flexion with Dowel to 90 - 1 x daily - 7 x weekly - 3 sets - 10 reps Seated Overhead Press with Bar - 1 x daily - 7 x weekly - 3 sets - 10 reps Seated Bicep Curls with Bar - 1 x daily - 7 x weekly - 3 sets - 10 reps Seated Dowel Chest Press - 1 x daily - 7 x weekly - 3 sets - 10 reps Seated Dowel Rowing - 1 x daily - 7 x weekly - 3 sets - 10 reps Seated Dowel Horizontal Abduction/Adduction - 1 x daily - 7 x weekly - 3 sets - 10 reps

## 2021-03-03 NOTE — Therapy (Signed)
[facility] [address] [place], [place], [address] Phone: [phone]   Fax:  [phone]  Occupational Therapy Treatment  Patient Details  Name: Mark Bass MRN: [id number] Date of Birth: 30-Aug-1960 Referring Provider (OT): Ihor Austin, NP   Encounter Date: 03/03/2021   OT End of Session - 03/03/21 1533     Visit Number 2    Number of Visits 13    Date for OT Re-Evaluation 05/18/21    Authorization Type UHC    Authorization Time Period 30 visit limit (PT/OT)    OT Start Time 1533    OT Stop Time 1601   needed to end early d/t scheduling conflict   OT Time Calculation (min) 28 min    Activity Tolerance Patient tolerated treatment well    Behavior During Therapy [facility] for tasks assessed/performed             Past Medical History:  Diagnosis Date   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HCC)    Hypertension     Past Surgical History:  Procedure Laterality Date   NECK SURGERY      There were no vitals filed for this visit.   Subjective Assessment - 03/03/21 1532     Subjective  Pt reports numbness in LLE but no other pain.    Patient is accompanied by: Interpreter    Limitations [place] Interpreter Needed. Fall Risk. Sensory Impairment    Patient Stated Goals "I don't know"    Currently in Pain? No/denies                          OT Treatments/Exercises (OP) - 03/03/21 1537       Exercises   Exercises Theraputty;Hand      Hand Exercises   Other Hand Exercises Hand Gripper level 3 with black spring with LUE with min drops and min difficulty      Theraputty   Theraputty - Flatten issued green theraputty - see pt instructions                    OT Education - 03/03/21 1538     Education Details Issued green theraputty, seated dowel - see pt instructions    Person(s) Educated Patient    Methods Explanation;Demonstration;Handout    Comprehension Returned  demonstration;Verbalized understanding              OT Short Term Goals - 03/03/21 1539       OT SHORT TERM GOAL #1   Title Pt will be independent with HEP (LUE proximal strengthening, grip strengthening)    Time 4    Period Weeks    Status On-going    Target Date 03/23/21      OT SHORT TERM GOAL #2   Title Pt will increase grip strength in LUE by 5 lbs or greater for increasing functional use of LUE.    Baseline R 73.4 lbs, L 46.5 lbs    Time 4    Period Weeks    Status On-going      OT SHORT TERM GOAL #3   Title Pt will verbalize understanding of sensory strategies for increasing safety and awareness to LUE.    Time 4    Period Weeks    Status New      OT SHORT TERM GOAL #4   Title Pt will perform environmental scanning with 90% accuracy in a moderate distracting environment.    Time 4  Period Weeks    Status New               OT Long Term Goals - 02/23/21 1727       OT LONG TERM GOAL #1   Title Pt will be independent with updated HEPs    Time 12    Period Weeks    Status New    Target Date 05/18/21      OT LONG TERM GOAL #2   Title Pt will perform physical and cognitive tasks simultaneously with 90% accuracy or greater    Time 12    Period Weeks    Status New      OT LONG TERM GOAL #3   Title Pt will increase grip strength in LUE to 56 lbs or greater for increase in functional use of LUE    Baseline R 73.4 lbs, L 46.5 lbs    Time 12    Period Weeks    Status New      OT LONG TERM GOAL #4   Title Pt will verbalize understanding of return to driving recommendations    Time 12    Period Weeks    Status New      OT LONG TERM GOAL #5   Title Pt will perform simple warm meal prep and light housekeeping with mod I and good safety awareness    Baseline not currently doing    Time 12    Period Weeks    Status New                   Plan - 03/03/21 1536     Clinical Impression Statement Pt in agreement with STGs and LTGs. Continue  to progress towards goals.    OT Occupational Profile and History Problem Focused Assessment - Including review of records relating to presenting problem    Occupational performance deficits (Please refer to evaluation for details): IADL's;ADL's    Body Structure / Function / Physical Skills ADL;Coordination;Decreased knowledge of precautions;FMC;Decreased knowledge of use of DME;Dexterity;GMC;UE functional use;Strength;ROM;IADL    Cognitive Skills Attention;Problem Solve;Sequencing;Understand    Rehab Potential Good    Clinical Decision Making Limited treatment options, no task modification necessary    Comorbidities Affecting Occupational Performance: None    Modification or Assistance to Complete Evaluation  No modification of tasks or assist necessary to complete eval    OT Frequency 1x / week    OT Duration 12 weeks    OT Treatment/Interventions Self-care/ADL training;DME and/or AE instruction;Therapeutic activities;Therapeutic exercise;Cognitive remediation/compensation;Neuromuscular education;Patient/family education    Plan possibly upgrade proximal strength to theraband, check to see how theraputty is going, sensory strategies, arm bike    Consulted and Agree with Plan of Care Patient             Patient will benefit from skilled therapeutic intervention in order to improve the following deficits and impairments:   Body Structure / Function / Physical Skills: ADL, Coordination, Decreased knowledge of precautions, FMC, Decreased knowledge of use of DME, Dexterity, GMC, UE functional use, Strength, ROM, IADL Cognitive Skills: Attention, Problem Solve, Sequencing, Understand     Visit Diagnosis: Other lack of coordination  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 [place], [place], [address] Phone: [phone]   Fax:  [phone]  Name: Mark Bass MRN: [id number] Date of Birth: 13-Dec-1960

## 2021-03-03 NOTE — Therapy (Signed)
OUTPATIENT PHYSICAL THERAPY TREATMENT/RE-CERTIFICATION NOTE   Patient Name: Mark Bass MRN: [id number] DOB:21-Feb-1961, 60 y.o., male Today's Date: 03/03/2021  PCP: Kerin Perna, NP REFERRING PROVIDER: Kerin Perna, NP   PT End of Session - 03/03/21 1538     Visit Number 9    Number of Visits 15    Date for PT Re-Evaluation 04/14/21    Authorization Type UHC (30 VL between PT/OT); Recert 7/90/38 to 10/08/36 (1x/week for 6 more sessions)    Progress Note Due on Visit 15    PT Start Time 1450    PT Stop Time 1530    PT Time Calculation (min) 40 min    Equipment Utilized During Treatment Gait belt    Activity Tolerance Patient tolerated treatment well    Behavior During Therapy WFL for tasks assessed/performed               Past Medical History:  Diagnosis Date   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[place])    Hypertension    Past Surgical History:  Procedure Laterality Date   NECK SURGERY     Patient Active Problem List   Diagnosis Date Noted   Stroke, 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[place]) 10/10/2020   COVID-19 virus infection 01/02/2019   Pneumonia due to COVID-19 virus 01/01/2019   Elevated blood pressure reading without diagnosis of hypertension 01/01/2019   Dehydration 01/01/2019   Sepsis ([place]) 01/01/2019    REFERRING DIAG: I61.9 (ICD-10-CM) - Stroke, 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[place])  THERAPY DIAG:  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  PAIN:  Are you having pain? No Denies Pain, but reports continued numbness.    OBJECTIVE/TREATMENT:  03/03/21: Tennis ball massage in sitting to L foot: 3' Standing gastroc stretch off the  bottom edge of step: 3 x 30" R and L Sit to stand: 15 lbs KB: 2 x 10 from 16" plyobox Standing mini jumps: 2 x 10 Lateral ski jump holds: 20x 3' holds R and L Fast walking sprints: 8 x 30 feet with cues to arm swing, with 10 feet of ramp up/down. Backwards walking: 6 x 20 feet , cues for arm swings    02/23/21  15x STS arms crossed with OH reach when standing  Standing with foam roll for support, Heel raises, toe raises, abd and marching 15x, alt. where appropriate, 3#  Scifit L2 8 min. Arms 6 seat 12  Seated core exercises of hip and shoulder tosses, chops and Vs for 10 reps ea. 3.3# ball  Standing L TKE with green band, 15x at 3 angles of pull  Seated LAQs and marching, 3#, alternating with latissimus press, 15reps  Ambulation 174f w/AD encouraging TKE on heel strike  02/16/21  15x STS arms crossed with OH reach when standing  Standing with foam roll for support, Heel raises, toe raises, abd and marching 15x, alt. where appropriate, 2#  Scifit L1.5 8 min. Arms 6  Seated core exercises of hip and shoulder tosses, chops and Vs for 10 reps ea. 2.2# ball  Standing L TKE with green band, 15x at 3 angles of pull  Seated LAQs and marching, 2#, alternating with latissimus press, 15reps  Ambulation 1118fw/ AD encouraging TKE  on  heel strike   02/09/21   15x STS arms crossed with OH reach when standing  Standing with foam roll for support, Heel raises, toe raises, abd and marching 15x, alt. where appropriate  Scifit L1 8 min. Arms 6  FAQs and marching with latissimus pressdown, 15x alternating pattern  Seated core exercises of hip and shoulder tosses, chops and Vs for 10 reps ea. 1.1# ball  Standing TKE with red band, 15x at 3 angles of pull      Reviewed HEP and progressed to patient's tolerance. Completed all of the following exercises during today's session, intermittent rest breaks required due to fatigue. Handout provided and reviewed with patient: Sit to Stand with Arms Crossed -  2 x daily - 7 x weekly - 2 sets - 10 reps Heel Toe Raises with Counter Support - 1 x daily - 7 x weekly - 2 sets - 10 reps Standing Hip Extension with Counter Support - 1 x daily - 7 x weekly - 2 sets - 10 reps Standing Hip Abduction with Counter Support - 1 x daily - 7 x weekly - 2 sets - 10 reps   PATIENT EDUCATION: Education details: Occupational Therapy Referral and Benefits of OT Eval;  Berg Balance Score; Updated HEP Person educated: Patient Education method: Explanation and Demonstration Education comprehension: verbalized understanding, verbal cues required and needs further education     HOME EXERCISE PROGRAM: Access Code: [email or web address] URL: [email or web address] Date: 01/06/2021 Prepared by: Baldomero Lamy  Exercises Sit to Stand with Arms Crossed - 2 x daily - 7 x weekly - 2 sets - 10 reps Heel Toe Raises with Counter Support - 1 x daily - 7 x weekly - 2 sets - 10 reps Standing Hip Extension with Counter Support - 1 x daily - 7 x weekly - 2 sets - 10 reps Standing Hip Abduction with Counter Support - 1 x daily - 7 x weekly - 2 sets - 10 reps      ASSESSMENT:   CLINICAL IMPRESSION:   Patient has been seen for total of 9 sessions for gait and balance disorder since 11/12/20 to 03/03/21. Patient has met all the short term goals and progressing his long term goals. Pt still demonstrates decreased cadence and decreased walking endurance. Pt is currently using a straight cane for outdoor mobility. Patient will continue to benefit from skilled PT to improve functional strength, endurance, gait and balance.  REHAB POTENTIAL: Good   CLINICAL DECISION MAKING: Stable/uncomplicated   EVALUATION COMPLEXITY: Low     GOALS: Goals reviewed with patient? Yes   SHORT TERM GOALS:  STG Name Target Date Goal status  1 Patient will undergo further assessment of endurance with 2MWT/6MWT and LTG to be set as applicable Baseline: TBA 02/03/21 6 MWT Completed 01/13/21     LONG  TERM GOALS: Revised as of 03/03/21   LTG Name Target Date Goal status  1 Patient will improve 5x sit <> stand to </= 12 seconds without UE support to demo improved balance and functional strength Comments:  04/14/21  02/01/21 5x STS w/o UE assist 15.1s  2 Pt will demo gait speed increase by 0.10 m/s to improve functional ambulation. Comments:  04/14/21 On going  3 LTG to be set for 2MWT/6MWT as appropriate Comments: TBA 04/14/21 01/13/21 Goal is 1074f  4 Pt will be independent with balance/strength HEP  Comments: HEP established 03/03/21 Goal met 03/03/21    PLAN: PT FREQUENCY: 1x/week   PT DURATION: 6 weeks   PLANNED  INTERVENTIONS: Therapeutic exercises, Therapeutic activity, Neuro Muscular re-education, Balance training, Gait training, Patient/Family education, Joint mobilization, Stair training, Orthotic/Fit training, Electrical stimulation, Cryotherapy and Moist heat   PLAN FOR NEXT SESSION: Assess LTG next session   Kerrie Pleasure, PT 03/03/2021, 3:52 PM    [facility] [address] [place], [place], [address] Phone: [phone]   Fax:  [phone]  Patient name: Sameul Tagle MRN: [id number] DOB: 1961-06-17

## 2021-03-09 ENCOUNTER — Ambulatory Visit: Payer: Self-pay | Attending: Internal Medicine | Admitting: Occupational Therapy

## 2021-03-09 ENCOUNTER — Ambulatory Visit: Payer: Self-pay

## 2021-03-09 ENCOUNTER — Other Ambulatory Visit: Payer: Self-pay

## 2021-03-09 DIAGNOSIS — R2689 Other abnormalities of gait and mobility: Secondary | ICD-10-CM | POA: Insufficient documentation

## 2021-03-09 DIAGNOSIS — R2681 Unsteadiness on feet: Secondary | ICD-10-CM

## 2021-03-09 DIAGNOSIS — R41840 Attention and concentration deficit: Secondary | ICD-10-CM | POA: Insufficient documentation

## 2021-03-09 DIAGNOSIS — M6281 Muscle weakness (generalized): Secondary | ICD-10-CM | POA: Insufficient documentation

## 2021-03-09 DIAGNOSIS — I69854 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: Secondary | ICD-10-CM | POA: Insufficient documentation

## 2021-03-09 DIAGNOSIS — R278 Other lack of coordination: Secondary | ICD-10-CM | POA: Insufficient documentation

## 2021-03-09 DIAGNOSIS — R41844 Frontal lobe and executive function deficit: Secondary | ICD-10-CM | POA: Insufficient documentation

## 2021-03-09 NOTE — Therapy (Signed)
[facility] [address] [place], [place], [address] Phone: [phone]   Fax:  [phone]  Occupational Therapy Treatment  Patient Details  Name: Mark Bass MRN: [id number] Date of Birth: 1960/11/19 Referring Provider (OT): Frann Rider, NP   Encounter Date: 03/09/2021   OT End of Session - 03/09/21 1532     Visit Number 3    Number of Visits 13    Date for OT Re-Evaluation 05/18/21    Authorization Type UHC    Authorization Time Period 30 visit limit (PT/OT)    OT Start Time 1532    OT Stop Time 1613    OT Time Calculation (min) 41 min    Activity Tolerance Patient tolerated treatment well    Behavior During Therapy [facility] for tasks assessed/performed             Past Medical History:  Diagnosis Date   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   Subjective Assessment - 03/09/21 1532     Subjective  Pt denies any pain. "Nothing is new"    Patient is accompanied by: Interpreter    Limitations [place] Interpreter Needed. Fall Risk. Sensory Impairment    Patient Stated Goals "I don't know"    Currently in Pain? No/denies                          OT Treatments/Exercises (OP) - 03/09/21 1538       Exercises   Exercises Work Hardening   red theraband - see pt instructions     Work Hardening Exercises   UBE (Upper Arm Bike) x6 minutes forward and backward for reciprocal motion and conditioning. level 3      Visual/Perceptual Exercises   Scanning Environmental;Tabletop    Scanning - Environmental 9/16 - 56% accuracy with environmental scanning in mod distracting environment      Functional Reaching Activities   High Level reaching with resistance clothespins 1-8# with LUE and bringing back down from antenna for shoulder endurance and strengthening and grip  strengthening with LUe                    OT Education - 03/09/21 1537     Education Details red theraband Access Code: [email or web address]    Person(s) Educated Patient    Methods Explanation;Demonstration;Handout    Comprehension Returned demonstration;Verbalized understanding              OT Short Term Goals - 03/09/21 1546       OT SHORT TERM GOAL #1   Title Pt will be independent with HEP (LUE proximal strengthening, grip strengthening)    Time 4    Period Weeks    Status Achieved    Target Date 03/23/21      OT SHORT TERM GOAL #2   Title Pt will increase grip strength in LUE by 5 lbs or greater for increasing functional use of LUE.    Baseline R 73.4 lbs, L 46.5 lbs    Time 4    Period Weeks    Status Achieved   64.8 lbs with LUE 03/09/21     OT SHORT TERM GOAL #3   Title Pt will verbalize understanding of sensory strategies for increasing safety and awareness to LUE.  Time 4    Period Weeks    Status On-going      OT SHORT TERM GOAL #4   Title Pt will perform environmental scanning with 90% accuracy in a moderate distracting environment.    Time 4    Period Weeks    Status On-going   56% in mod distracting environment              OT Long Term Goals - 03/09/21 1546       OT LONG TERM GOAL #1   Title Pt will be independent with updated HEPs    Time 12    Period Weeks    Status New      OT LONG TERM GOAL #2   Title Pt will perform physical and cognitive tasks simultaneously with 90% accuracy or greater    Time 12    Period Weeks    Status New      OT LONG TERM GOAL #3   Title Pt will increase grip strength in LUE to 56 lbs or greater for increase in functional use of LUE    Baseline R 73.4 lbs, L 46.5 lbs    Time 12    Period Weeks    Status Achieved   LUE 64.8 lbs 03/09/21     OT LONG TERM GOAL #4   Title Pt will verbalize understanding of return to driving recommendations    Time 12    Period Weeks    Status New      OT LONG TERM  GOAL #5   Title Pt will perform simple warm meal prep and light housekeeping with mod I and good safety awareness    Baseline not currently doing    Time 12    Period Weeks    Status New                   Plan - 03/09/21 1559     Clinical Impression Statement Pt has met grip strength goals but had increased difficulty with environmental scanning.    OT Occupational Profile and History Problem Focused Assessment - Including review of records relating to presenting problem    Occupational performance deficits (Please refer to evaluation for details): IADL's;ADL's    Body Structure / Function / Physical Skills ADL;Coordination;Decreased knowledge of precautions;FMC;Decreased knowledge of use of DME;Dexterity;GMC;UE functional use;Strength;ROM;IADL    Cognitive Skills Attention;Problem Solve;Sequencing;Understand    Rehab Potential Good    Clinical Decision Making Limited treatment options, no task modification necessary    Comorbidities Affecting Occupational Performance: None    Modification or Assistance to Complete Evaluation  No modification of tasks or assist necessary to complete eval    OT Frequency 1x / week    OT Duration 12 weeks    OT Treatment/Interventions Self-care/ADL training;DME and/or AE instruction;Therapeutic activities;Therapeutic exercise;Cognitive remediation/compensation;Neuromuscular education;Patient/family education    Plan sensory strategies, arm bike, see how theraband is going, attention and environmental scanning    Consulted and Agree with Plan of Care Patient             Patient will benefit from skilled therapeutic intervention in order to improve the following deficits and impairments:   Body Structure / Function / Physical Skills: ADL, Coordination, Decreased knowledge of precautions, FMC, Decreased knowledge of use of DME, Dexterity, GMC, UE functional use, Strength, ROM, IADL Cognitive Skills: Attention, Problem Solve, Sequencing,  Understand     Visit Diagnosis: Other lack of coordination  Muscle weakness (generalized)  Attention and  concentration deficit  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)  Frontal lobe and executive function deficit  Unsteadiness on feet  Other abnormalities of gait and mobility    Problem List Patient Active Problem List   Diagnosis Date Noted   Stroke, 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

## 2021-03-09 NOTE — Therapy (Addendum)
OUTPATIENT PHYSICAL THERAPY TREATMENT/RE-CERTIFICATION NOTE   Patient Name: Mark Bass MRN: [id number] DOB:[birth year], 60 y.o., male Today's Date: 03/09/2021  PCP: Kerin Perna, NP REFERRING PROVIDER: Kerin Perna, NP   PT End of Session - 03/09/21 1451     Visit Number 10    Number of Visits 15    Date for PT Re-Evaluation 04/14/21    Authorization Type UHC (30 VL between PT/OT); Recert 12/09/86 to 03/10/79 (1x/week for 6 more sessions)    Progress Note Due on Visit 15    PT Start Time 1445    PT Stop Time 1530    PT Time Calculation (min) 45 min    Equipment Utilized During Treatment Gait belt    Activity Tolerance Patient tolerated treatment well    Behavior During Therapy WFL for tasks assessed/performed               Past Medical History:  Diagnosis Date   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[place])    Hypertension    Past Surgical History:  Procedure Laterality Date   NECK SURGERY     Patient Active Problem List   Diagnosis Date Noted   Stroke, 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[place]) 10/10/2020   COVID-19 virus infection 01/02/2019   Pneumonia due to COVID-19 virus 01/01/2019   Elevated blood pressure reading without diagnosis of hypertension 01/01/2019   Dehydration 01/01/2019   Sepsis ([place]) 01/01/2019    REFERRING DIAG: I61.9 (ICD-10-CM) - Stroke, 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 PAIN:  Are you having pain? No Denies Pain, but reports continued numbness.    OBJECTIVE/TREATMENT:  03/09/21: Gait speed: .96 m/s without Ad 5x sit to stand: 9 seconds Jump in place: 10x Lateral jumps: 5x R and L Ski jumps: 10x R and L Deep squats: 10x no  HHA SLS: 3 x 30" R and L Tandem walking: 3 x 20 feet Fast walking: 4 x 40 feet Mountain climbers: with plank on edge of mat table: 3 x 10 R and L, working on faster movements with each consecutive set.  03/03/21: Tennis ball massage in sitting to L foot: 3' Standing gastroc stretch off the bottom edge of step: 3 x 30" R and L Sit to stand: 15 lbs KB: 2 x 10 from 16" plyobox Standing mini jumps: 2 x 10 Lateral ski jump holds: 20x 3' holds R and L Fast walking sprints: 8 x 30 feet with cues to arm swing, with 10 feet of ramp up/down. Backwards walking: 6 x 20 feet , cues for arm swings    02/23/21  15x STS arms crossed with OH reach when standing  Standing with foam roll for support, Heel raises, toe raises, abd and marching 15x, alt. where appropriate, 3#  Scifit L2 8 min. Arms 6 seat 12  Seated core exercises of hip and shoulder tosses, chops and Vs for 10 reps ea. 3.3# ball  Standing L TKE with green band, 15x at 3 angles of pull  Seated LAQs and marching, 3#, alternating with latissimus press, 15reps  Ambulation 136ft w/AD encouraging TKE on heel strike     Reviewed HEP and progressed to patient's tolerance. Completed all of the following exercises during today's session, intermittent rest breaks required due to fatigue. Handout  provided and reviewed with patient: Sit to Stand with Arms Crossed - 2 x daily - 7 x weekly - 2 sets - 10 reps Heel Toe Raises with Counter Support - 1 x daily - 7 x weekly - 2 sets - 10 reps Standing Hip Extension with Counter Support - 1 x daily - 7 x weekly - 2 sets - 10 reps Standing Hip Abduction with Counter Support - 1 x daily - 7 x weekly - 2 sets - 10 reps   PATIENT EDUCATION: Education details: Occupational Therapy Referral and Benefits of OT Eval;  Berg Balance Score; Updated HEP Person educated: Patient Education method: Explanation and Demonstration Education comprehension: verbalized understanding, verbal cues required and needs further  education     HOME EXERCISE PROGRAM: Access Code: [email or web address] URL: [email or web address] Date: 01/06/2021 Prepared by: Baldomero Lamy  Exercises Sit to Stand with Arms Crossed - 2 x daily - 7 x weekly - 2 sets - 10 reps Heel Toe Raises with Counter Support - 1 x daily - 7 x weekly - 2 sets - 10 reps Standing Hip Extension with Counter Support - 1 x daily - 7 x weekly - 2 sets - 10 reps Standing Hip Abduction with Counter Support - 1 x daily - 7 x weekly - 2 sets - 10 reps      ASSESSMENT:   CLINICAL IMPRESSION:   Pt tolerated progression of plyometrics well Pt demonstrated fatigue in L LE with exercises and afterwards. Pt is motivated. Pt met LTG#1 today and demo 0.6 m/s improvement in his gait speed compared to previous assessment. Updated HEP to include SLS, tandem walk and deep squats.  REHAB POTENTIAL: Good   CLINICAL DECISION MAKING: Stable/uncomplicated   EVALUATION COMPLEXITY: Low     GOALS: Goals reviewed with patient? Yes   SHORT TERM GOALS:  STG Name Target Date Goal status  1 Patient will undergo further assessment of endurance with 2MWT/6MWT and LTG to be set as applicable Baseline: TBA 02/03/21 6 MWT Completed 01/13/21     LONG TERM GOALS: Revised as of 03/03/21   LTG Name Target Date Goal status  1 Patient will improve 5x sit <> stand to </= 12 seconds without UE support to demo improved balance and functional strength Comments: 02/01/21 15.1 sec; 9.25 seconds 03/09/21 04/14/21  Goal met  2 Pt will demo gait speed increase by 0.10 m/s to improve functional ambulation. Comments: 0.63m/s 11/12/20; 0.56m/s 03/09/21 04/14/21 On going  3 LTG to be set for 2MWT/6MWT as appropriate Comments: TBA 04/14/21 01/13/21 Goal is 1065ft  4 Pt will be independent with balance/strength HEP  Comments: HEP established 03/03/21 Goal met 03/03/21    PLAN: PT FREQUENCY: 1x/week   PT DURATION: 6 weeks   PLANNED INTERVENTIONS: Therapeutic exercises, Therapeutic activity, Neuro  Muscular re-education, Balance training, Gait training, Patient/Family education, Joint mobilization, Stair training, Orthotic/Fit training, Electrical stimulation, Cryotherapy and Moist heat   PLAN FOR NEXT SESSION: Assess LTG next session   Kerrie Pleasure, PT 03/09/2021, 3:24 PM    [facility] [address] [place], [place], [address] Phone: [phone]   Fax:  [phone]  Patient name: Mark Bass MRN: [id number] DOB: 05-12-1961

## 2021-03-09 NOTE — Patient Instructions (Signed)
Access Code: [email or web address] URL: [email or web address] Date: 03/09/2021 Prepared by: Kallie Edward  Exercises Standing Elbow Extension with Self-Anchored Resistance - 1 x daily - 7 x weekly - 3 sets - 10 reps Standing Elbow Flexion with Self-Anchored Resistance - 1 x daily - 7 x weekly - 3 sets - 10 reps Standing Shoulder Horizontal Abduction with Resistance - 1 x daily - 7 x weekly - 3 sets - 10 reps Standing Shoulder Flexion with Resistance - 1 x daily - 7 x weekly - 3 sets - 10 reps Standing Shoulder Diagonal Horizontal Abduction 60/120 Degrees with Resistance - 1 x daily - 7 x weekly - 3 sets - 10 reps Standing Single Arm Shoulder Abduction with Resistance - 1 x daily - 7 x weekly - 3 sets - 10 reps Single Arm Punch with Resistance - 1 x daily - 7 x weekly - 3 sets - 10 reps

## 2021-03-10 ENCOUNTER — Ambulatory Visit: Payer: Self-pay | Admitting: Cardiovascular Disease

## 2021-03-10 NOTE — Progress Notes (Deleted)
No chief complaint on file.  History of Present Illness: 60 yo male with history of HTN and recent intracranial hemorrhage in March 2022 in the setting of hypertensive crisis who is here today as a new patient for the evaluation of HTN. Admitted to [facility] March 2022 after a syncopal event and was found to have acute intracranial hemorrhage. BP was elevated. Echo March 2022 with LVEF=55-60% with no valve disease.   He tells me today that he ***  Primary Care Physician: Grayce Sessions, NP   Past Medical History:  Diagnosis Date   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HCC)    Hypertension     Past Surgical History:  Procedure Laterality Date   NECK SURGERY      Current Outpatient Medications  Medication Sig Dispense Refill   amLODipine (NORVASC) 10 MG tablet Take 1 tablet (10 mg total) by mouth daily. 90 tablet 1   aspirin EC 81 MG tablet Take 1 tablet (81 mg total) by mouth daily. Swallow whole. 30 tablet 11   atorvastatin (LIPITOR) 40 MG tablet Take 1 tablet (40 mg total) by mouth daily. 90 tablet 1   hydrochlorothiazide (HYDRODIURIL) 25 MG tablet Take 1 tablet (25 mg total) by mouth daily. 90 tablet 1   losartan (COZAAR) 50 MG tablet Take 1 tablet (50 mg total) by mouth 2 (two) times daily. 180 tablet 1   metoprolol tartrate (LOPRESSOR) 25 MG tablet Take 1 tablet (25 mg total) by mouth 2 (two) times daily. 180 tablet 1   Current Facility-Administered Medications  Medication Dose Route Frequency Provider Last Rate Last Admin   cloNIDine (CATAPRES) tablet 0.2 mg  0.2 mg Oral Daily Gwinda Passe P, NP   0.2 mg at 01/19/21 1126    No Known Allergies  Social History   Socioeconomic History   Marital status: Single    Spouse name: Not on file   Number of children: Not on file   Years of education: Not on file   Highest education level: Not on file  Occupational History   Not on file  Tobacco Use   Smoking status: Former   Smokeless tobacco: Never   Tobacco comments:     Patient smoked in his early 10s.  Substance and Sexual Activity   Alcohol use: Not Currently   Drug use: Never   Sexual activity: Not on file  Other Topics Concern   Not on file  Social History Narrative   Not on file   Social Determinants of Health   Financial Resource Strain: Not on file  Food Insecurity: Not on file  Transportation Needs: Not on file  Physical Activity: Not on file  Stress: Not on file  Social Connections: Not on file  Intimate Partner Violence: Not on file    Family History  Family history unknown: Yes    Review of Systems:  As stated in the HPI and otherwise negative.   There were no vitals taken for this visit.  Physical Examination: General: Well developed, well nourished, NAD  HEENT: OP clear, mucus membranes moist  SKIN: warm, dry. No rashes. Neuro: No focal deficits  Musculoskeletal: Muscle strength 5/5 all ext  Psychiatric: Mood and affect normal  Neck: No JVD, no carotid bruits, no thyromegaly, no lymphadenopathy.  Lungs:Clear bilaterally, no wheezes, rhonci, crackles Cardiovascular: Regular rate and rhythm. No murmurs, gallops or rubs. Abdomen:Soft. Bowel sounds present. Non-tender.  Extremities: No lower extremity edema. Pulses are 2 + in the bilateral DP/PT.  EKG:  EKG {ACTION; IS/IS  YTK:[id number]} ordered today. The ekg ordered today demonstrates ***  Recent Labs: 10/10/2020: ALT 17; Magnesium 1.8 10/13/2020: BUN 19; Creatinine, Ser 1.10; Hemoglobin 14.3; Platelets 223; Potassium 3.8; Sodium 136   Lipid Panel    Component Value Date/Time   CHOL 194 10/10/2020 0603   TRIG 165 (H) 10/10/2020 0603   HDL 51 10/10/2020 0603   CHOLHDL 3.8 10/10/2020 0603   VLDL 33 10/10/2020 0603   LDLCALC 110 (H) 10/10/2020 0603     Wt Readings from Last 3 Encounters:  03/02/21 127 lb (57.6 kg)  01/19/21 127 lb (57.6 kg)  11/12/20 129 lb (58.5 kg)     Other studies Reviewed: Additional studies/ records that were reviewed today include:  ***. Review of the above records demonstrates: ***   Assessment and Plan:   1.   Current medicines are reviewed at length with the patient today.  The patient {ACTIONS; HAS/DOES NOT HAVE:19233} concerns regarding medicines.  The following changes have been made:  {PLAN; NO CHANGE:13088:s}  Labs/ tests ordered today include: *** No orders of the defined types were placed in this encounter.    Disposition:   FU with *** in {gen number 2-75:170017} {TIME; UNITS DAY/WEEK/MONTH:19136}   Signed, Verne Carrow, MD 03/10/2021 5:42 AM    [facility] [address] [place], [place], [place]  [address] Phone: [phone]; Fax: [phone]

## 2021-03-16 ENCOUNTER — Other Ambulatory Visit: Payer: Self-pay

## 2021-03-16 ENCOUNTER — Ambulatory Visit: Payer: Self-pay | Admitting: Occupational Therapy

## 2021-03-16 ENCOUNTER — Ambulatory Visit: Payer: Self-pay

## 2021-03-16 ENCOUNTER — Encounter: Payer: Self-pay | Admitting: Occupational Therapy

## 2021-03-16 DIAGNOSIS — R2689 Other abnormalities of gait and mobility: Secondary | ICD-10-CM

## 2021-03-16 DIAGNOSIS — R41844 Frontal lobe and executive function deficit: Secondary | ICD-10-CM

## 2021-03-16 DIAGNOSIS — M6281 Muscle weakness (generalized): Secondary | ICD-10-CM

## 2021-03-16 DIAGNOSIS — R278 Other lack of coordination: Secondary | ICD-10-CM

## 2021-03-16 DIAGNOSIS — R2681 Unsteadiness on feet: Secondary | ICD-10-CM

## 2021-03-16 DIAGNOSIS — I69854 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: Secondary | ICD-10-CM

## 2021-03-16 DIAGNOSIS — R41840 Attention and concentration deficit: Secondary | ICD-10-CM

## 2021-03-16 NOTE — Therapy (Signed)
[facility] [address] [place], [place], [address] Phone: [phone]   Fax:  [phone]  Occupational Therapy Treatment  Patient Details  Name: Mark Bass MRN: [id number] Date of Birth: 06-Apr-1961 Referring Provider (OT): Ihor Austin, NP   Encounter Date: 03/16/2021   OT End of Session - 03/16/21 1537     Visit Number 4    Number of Visits 13    Date for OT Re-Evaluation 05/18/21    Authorization Type UHC    Authorization Time Period 30 visit limit (PT/OT)    OT Start Time 1536    OT Stop Time 1615    OT Time Calculation (min) 39 min    Activity Tolerance Patient tolerated treatment well    Behavior During Therapy [facility] for tasks assessed/performed             Past Medical History:  Diagnosis Date   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HCC)    Hypertension     Past Surgical History:  Procedure Laterality Date   NECK SURGERY      There were no vitals filed for this visit.   Subjective Assessment - 03/16/21 1537     Subjective  "sore and tired"    Patient is accompanied by: Interpreter   Misaen   Limitations Financial controller Needed. Fall Risk. Sensory Impairment    Patient Stated Goals "I don't know"    Currently in Pain? No/denies                          OT Treatments/Exercises (OP) - 03/16/21 0001       ADLs   Functional Mobility dynamic standing balance and standing tolerance with placing resistance clothespins x 1-8# x 3 sets with weight shifts for reaching. No LOB and with min fatigue present    ADL Comments reporting not doing too much cooking and cleaning d/t fatigue while standing      Work Hardening Exercises   Stationary Bike ARM BIKE x 6 minutes (3 forward and 3 backward) on level 4 for reciprocal movements and cardiovascular endurance      Visual/Perceptual Exercises   Scanning Environmental    Scanning - Environmental 100% accuracy with 16/16 correct and found on  first pass.      Sensation Exercises   Sensory Retraining pt  continues to report numbness and "heaviness" on LUE                      OT Short Term Goals - 03/16/21 1603       OT SHORT TERM GOAL #1   Title Pt will be independent with HEP (LUE proximal strengthening, grip strengthening)    Time 4    Period Weeks    Status Achieved    Target Date 03/23/21      OT SHORT TERM GOAL #2   Title Pt will increase grip strength in LUE by 5 lbs or greater for increasing functional use of LUE.    Baseline R 73.4 lbs, L 46.5 lbs    Time 4    Period Weeks    Status Achieved   64.8 lbs with LUE 03/09/21     OT SHORT TERM GOAL #3   Title Pt will verbalize understanding of sensory strategies for increasing safety and awareness to LUE.    Time 4    Period Weeks    Status On-going      OT SHORT TERM GOAL #  4   Title Pt will perform environmental scanning with 90% accuracy in a moderate distracting environment.    Time 4    Period Weeks    Status Achieved   100% accuracy              OT Long Term Goals - 03/09/21 1546       OT LONG TERM GOAL #1   Title Pt will be independent with updated HEPs    Time 12    Period Weeks    Status New      OT LONG TERM GOAL #2   Title Pt will perform physical and cognitive tasks simultaneously with 90% accuracy or greater    Time 12    Period Weeks    Status New      OT LONG TERM GOAL #3   Title Pt will increase grip strength in LUE to 56 lbs or greater for increase in functional use of LUE    Baseline R 73.4 lbs, L 46.5 lbs    Time 12    Period Weeks    Status Achieved   LUE 64.8 lbs 03/09/21     OT LONG TERM GOAL #4   Title Pt will verbalize understanding of return to driving recommendations    Time 12    Period Weeks    Status New      OT LONG TERM GOAL #5   Title Pt will perform simple warm meal prep and light housekeeping with mod I and good safety awareness    Baseline not currently doing    Time 12    Period Weeks     Status New                   Plan - 03/16/21 1608     Clinical Impression Statement Pt with improved envionmental scanning today. Pt with increased tolerance with standing this day .    OT Occupational Profile and History Problem Focused Assessment - Including review of records relating to presenting problem    Occupational performance deficits (Please refer to evaluation for details): IADL's;ADL's    Body Structure / Function / Physical Skills ADL;Coordination;Decreased knowledge of precautions;FMC;Decreased knowledge of use of DME;Dexterity;GMC;UE functional use;Strength;ROM;IADL    Cognitive Skills Attention;Problem Solve;Sequencing;Understand    Rehab Potential Good    Clinical Decision Making Limited treatment options, no task modification necessary    Comorbidities Affecting Occupational Performance: None    Modification or Assistance to Complete Evaluation  No modification of tasks or assist necessary to complete eval    OT Frequency 1x / week    OT Duration 12 weeks    OT Treatment/Interventions Self-care/ADL training;DME and/or AE instruction;Therapeutic activities;Therapeutic exercise;Cognitive remediation/compensation;Neuromuscular education;Patient/family education    Plan standing tolerance, dynamic balance    Consulted and Agree with Plan of Care Patient             Patient will benefit from skilled therapeutic intervention in order to improve the following deficits and impairments:   Body Structure / Function / Physical Skills: ADL, Coordination, Decreased knowledge of precautions, FMC, Decreased knowledge of use of DME, Dexterity, GMC, UE functional use, Strength, ROM, IADL Cognitive Skills: Attention, Problem Solve, Sequencing, Understand     Visit Diagnosis: 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)  Muscle weakness (generalized)  Other abnormalities of gait and mobility  Unsteadiness  on feet  Other lack of coordination  Attention and concentration deficit  Frontal lobe and executive  function deficit    Problem List Patient Active Problem List   Diagnosis Date Noted   Stroke, 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HCC) 10/10/2020   COVID-19 virus infection 01/02/2019   Pneumonia due to COVID-19 virus 01/01/2019   Elevated blood pressure reading without diagnosis of hypertension 01/01/2019   Dehydration 01/01/2019   Sepsis (HCC) 01/01/2019    Junious Dresser MOT, OTR/L  03/16/2021, 4:10 PM  [facility] [address] [place], [place], [address] Phone: [phone]   Fax:  [phone]  Name: Mark Bass MRN: [id number] Date of Birth: 1961/05/22

## 2021-03-16 NOTE — Therapy (Signed)
OUTPATIENT PHYSICAL THERAPY TREATMENT/RE-CERTIFICATION NOTE   Patient Name: Mark Bass MRN: [id number] DOB:[birth year], 60 y.o., male Today's Date: 03/16/2021  PCP: Kerin Perna, NP REFERRING PROVIDER: Kerin Perna, NP   PT End of Session - 03/16/21 1449     Visit Number 11    Number of Visits 15    Date for PT Re-Evaluation 04/14/21    Authorization Type UHC (30 VL between PT/OT); Recert 10/20/95 to 0/2/63 (1x/week for 6 more sessions)    Progress Note Due on Visit 15    PT Start Time 1445    PT Stop Time 1530    PT Time Calculation (min) 45 min    Equipment Utilized During Treatment Gait belt    Activity Tolerance Patient tolerated treatment well    Behavior During Therapy WFL for tasks assessed/performed               Past Medical History:  Diagnosis Date   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[place])    Hypertension    Past Surgical History:  Procedure Laterality Date   NECK SURGERY     Patient Active Problem List   Diagnosis Date Noted   Stroke, 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[place]) 10/10/2020   COVID-19 virus infection 01/02/2019   Pneumonia due to COVID-19 virus 01/01/2019   Elevated blood pressure reading without diagnosis of hypertension 01/01/2019   Dehydration 01/01/2019   Sepsis ([place]) 01/01/2019    REFERRING DIAG: I61.9 (ICD-10-CM) - Stroke, 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[place])  THERAPY DIAG:  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  PAIN:  Are you having pain? No Denies Pain, but reports continued numbness.    OBJECTIVE/TREATMENT:  03/16/21: Heel sitting to tall kneeling: 10x 0lbs; 20lbs 10x Q-ped: alternating UE lifts:  10x, alternating LE lifts: 10x, alternating opposite UE and LE lifts: 10x R and L Supine double knee to chest: 3 x 10 with ab bracing.  Supine semi- hooklying: Palloff rotations: green band: 2 x 10 R and L Fwd step up with ball chops to contralateral high knee: 3 x 5 R and L with 4 lb ball on 8 inch step Mountain climbers: plan at EOB: 3 x 10 Fwd jumps: 4 x 10 feet bil feet Side steps: green band: 10 x 3 steps R and L, no breaks Gait speed: 0.61m/s without cane at normal walking speed. 03/03/21: Tennis ball massage in sitting to L foot: 3' Standing gastroc stretch off the bottom edge of step: 3 x 30" R and L Sit to stand: 15 lbs KB: 2 x 10 from 16" plyobox Standing mini jumps: 2 x 10 Lateral ski jump holds: 20x 3' holds R and L Fast walking sprints: 8 x 30 feet with cues to arm swing, with 10 feet of ramp up/down. Backwards walking: 6 x 20 feet , cues for arm swings    02/23/21  15x STS arms crossed with OH reach when standing  Standing with foam roll for support, Heel raises, toe raises, abd and marching 15x, alt. where appropriate, 3#  Scifit L2 8 min. Arms 6 seat 12  Seated core exercises of hip and shoulder tosses, chops and Vs for 10 reps ea. 3.3# ball  Standing L TKE with green  band, 15x at 3 angles of pull  Seated LAQs and marching, 3#, alternating with latissimus press, 15reps  Ambulation 133ft w/AD encouraging TKE on heel strike  02/16/21  15x STS arms crossed with OH reach when standing  Standing with foam roll for support, Heel raises, toe raises, abd and marching 15x, alt. where appropriate, 2#  Scifit L1.5 8 min. Arms 6  Seated core exercises of hip and shoulder tosses, chops and Vs for 10 reps ea. 2.2# ball  Standing L TKE with green band, 15x at 3 angles of pull  Seated LAQs and marching, 2#, alternating with latissimus press, 15reps  Ambulation 125ft w/ AD encouraging TKE  on heel strike   02/09/21   15x STS arms crossed with OH reach when standing  Standing with  foam roll for support, Heel raises, toe raises, abd and marching 15x, alt. where appropriate  Scifit L1 8 min. Arms 6  FAQs and marching with latissimus pressdown, 15x alternating pattern  Seated core exercises of hip and shoulder tosses, chops and Vs for 10 reps ea. 1.1# ball  Standing TKE with red band, 15x at 3 angles of pull      Reviewed HEP and progressed to patient's tolerance. Completed all of the following exercises during today's session, intermittent rest breaks required due to fatigue. Handout provided and reviewed with patient: Sit to Stand with Arms Crossed - 2 x daily - 7 x weekly - 2 sets - 10 reps Heel Toe Raises with Counter Support - 1 x daily - 7 x weekly - 2 sets - 10 reps Standing Hip Extension with Counter Support - 1 x daily - 7 x weekly - 2 sets - 10 reps Standing Hip Abduction with Counter Support - 1 x daily - 7 x weekly - 2 sets - 10 reps   PATIENT EDUCATION: Education details: Occupational Therapy Referral and Benefits of OT Eval;  Berg Balance Score; Updated HEP Person educated: Patient Education method: Explanation and Demonstration Education comprehension: verbalized understanding, verbal cues required and needs further education     HOME EXERCISE PROGRAM: Access Code: [email or web address] URL: [email or web address] Date: 01/06/2021 Prepared by: Baldomero Lamy  Exercises Sit to Stand with Arms Crossed - 2 x daily - 7 x weekly - 2 sets - 10 reps Heel Toe Raises with Counter Support - 1 x daily - 7 x weekly - 2 sets - 10 reps Standing Hip Extension with Counter Support - 1 x daily - 7 x weekly - 2 sets - 10 reps Standing Hip Abduction with Counter Support - 1 x daily - 7 x weekly - 2 sets - 10 reps      ASSESSMENT:   CLINICAL IMPRESSION:   Pt tolerated session well. Demo good tolerance to progression of plyometrics and advanced balance and functional movement exercises.  REHAB POTENTIAL: Good   CLINICAL DECISION MAKING:  Stable/uncomplicated   EVALUATION COMPLEXITY: Low     GOALS: Goals reviewed with patient? Yes   SHORT TERM GOALS:  STG Name Target Date Goal status  1 Patient will undergo further assessment of endurance with 2MWT/6MWT and LTG to be set as applicable Baseline: TBA 02/03/21 6 MWT Completed 01/13/21     LONG TERM GOALS: Revised as of 03/03/21   LTG Name Target Date Goal status  1 Patient will improve 5x sit <> stand to </= 12 seconds without UE support to demo improved balance and functional strength Comments:  04/14/21  02/01/21 5x STS w/o UE assist 15.1s  2 Pt  will demo gait speed increase by 0.10 m/s to improve functional ambulation. Comments: 0.59m/s (eval) 04/14/21 On going  3 LTG to be set for 2MWT/6MWT as appropriate Comments: TBA 04/14/21 01/13/21 Goal is 1061ft  4 Pt will be independent with balance/strength HEP  Comments: HEP established 03/03/21 Goal met 03/03/21    PLAN: PT FREQUENCY: 1x/week   PT DURATION: 6 weeks   PLANNED INTERVENTIONS: Therapeutic exercises, Therapeutic activity, Neuro Muscular re-education, Balance training, Gait training, Patient/Family education, Joint mobilization, Stair training, Orthotic/Fit training, Electrical stimulation, Cryotherapy and Moist heat   PLAN FOR NEXT SESSION: Continue with progression of plyometrics, advanced balance exercises., assess gait speed in the beginning of the session   Kerrie Pleasure, PT 03/16/2021, 3:16 PM    [place] [address] [place], [place], [address] Phone: [phone]   Fax:  [phone]  Patient name: Mark Bass MRN: [id number] DOB: [birth year]

## 2021-03-23 ENCOUNTER — Encounter: Payer: Self-pay | Admitting: Occupational Therapy

## 2021-03-23 ENCOUNTER — Ambulatory Visit: Payer: Self-pay | Admitting: Occupational Therapy

## 2021-03-23 ENCOUNTER — Other Ambulatory Visit: Payer: Self-pay

## 2021-03-23 DIAGNOSIS — I69854 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: Secondary | ICD-10-CM

## 2021-03-23 DIAGNOSIS — R2681 Unsteadiness on feet: Secondary | ICD-10-CM

## 2021-03-23 DIAGNOSIS — R41844 Frontal lobe and executive function deficit: Secondary | ICD-10-CM

## 2021-03-23 DIAGNOSIS — R2689 Other abnormalities of gait and mobility: Secondary | ICD-10-CM

## 2021-03-23 DIAGNOSIS — R41840 Attention and concentration deficit: Secondary | ICD-10-CM

## 2021-03-23 DIAGNOSIS — M6281 Muscle weakness (generalized): Secondary | ICD-10-CM

## 2021-03-23 DIAGNOSIS — R278 Other lack of coordination: Secondary | ICD-10-CM

## 2021-03-23 NOTE — Therapy (Signed)
[facility] [address] [place], [place], [address] Phone: [phone]   Fax:  [phone]  Occupational Therapy Treatment  Patient Details  Name: Mark Bass MRN: [id number] Date of Birth: October 31, 1960 Referring Provider (OT): Ihor Austin, NP   Encounter Date: 03/23/2021   OT End of Session - 03/23/21 1452     Visit Number 5    Number of Visits 13    Date for OT Re-Evaluation 05/18/21    Authorization Type UHC    Authorization Time Period 30 visit limit (PT/OT)    OT Start Time 1450    OT Stop Time 1530    OT Time Calculation (min) 40 min    Activity Tolerance Patient tolerated treatment well    Behavior During Therapy [facility] for tasks assessed/performed             Past Medical History:  Diagnosis Date   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HCC)    Hypertension     Past Surgical History:  Procedure Laterality Date   NECK SURGERY      There were no vitals filed for this visit.   Subjective Assessment - 03/23/21 1447     Subjective  "just tired" "it's better" (LUE)    Patient is accompanied by: Interpreter   Misaen   Limitations Financial controller Needed. Fall Risk. Sensory Impairment    Patient Stated Goals "I don't know"    Currently in Pain? No/denies                          OT Treatments/Exercises (OP) - 03/23/21 1453       ADLs   Functional Mobility reporting long distances walking is challenging but short distances aren't bad. Gets fatigued after approx 150 feet. reports using cane for longer distsances but not around the house      Exercises   Exercises Hand      Cognitive Exercises   Attention Span Alternating Constant Therapy Alternating Sympbls level 4 with 88% accuracy and 53.05s response time.    Attention Span Divided ambulating while manipulating golf balls in LUE. Pt had difficulty with copleting both simultaneously and would stop rotating golf balls while ambulating  when cognitive task was introduced (ABCs)      Visual/Perceptual Exercises   Copy this Image Pegboard    Pegboard no difficulty with copying pattern. Some insonsistency with blue/purple    Scanning Tabletop    Scanning - Tabletop Perfection and visual spatial awareness with matching shapes      Fine Motor Coordination (Hand/Wrist)   Fine Motor Coordination Small Pegboard    Small Pegboard copied pattern on vertical surface with reaching for pegs to the left for dynamic weight shift and reaching on vertical surface for endurance and stamina with standing tolerance and with LUE shoulder.                      OT Short Term Goals - 03/16/21 1603       OT SHORT TERM GOAL #1   Title Pt will be independent with HEP (LUE proximal strengthening, grip strengthening)    Time 4    Period Weeks    Status Achieved    Target Date 03/23/21      OT SHORT TERM GOAL #2   Title Pt will increase grip strength in LUE by 5 lbs or greater for increasing functional use of LUE.    Baseline R 73.4 lbs, L  46.5 lbs    Time 4    Period Weeks    Status Achieved   64.8 lbs with LUE 03/09/21     OT SHORT TERM GOAL #3   Title Pt will verbalize understanding of sensory strategies for increasing safety and awareness to LUE.    Time 4    Period Weeks    Status On-going      OT SHORT TERM GOAL #4   Title Pt will perform environmental scanning with 90% accuracy in a moderate distracting environment.    Time 4    Period Weeks    Status Achieved   100% accuracy              OT Long Term Goals - 03/23/21 1504       OT LONG TERM GOAL #1   Title Pt will be independent with updated HEPs    Time 12    Period Weeks    Status On-going      OT LONG TERM GOAL #2   Title Pt will perform physical and cognitive tasks simultaneously with 90% accuracy or greater    Time 12    Period Weeks    Status On-going   pt recited ABC in his language with approx 4 erros - unknown if this was baseline or not      OT LONG TERM GOAL #3   Title Pt will increase grip strength in LUE to 56 lbs or greater for increase in functional use of LUE    Baseline R 73.4 lbs, L 46.5 lbs    Time 12    Period Weeks    Status Achieved   LUE 64.8 lbs 03/09/21     OT LONG TERM GOAL #4   Title Pt will verbalize understanding of return to driving recommendations    Time 12    Period Weeks    Status On-going      OT LONG TERM GOAL #5   Title Pt will perform simple warm meal prep and light housekeeping with mod I and good safety awareness    Baseline not currently doing    Time 12    Period Weeks    Status New                   Plan - 03/23/21 1523     Clinical Impression Statement Pt improving with overall tolerance. Pt with difficulty with divided attention.    OT Occupational Profile and History Problem Focused Assessment - Including review of records relating to presenting problem    Occupational performance deficits (Please refer to evaluation for details): IADL's;ADL's    Body Structure / Function / Physical Skills ADL;Coordination;Decreased knowledge of precautions;FMC;Decreased knowledge of use of DME;Dexterity;GMC;UE functional use;Strength;ROM;IADL    Cognitive Skills Attention;Problem Solve;Sequencing;Understand    Rehab Potential Good    Clinical Decision Making Limited treatment options, no task modification necessary    Comorbidities Affecting Occupational Performance: None    Modification or Assistance to Complete Evaluation  No modification of tasks or assist necessary to complete eval    OT Frequency 1x / week    OT Duration 12 weeks    OT Treatment/Interventions Self-care/ADL training;DME and/or AE instruction;Therapeutic activities;Therapeutic exercise;Cognitive remediation/compensation;Neuromuscular education;Patient/family education    Plan standing tolerance, dynamic balance, attention    Consulted and Agree with Plan of Care Patient             Patient will benefit from  skilled therapeutic intervention in order to improve  the following deficits and impairments:   Body Structure / Function / Physical Skills: ADL, Coordination, Decreased knowledge of precautions, FMC, Decreased knowledge of use of DME, Dexterity, GMC, UE functional use, Strength, ROM, IADL Cognitive Skills: Attention, Problem Solve, Sequencing, Understand     Visit Diagnosis: 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)  Unsteadiness on feet  Other abnormalities of gait and mobility  Muscle weakness (generalized)  Other lack of coordination  Frontal lobe and executive function deficit  Attention and concentration deficit    Problem List Patient Active Problem List   Diagnosis Date Noted   Stroke, 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HCC) 10/10/2020   COVID-19 virus infection 01/02/2019   Pneumonia due to COVID-19 virus 01/01/2019   Elevated blood pressure reading without diagnosis of hypertension 01/01/2019   Dehydration 01/01/2019   Sepsis (HCC) 01/01/2019    Junious Dresser MOT, OTR/L  03/23/2021, 5:30 PM  [facility] [address] [place], [place], [address] Phone: [phone]   Fax:  [phone]  Name: Mark Bass MRN: [id number] Date of Birth: [birth year]

## 2021-03-30 ENCOUNTER — Ambulatory Visit: Payer: Self-pay | Admitting: Occupational Therapy

## 2021-03-30 ENCOUNTER — Encounter: Payer: Self-pay | Admitting: Occupational Therapy

## 2021-03-30 ENCOUNTER — Other Ambulatory Visit: Payer: Self-pay

## 2021-03-30 ENCOUNTER — Ambulatory Visit: Payer: Self-pay

## 2021-03-30 DIAGNOSIS — M6281 Muscle weakness (generalized): Secondary | ICD-10-CM

## 2021-03-30 DIAGNOSIS — R2689 Other abnormalities of gait and mobility: Secondary | ICD-10-CM

## 2021-03-30 DIAGNOSIS — R41840 Attention and concentration deficit: Secondary | ICD-10-CM

## 2021-03-30 DIAGNOSIS — R41844 Frontal lobe and executive function deficit: Secondary | ICD-10-CM

## 2021-03-30 DIAGNOSIS — I69854 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: Secondary | ICD-10-CM

## 2021-03-30 DIAGNOSIS — R2681 Unsteadiness on feet: Secondary | ICD-10-CM

## 2021-03-30 DIAGNOSIS — R278 Other lack of coordination: Secondary | ICD-10-CM

## 2021-03-30 NOTE — Therapy (Signed)
[facility] [address] [place], [place], [address] Phone: [phone]   Fax:  [phone]  Occupational Therapy Treatment & Discharge  Patient Details  Name: Mark Bass MRN: [id number] Date of Birth: 05/11/1961 Referring Provider (OT): Frann Rider, NP   Encounter Date: 03/30/2021   OT End of Session - 03/30/21 1451     Visit Number 6    Number of Visits 13    Date for OT Re-Evaluation 05/18/21    Authorization Type Now: Self Pay - at eval: [place] Time Period 30 visit limit (PT/OT)    OT Start Time 1450    OT Stop Time 1529    OT Time Calculation (min) 39 min    Activity Tolerance Patient tolerated treatment well    Behavior During Therapy [facility] for tasks assessed/performed             Past Medical History:  Diagnosis Date   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   Subjective Assessment - 03/30/21 1450     Subjective  "i'm good - a little bit good"    Patient is accompanied by: Interpreter   Leodis Liverpool   Limitations [place] Interpreter Needed. Fall Risk. Sensory Impairment    Patient Stated Goals "I don't know"    Currently in Pain? No/denies               OCCUPATIONAL THERAPY DISCHARGE SUMMARY  Visits from Start of Care: 6  Current functional level related to goals / functional outcomes: Pt has improved with LUE coordination and grip strength and independence with HEPs.    Remaining deficits: Continues with some weakness in LUE   Education / Equipment: HEPs   Patient agrees to discharge. Patient goals were partially met. Patient is being discharged due to financial reasons.. Pt wishes to discharge today d/t limited financial resources and insurance elimination.              OT Treatments/Exercises (OP) - 03/30/21 1452       ADLs    Overall ADLs pt reports doing a lot of walking outside, cleaning up and taking bath himself.    Cooking pt is not doing any cooking - pt reports "my wife is home now so she can do the cooking". OT encourgaed patient to start doing more as wife will eventually go back to work. Pt reports "my left leg and left arm can't cook". OT educated on physical abilities for cooking and to monitor the sensation deficits for safety.      Visual/Perceptual Exercises   Copy this Image PVC    Pegboard Fig 3 with mod verbal and visual cues for getting started. After patient got started, patient was able to replicate image with 696% accuracy. Fig 13 completed with 100% accuracy      Functional Reaching Activities   High Level reaching with resistance clothespins 1-8# with LUE and bringing back down from antenna for shoulder endurance and strengthening and grip strengthening with LUE. Pt completed in standing. Dynamic component added for weight shifting and stepping outside of midline for dynamic balance.                      OT Short Term Goals - 03/16/21 1603       OT SHORT  TERM GOAL #1   Title Pt will be independent with HEP (LUE proximal strengthening, grip strengthening)    Time 4    Period Weeks    Status Achieved    Target Date 03/23/21      OT SHORT TERM GOAL #2   Title Pt will increase grip strength in LUE by 5 lbs or greater for increasing functional use of LUE.    Baseline R 73.4 lbs, L 46.5 lbs    Time 4    Period Weeks    Status Achieved   64.8 lbs with LUE 03/09/21     OT SHORT TERM GOAL #3   Title Pt will verbalize understanding of sensory strategies for increasing safety and awareness to LUE.    Time 4    Period Weeks    Status On-going      OT SHORT TERM GOAL #4   Title Pt will perform environmental scanning with 90% accuracy in a moderate distracting environment.    Time 4    Period Weeks    Status Achieved   100% accuracy              OT Long Term Goals -  03/23/21 1504       OT LONG TERM GOAL #1   Title Pt will be independent with updated HEPs    Time 12    Period Weeks    Status On-going      OT LONG TERM GOAL #2   Title Pt will perform physical and cognitive tasks simultaneously with 90% accuracy or greater    Time 12    Period Weeks    Status On-going   pt recited ABC in his language with approx 4 erros - unknown if this was baseline or not     OT LONG TERM GOAL #3   Title Pt will increase grip strength in LUE to 56 lbs or greater for increase in functional use of LUE    Baseline R 73.4 lbs, L 46.5 lbs    Time 12    Period Weeks    Status Achieved   LUE 64.8 lbs 03/09/21     OT LONG TERM GOAL #4   Title Pt will verbalize understanding of return to driving recommendations    Time 12    Period Weeks    Status On-going      OT LONG TERM GOAL #5   Title Pt will perform simple warm meal prep and light housekeeping with mod I and good safety awareness    Baseline not currently doing    Time 12    Period Weeks    Status New                   Plan - 03/30/21 1521     Clinical Impression Statement Pt improving with overall tolerance. Pt with difficulty with divided attention.    OT Occupational Profile and History Problem Focused Assessment - Including review of records relating to presenting problem    Occupational performance deficits (Please refer to evaluation for details): IADL's;ADL's    Body Structure / Function / Physical Skills ADL;Coordination;Decreased knowledge of precautions;FMC;Decreased knowledge of use of DME;Dexterity;GMC;UE functional use;Strength;ROM;IADL    Cognitive Skills Attention;Problem Solve;Sequencing;Understand    Rehab Potential Good    Clinical Decision Making Limited treatment options, no task modification necessary    Comorbidities Affecting Occupational Performance: None    Modification or Assistance to Complete Evaluation  No modification of tasks or assist necessary  to complete eval     OT Frequency 1x / week    OT Duration 12 weeks    OT Treatment/Interventions Self-care/ADL training;DME and/or AE instruction;Therapeutic activities;Therapeutic exercise;Cognitive remediation/compensation;Neuromuscular education;Patient/family education    Plan standing tolerance, dynamic balance, attention    Consulted and Agree with Plan of Care Patient             Patient will benefit from skilled therapeutic intervention in order to improve the following deficits and impairments:   Body Structure / Function / Physical Skills: ADL, Coordination, Decreased knowledge of precautions, FMC, Decreased knowledge of use of DME, Dexterity, GMC, UE functional use, Strength, ROM, IADL Cognitive Skills: Attention, Problem Solve, Sequencing, Understand     Visit Diagnosis: 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)  Unsteadiness on feet  Other abnormalities of gait and mobility  Muscle weakness (generalized)  Other lack of coordination  Frontal lobe and executive function deficit  Attention and concentration deficit    Problem List Patient Active Problem List   Diagnosis Date Noted   Stroke, 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

## 2021-03-30 NOTE — Therapy (Addendum)
OUTPATIENT PHYSICAL THERAPY TREATMENT/DC Summary   Patient Name: Mark Bass MRN: [id number] DOB:02/23/1961, 60 y.o., male Today's Date: 03/30/2021  PCP: Kerin Perna, NP REFERRING PROVIDER: Kerin Perna, NP  PHYSICAL THERAPY DISCHARGE SUMMARY  Visits from Start of Care: 12  Current functional level related to goals / functional outcomes: Continued need of cane for mobility at times, transfers independently, LLE shows mild strength deficits   Remaining deficits: Mild LLE strength deicits   Education / Equipment: [place] issued HEP to address deficits, uses cane as needed for mobility   Patient agrees to discharge. Patient goals were partially met. Patient is being discharged due to financial reasons.  Patient has exhausted current insurance resources.    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[place])    Hypertension    Past Surgical History:  Procedure Laterality Date   NECK SURGERY     Patient Active Problem List   Diagnosis Date Noted   Stroke, 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[place]) 10/10/2020   COVID-19 virus infection 01/02/2019   Pneumonia due to COVID-19 virus 01/01/2019   Elevated blood pressure reading without diagnosis of hypertension 01/01/2019   Dehydration 01/01/2019   Sepsis ([place]) 01/01/2019    REFERRING DIAG: I61.9 (ICD-10-CM) - Stroke, 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 No Denies Pain, but reports continued numbness.    OBJECTIVE/TREATMENT:   03/30/21 Sit to Stand with Arms Crossed 10 reps Heel Toe Raises with Counter Support 10 reps Standing Hip Extension with Counter Support 10 reps Standing Hip Abduction with Counter Support 10 reps HEP review 03/16/21: Heel sitting to tall kneeling: 10x 0lbs; 20lbs 10x Q-ped: alternating UE lifts: 10x, alternating LE lifts: 10x, alternating opposite UE and LE lifts: 10x R and L Supine double knee to chest: 3 x 10 with ab bracing.  Supine semi- hooklying: Palloff rotations: green band: 2 x 10 R and L Fwd step up with ball chops to contralateral high knee: 3 x 5 R and L with 4 lb ball on 8 inch step Mountain climbers: plan at EOB: 3 x 10 Fwd jumps: 4 x 10 feet bil feet Side steps: green band: 10 x 3 steps R and L, no breaks Gait speed: 0.46ms without cane at normal walking speed. 03/03/21: Tennis ball massage in sitting to L foot: 3' Standing gastroc stretch off the bottom edge of step: 3 x 30" R and L Sit to stand: 15 lbs KB: 2 x  10 from 16" plyobox Standing mini jumps: 2 x 10 Lateral ski jump holds: 20x 3' holds R and L Fast walking sprints: 8 x 30 feet with cues to arm swing, with 10 feet of ramp up/down. Backwards walking: 6 x 20 feet , cues for arm swings    02/23/21  15x STS arms crossed with OH reach when standing  Standing with foam roll for support, Heel raises, toe raises, abd and marching 15x, alt. where appropriate, 3#  Scifit L2 8 min. Arms 6 seat 12  Seated core exercises of hip and shoulder tosses, chops and Vs for 10 reps ea. 3.3#  ball  Standing L TKE with green band, 15x at 3 angles of pull  Seated LAQs and marching, 3#, alternating with latissimus press, 15reps  Ambulation 114f w/AD encouraging TKE on heel strike  02/16/21  15x STS arms crossed with OH reach when standing  Standing with foam roll for support, Heel raises, toe raises, abd and marching 15x, alt. where appropriate, 2#  Scifit L1.5 8 min. Arms 6  Seated core exercises of hip and shoulder tosses, chops and Vs for 10 reps ea. 2.2# ball  Standing L TKE with green band, 15x at 3 angles of pull  Seated LAQs and marching, 2#, alternating with latissimus press, 15reps  Ambulation 1167fw/ AD encouraging TKE  on heel strike   02/09/21   15x STS arms crossed with OH reach when standing  Standing with foam roll for support, Heel raises, toe raises, abd and marching 15x, alt. where appropriate  Scifit L1 8 min. Arms 6  FAQs and marching with latissimus pressdown, 15x alternating pattern  Seated core exercises of hip and shoulder tosses, chops and Vs for 10 reps ea. 1.1# ball  Standing TKE with red band, 15x at 3 angles of pull      Reviewed HEP and progressed to patient's tolerance. Completed all of the following exercises during today's session, intermittent rest breaks required due to fatigue. Handout provided and reviewed with patient: Sit to Stand with Arms Crossed - 2 x daily - 7 x weekly - 2 sets - 10 reps Heel Toe Raises with Counter Support - 1 x daily - 7 x weekly - 2 sets - 10 reps Standing Hip Extension with Counter Support - 1 x daily - 7 x weekly - 2 sets - 10 reps Standing Hip Abduction with Counter Support - 1 x daily - 7 x weekly - 2 sets - 10 reps   PATIENT EDUCATION: Education details: Occupational Therapy Referral and Benefits of OT Eval;  Berg Balance Score; Updated HEP Person educated: Patient Education method: Explanation and Demonstration Education comprehension: verbalized understanding, verbal cues required and needs further  education     HOME EXERCISE PROGRAM: Access Code: [email or web address]: [email or web address] Date: 01/06/2021 Prepared by: KaBaldomero LamyExercises Sit to Stand with Arms Crossed - 2 x daily - 7 x weekly - 2 sets - 10 reps Heel Toe Raises with Counter Support - 1 x daily - 7 x weekly - 2 sets - 10 reps Standing Hip Extension with Counter Support - 1 x daily - 7 x weekly - 2 sets - 10 reps Standing Hip Abduction with Counter Support - 1 x daily - 7 x weekly - 2 sets - 10 reps      ASSESSMENT:   CLINICAL IMPRESSION:   Patient has not had insurance coverage since 6/15, he has applied for M[employer] but has not heard back on approval, he  requests to hold further treatment until then.  HEP and goals reviewed.  Patient has met STS goal, gait velocity unchanged but functional.  REHAB POTENTIAL: Good   CLINICAL DECISION MAKING: Stable/uncomplicated   EVALUATION COMPLEXITY: Low     GOALS: Goals reviewed with patient? Yes   SHORT TERM GOALS:  STG Name Target Date Goal status  1 Patient will undergo further assessment of endurance with 2MWT/6MWT and LTG to be set as applicable Baseline: TBA 02/03/21 6 MWT Completed 01/13/21     LONG TERM GOALS: Revised as of 03/03/21   LTG Name Target Date Goal status  1 Patient will improve 5x sit <> stand to </= 12 seconds without UE support to demo improved balance and functional strength Comments:  04/14/21  02/01/21 5x STS w/o UE assist 15.1s 03/30/21 5xSTS in 12s w/o UE assist, goal met  2 Pt will demo gait speed increase by 0.10 m/s to improve functional ambulation. Comments: 0.33ms (eval) 04/14/21 On going 03/30/21 Gait velocity 0.82 m/s, goal met  3 LTG to be set for 2MWT/6MWT as appropriate Comments: TBA 04/14/21 01/13/21 Goal is 10060f2MWT 3505fith cane.  4 Pt will be independent with balance/strength HEP  Comments: HEP established 03/03/21 Goal met 03/03/21    PLAN: PT FREQUENCY: 1x/week   PT DURATION: 6 weeks   PLANNED INTERVENTIONS:  Therapeutic exercises, Therapeutic activity, Neuro Muscular re-education, Balance training, Gait training, Patient/Family education, Joint mobilization, Stair training, Orthotic/Fit training, Electrical stimulation, Cryotherapy and Moist heat   PLAN FOR NEXT SESSION: Patient with hold further PT until he receives confirmation from MCD regarding coverage.   JefLanice ShirtsT 03/30/2021, 4:02 PM    [facility]iFairvieweMarmoraC,[id number]: [phone]:  [phone] name: Mark ThuneN: [id number]: [birth year]

## 2021-04-06 ENCOUNTER — Ambulatory Visit: Payer: Self-pay

## 2021-04-06 ENCOUNTER — Ambulatory Visit: Payer: Self-pay | Admitting: Occupational Therapy

## 2021-04-13 ENCOUNTER — Ambulatory Visit: Payer: Self-pay

## 2021-04-13 ENCOUNTER — Encounter: Payer: 59 | Admitting: Occupational Therapy

## 2021-04-20 ENCOUNTER — Encounter: Payer: 59 | Admitting: Occupational Therapy

## 2021-04-20 ENCOUNTER — Ambulatory Visit: Payer: Self-pay

## 2021-05-11 ENCOUNTER — Telehealth: Payer: Self-pay | Admitting: Adult Health

## 2021-05-11 NOTE — Telephone Encounter (Signed)
Can pt return to work on light duty ?

## 2021-05-11 NOTE — Telephone Encounter (Addendum)
PHONE RM CAN RELAY AND SCHEDULE PT APPT  Contacted pt back to schedule appt, no answer

## 2021-05-11 NOTE — Telephone Encounter (Signed)
Previously seen end of July - looks like he completed therapies end of August but needed to continue with use of cane - he was previously working as a Estate agent. Please have pt schedule a follow-up visit to come back in to reassess current deficits as well as updated list of what his current job functions would include on "light duty".  Return to work can be further discussed at that time.  Thank you.

## 2021-05-11 NOTE — Telephone Encounter (Signed)
Pt called with his Financial Advisor on the [employer] regarding if pt can return to work for [employer] duty. Pt requesting a call back.

## 2021-05-20 ENCOUNTER — Encounter: Payer: Self-pay | Admitting: Cardiology

## 2021-05-20 ENCOUNTER — Other Ambulatory Visit: Payer: Self-pay

## 2021-05-20 ENCOUNTER — Ambulatory Visit: Payer: Self-pay | Admitting: Cardiology

## 2021-05-20 DIAGNOSIS — I619 Nontraumatic intracerebral hemorrhage, unspecified: Secondary | ICD-10-CM

## 2021-05-20 DIAGNOSIS — E7800 Pure hypercholesterolemia, unspecified: Secondary | ICD-10-CM | POA: Insufficient documentation

## 2021-05-20 DIAGNOSIS — I10 Essential (primary) hypertension: Secondary | ICD-10-CM | POA: Insufficient documentation

## 2021-05-20 MED ORDER — ASPIRIN EC 81 MG PO TBEC: 81.0000 mg | DELAYED_RELEASE_TABLET | Freq: Every day | ORAL | 11 refills | Status: AC

## 2021-05-20 NOTE — Assessment & Plan Note (Signed)
Continue with losartan 50 mg twice a day, metoprolol tartrate 25 mg twice a day, clonidine 0.2 mg daily, amlodipine 10 mg daily, hydrochlorothiazide 25 mg daily.  Medical management.  Excellent.  Blood pressure at prior visit was 130s.  Continue to monitor.  I have asked that he go back and see Gwinda Passe frequently to make sure his blood pressure is under check.

## 2021-05-20 NOTE — Progress Notes (Signed)
Cardiology Office Note:    Date:  05/20/2021   ID:  Mark Bass, DOB [birth year], MRN [id number]  PCP:  Grayce Sessions, NP   [facility] Providers Cardiologist:  None     Referring MD: Grayce Sessions, NP   History of Present Illness:    Mark Bass is a 60 y.o. male here for the evaluation of hemorrhagic stroke and essential hypertension at the request of Gwinda Passe, NP.  He was admitted 10/10/2020 for hemorrhagic stroke.  He was hypertensive stopped taking his medications for some time ago, felt dizzy headache went to bathroom and collapsed.  This was on 10/10/2020.  EMS was activated.  CT scan of the brain showed a 9 cm acute hematoma of the posterior right putamen.  Hypertensive emergency due to nonadherence was thought to be culprit.  He underwent a CTA of the head and neck which revealed advanced intracranial atherosclerosis.  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  An echo was performed that showed normal ejection fraction of 60%.  He was placed on atorvastatin 40 mg losartan 50 mg metoprolol tartrate 25 mg amlodipine 10 mg.  He saw Gwinda Passe, NP primary care provider.  Excellent job on controlling blood pressure.  He also saw neurology.  Still having some left-sided numbness.  Physical therapy performed.  Today: He is accompanied by an interpreter. Overall he is feeling normal today, and states that his breathing is okay.  In the left side of his body he is feeling numb and warm.   He denies any palpitations, or chest pain. No lightheadedness, headaches, syncope, orthopnea, or PND. Also has no lower extremity edema or exertional symptoms.   Past Medical History:  Diagnosis Date   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HCC)    Hypertension     Past Surgical History:  Procedure Laterality Date   NECK SURGERY      Current Medications: Current Meds  Medication Sig   amLODipine (NORVASC) 10 MG tablet Take 1 tablet (10 mg total) by mouth daily.   atorvastatin  (LIPITOR) 40 MG tablet Take 1 tablet (40 mg total) by mouth daily.   hydrochlorothiazide (HYDRODIURIL) 25 MG tablet Take 1 tablet (25 mg total) by mouth daily.   losartan (COZAAR) 50 MG tablet Take 1 tablet (50 mg total) by mouth 2 (two) times daily.   metoprolol tartrate (LOPRESSOR) 25 MG tablet Take 1 tablet (25 mg total) by mouth 2 (two) times daily.   Current Facility-Administered Medications for the 05/20/21 encounter (Office Visit) with Jake Bathe, MD  Medication   cloNIDine (CATAPRES) tablet 0.2 mg     Allergies:   Patient has no known allergies.   Social History   Socioeconomic History   Marital status: Single    Spouse name: Not on file   Number of children: Not on file   Years of education: Not on file   Highest education level: Not on file  Occupational History   Not on file  Tobacco Use   Smoking status: Former   Smokeless tobacco: Never   Tobacco comments:    Patient smoked in his early 55s.  Substance and Sexual Activity   Alcohol use: Not Currently   Drug use: Never   Sexual activity: Not on file  Other Topics Concern   Not on file  Social History Narrative   Not on file   Social Determinants of Health   Financial Resource Strain: Not on file  Food Insecurity: Not on file  Transportation Needs: Not on file  Physical Activity: Not on file  Stress: Not on file  Social Connections: Not on file     Family History: The patient's Family history is unknown by patient.  ROS:   Please see the history of present illness.    (+) Left-sided numbness, warmness All other systems reviewed and are negative.  EKGs/Labs/Other Studies Reviewed:    The following studies were reviewed today:  CTA Head/Neck 10/10/2020: FINDINGS: CTA NECK FINDINGS   Aortic arch: Normal arch.  Three vessel branching.   Right carotid system: Low-density atheromatous type wall thickening of the common carotid. No stenosis, ulceration, or beading.   Left carotid system:  Low-density atheromatous type wall thickening of the common carotid. No stenosis, ulceration, or beading.   Vertebral arteries: No proximal subclavian stenosis. Codominant vertebral arteries which are smooth and widely patent to the dura.   Skeleton: No acute or aggressive finding.   Other neck: Nodular and bandlike scarring in the suboccipital neck.   Upper chest: Negative   Review of the MIP images confirms the above findings   CTA HEAD FINDINGS   Anterior circulation: No proximal occlusion, aneurysm, or vascular malformation. Widespread atheromatous changes to ACA and MCA distributions. The worst stenosis is at the right A2/A3 level. Right MCA stenosis is notable for moderate M1 and M2 narrowings.   Posterior circulation: No proximal occlusion, aneurysm, or vascular malformation. Moderate atheromatous type narrowing at the right vertebrobasilar junction and at the mid basilar. Left more than right PCA atheromatous irregularity and stenosis.   Venous sinuses: Not opacified in the arterial phase   Anatomic variants: None significant.   Other: No spot sign seen at the parenchymal hemorrhage on the right which has a stable shape.   Review of the MIP images confirms the above findings   IMPRESSION: 1. No spot sign of or visible vascular lesion underlying the parenchymal hemorrhage. 2. Advanced intracranial atherosclerosis.  Echo 10/10/2020:  1. Left ventricular ejection fraction, by estimation, is 55 to 60%. The  left ventricle has normal function. The left ventricle has no regional  wall motion abnormalities. Left ventricular diastolic parameters are  consistent with Grade I diastolic  dysfunction (impaired relaxation).   2. Right ventricular systolic function is normal. The right ventricular  size is normal.   3. Left atrial size was mildly dilated.   4. The mitral valve is normal in structure. Trivial mitral valve  regurgitation. No evidence of mitral stenosis.   5.  The aortic valve is normal in structure. Aortic valve regurgitation is  not visualized. No aortic stenosis is present.   6. Aortic dilatation noted. There is mild dilatation of the aortic root,  measuring 39 mm.   7. The inferior vena cava is normal in size with greater than 50%  respiratory variability, suggesting right atrial pressure of 3 mmHg.   EKG:  EKG is personally reviewed and interpreted. 05/20/2021: EKG was not ordered today.  Recent Labs: 10/10/2020: ALT 17; Magnesium 1.8 10/13/2020: BUN 19; Creatinine, Ser 1.10; Hemoglobin 14.3; Platelets 223; Potassium 3.8; Sodium 136   Recent Lipid Panel    Component Value Date/Time   CHOL 194 10/10/2020 0603   TRIG 165 (H) 10/10/2020 0603   HDL 51 10/10/2020 0603   CHOLHDL 3.8 10/10/2020 0603   VLDL 33 10/10/2020 0603   LDLCALC 110 (H) 10/10/2020 0603     Risk Assessment/Calculations:          Physical Exam:    VS:  BP 140/80 (BP Location: Left Arm, Patient Position:  Sitting, Cuff Size: Normal)   Pulse 94   Ht 5\' 1"  (1.549 m)   Wt 129 lb (58.5 kg)   SpO2 98%   BMI 24.37 kg/m     Wt Readings from Last 3 Encounters:  05/20/21 129 lb (58.5 kg)  03/02/21 127 lb (57.6 kg)  01/19/21 127 lb (57.6 kg)     GEN: Well nourished, well developed in no acute distress, utilizes cane. HEENT: Normal NECK: No JVD; No carotid bruits LYMPHATICS: No lymphadenopathy CARDIAC: RRR, no murmurs, rubs, gallops RESPIRATORY:  Clear to auscultation without rales, wheezing or rhonchi  ABDOMEN: Soft, non-tender, non-distended MUSCULOSKELETAL:  No edema; No deformity  SKIN: Warm and dry NEUROLOGIC:  Alert and oriented x 3, mild left-sided numbness described PSYCHIATRIC:  Normal affect   ASSESSMENT:    1. Stroke, 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HCC)   2. Essential hypertension   3. Pure hypercholesterolemia    PLAN:    In order of problems listed above: Stroke, hemorrhagic [facility]) Neurology notes reviewed.  They did recommend aspirin 81 mg daily.  He was  not taking.  We will go ahead and restart.  They stated that this would be beneficial given his overall atherosclerotic disease noted.  Continue with statin, blood pressure control of utmost importance.  Excellent.  Essential hypertension Continue with losartan 50 mg twice a day, metoprolol tartrate 25 mg twice a day, clonidine 0.2 mg daily, amlodipine 10 mg daily, hydrochlorothiazide 25 mg daily.  Medical management.  Excellent.  Blood pressure at prior visit was 130s.  Continue to monitor.  I have asked that he go back and see [facility] frequently to make sure his blood pressure is under check.  Pure hypercholesterolemia Currently on atorvastatin 40 mg high intensity dose.  Excellent.  LDL prior to treatment was 110.  His primary provider Gwinda Passe can continue to monitor his lipids.  Excellent.  No further cardiac follow-up necessary.  Follow-up: PRN.  Medication Adjustments/Labs and Tests Ordered: Current medicines are reviewed at length with the patient today.  Concerns regarding medicines are outlined above.  No orders of the defined types were placed in this encounter.   Meds ordered this encounter  Medications   aspirin EC 81 MG tablet    Sig: Take 1 tablet (81 mg total) by mouth daily. Swallow whole.    Dispense:  30 tablet    Refill:  11    Patient Instructions  Medication Instructions:  Please make sure to take Asprin 81 mg once a day. Continue all other medications as listed.  *If you need a refill on your cardiac medications before your next appointment, please call your pharmacy*  Follow-Up: At [facility], you and your health needs are our priority.  As part of our continuing mission to provide you with exceptional heart care, we have created designated Provider Care Teams.  These Care Teams include your primary Cardiologist (physician) and Advanced Practice Providers (APPs -  Physician Assistants and Nurse Practitioners) who all work together to  provide you with the care you need, when you need it.  We recommend signing up for the patient portal called "MyChart".  Sign up information is provided on this After Visit Summary.  MyChart is used to connect with patients for Virtual Visits (Telemedicine).  Patients are able to view lab/test results, encounter notes, upcoming appointments, etc.  Non-urgent messages can be sent to your provider as well.   To learn more about what you can do with MyChart, go to [facility].  Your next appointment:   Follow up with Dr Anne Fu as needed.  Please schedule follow up with Gwinda Passe, NP [phone]    I,Mathew Stumpf,acting as a scribe for Donato Schultz, MD.,have documented all relevant documentation on the behalf of Donato Schultz, MD,as directed by  Donato Schultz, MD while in the presence of Donato Schultz, MD.  I, Donato Schultz, MD, have reviewed all documentation for this visit. The documentation on 05/20/21 for the exam, diagnosis, procedures, and orders are all accurate and complete.   Signed, Donato Schultz, MD  05/20/2021 9:38 AM    [facility]

## 2021-05-20 NOTE — Patient Instructions (Signed)
Medication Instructions:  Please make sure to take Asprin 81 mg once a day. Continue all other medications as listed.  *If you need a refill on your cardiac medications before your next appointment, please call your pharmacy*  Follow-Up: At [facility], you and your health needs are our priority.  As part of our continuing mission to provide you with exceptional heart care, we have created designated Provider Care Teams.  These Care Teams include your primary Cardiologist (physician) and Advanced Practice Providers (APPs -  Physician Assistants and Nurse Practitioners) who all work together to provide you with the care you need, when you need it.  We recommend signing up for the patient portal called "MyChart".  Sign up information is provided on this After Visit Summary.  MyChart is used to connect with patients for Virtual Visits (Telemedicine).  Patients are able to view lab/test results, encounter notes, upcoming appointments, etc.  Non-urgent messages can be sent to your provider as well.   To learn more about what you can do with MyChart, go to [email or web address].    Your next appointment:   Follow up with Dr Anne Fu as needed.  Please schedule follow up with Gwinda Passe, NP [phone]

## 2021-05-20 NOTE — Assessment & Plan Note (Signed)
Neurology notes reviewed.  They did recommend aspirin 81 mg daily.  He was not taking.  We will go ahead and restart.  They stated that this would be beneficial given his overall atherosclerotic disease noted.  Continue with statin, blood pressure control of utmost importance.  Excellent.

## 2021-05-20 NOTE — Assessment & Plan Note (Signed)
Currently on atorvastatin 40 mg high intensity dose.  Excellent.  LDL prior to treatment was 110.  His primary provider Gwinda Passe can continue to monitor his lipids.  Excellent.

## 2021-06-02 NOTE — Congregational Nurse Program (Signed)
CN office visit with interpreter Diu Hartshorn assisting.  Helped patient complete Medicaid application.  Brantley Fling RN, Congregational Nurse [phone]

## 2021-06-09 NOTE — Congregational Nurse Program (Signed)
CN office visit with interpreter Diu Hartshorn assisting.  Patient needed help to make follow-up appointment for hypertension at [facility].  Scheduled for 06/22/2021 at 3:15 with NP Gwinda Passe for BP follow-up.  [employer] intern Nia Jones helped patient with medical bills.  Patient was given flu shot.  Brantley Fling RN, Congregational Nurse [phone].

## 2021-06-22 ENCOUNTER — Encounter (INDEPENDENT_AMBULATORY_CARE_PROVIDER_SITE_OTHER): Payer: Self-pay | Admitting: Primary Care

## 2021-06-22 ENCOUNTER — Ambulatory Visit (INDEPENDENT_AMBULATORY_CARE_PROVIDER_SITE_OTHER): Payer: Self-pay | Admitting: Primary Care

## 2021-06-22 ENCOUNTER — Other Ambulatory Visit: Payer: Self-pay

## 2021-06-22 VITALS — BP 148/83 | HR 89 | Temp 97.5°F | Ht 61.0 in | Wt 132.6 lb

## 2021-06-22 DIAGNOSIS — E119 Type 2 diabetes mellitus without complications: Secondary | ICD-10-CM

## 2021-06-22 DIAGNOSIS — I10 Essential (primary) hypertension: Secondary | ICD-10-CM

## 2021-06-22 DIAGNOSIS — I619 Nontraumatic intracerebral hemorrhage, unspecified: Secondary | ICD-10-CM

## 2021-06-22 DIAGNOSIS — Z760 Encounter for issue of repeat prescription: Secondary | ICD-10-CM

## 2021-06-22 LAB — POCT GLYCOSYLATED HEMOGLOBIN (HGB A1C): Hemoglobin A1C: 7.3 % — AB (ref 4.0–5.6)

## 2021-06-22 MED ORDER — AMLODIPINE BESYLATE 10 MG PO TABS: 10.0000 mg | ORAL_TABLET | Freq: Every day | ORAL | 1 refills | Status: DC

## 2021-06-22 MED ORDER — CLONIDINE HCL 0.2 MG PO TABS: 0.2000 mg | ORAL_TABLET | Freq: Two times a day (BID) | ORAL | 1 refills | Status: DC

## 2021-06-22 MED ORDER — HYDROCHLOROTHIAZIDE 25 MG PO TABS
25.0000 mg | ORAL_TABLET | Freq: Every day | ORAL | 1 refills | Status: DC
Start: 2021-06-22 — End: 2021-12-22

## 2021-06-22 MED ORDER — METFORMIN HCL ER 500 MG PO TB24
500.0000 mg | ORAL_TABLET | Freq: Every day | ORAL | 1 refills | Status: DC
Start: 2021-06-22 — End: 2021-09-22

## 2021-06-22 MED ORDER — LOSARTAN POTASSIUM 50 MG PO TABS: 50.0000 mg | ORAL_TABLET | Freq: Two times a day (BID) | ORAL | 1 refills | Status: DC

## 2021-06-22 MED ORDER — ATORVASTATIN CALCIUM 40 MG PO TABS: 40.0000 mg | ORAL_TABLET | Freq: Every day | ORAL | 1 refills | Status: DC

## 2021-06-22 MED ORDER — METOPROLOL TARTRATE 25 MG PO TABS: 25.0000 mg | ORAL_TABLET | Freq: Two times a day (BID) | ORAL | 1 refills | Status: DC

## 2021-06-22 NOTE — Progress Notes (Signed)
[facility]   [name] is a 59 y.o. [place] male( interpreter Mark Bass) presents for hypertension evaluation, Denies shortness of breath, headaches, chest pain or lower extremity edema, sudden onset, vision changes, unilateral weakness, dizziness. Since he has had his stroke he has lost feeling , sensation and the use of his left side.  Dx of T2 D- Pertinent ROS:  Polyuria - No Polydipsia - No Vision problems - No Patient reports adherence with medications.  Dietary habits include: cultural- rice with fresh vegetables, chicken, beef, [place] every type of meat Exercise habits include:no  Family / Social history: No    Past Medical History:  Diagnosis Date   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HCC)    Hypertension    Past Surgical History:  Procedure Laterality Date   NECK SURGERY     No Known Allergies Current Outpatient Medications on File Prior to Visit  Medication Sig Dispense Refill   amLODipine (NORVASC) 10 MG tablet Take 1 tablet (10 mg total) by mouth daily. 90 tablet 1   aspirin EC 81 MG tablet Take 1 tablet (81 mg total) by mouth daily. Swallow whole. 30 tablet 11   atorvastatin (LIPITOR) 40 MG tablet Take 1 tablet (40 mg total) by mouth daily. 90 tablet 1   hydrochlorothiazide (HYDRODIURIL) 25 MG tablet Take 1 tablet (25 mg total) by mouth daily. 90 tablet 1   losartan (COZAAR) 50 MG tablet Take 1 tablet (50 mg total) by mouth 2 (two) times daily. 180 tablet 1   metoprolol tartrate (LOPRESSOR) 25 MG tablet Take 1 tablet (25 mg total) by mouth 2 (two) times daily. 180 tablet 1   Current Facility-Administered Medications on File Prior to Visit  Medication Dose Route Frequency Provider Last Rate Last Admin   cloNIDine (CATAPRES) tablet 0.2 mg  0.2 mg Oral Daily Grayce Sessions, NP   0.2 mg at 01/19/21 1126   Social History   Socioeconomic History   Marital status: Single    Spouse name: Not on file   Number of children: Not on file   Years of  education: Not on file   Highest education level: Not on file  Occupational History   Not on file  Tobacco Use   Smoking status: Former   Smokeless tobacco: Never   Tobacco comments:    Patient smoked in his early 29s.  Substance and Sexual Activity   Alcohol use: Not Currently   Drug use: Never   Sexual activity: Not on file  Other Topics Concern   Not on file  Social History Narrative   Not on file   Social Determinants of Health   Financial Resource Strain: Not on file  Food Insecurity: Not on file  Transportation Needs: Not on file  Physical Activity: Not on file  Stress: Not on file  Social Connections: Not on file  Intimate Partner Violence: Not on file   Family History  Family history unknown: Yes     OBJECTIVE:  Vitals:   06/22/21 1600  BP: (!) 148/83  Pulse: 89  Temp: (!) 97.5 F (36.4 C)  TempSrc: Temporal  SpO2: 94%  Weight: 132 lb 9.6 oz (60.1 kg)  Height: 5\' 1"  (1.549 m)    Physical exam: General: Vital signs reviewed.  Patient is well-developed and well-nourished, xx in no acute distress and cooperative with exam. Head: Normocephalic and atraumatic. Eyes: EOMI, conjunctivae normal, no scleral icterus. Neck: Supple, trachea midline, normal ROM, no JVD, masses, thyromegaly, or carotid bruit present. Cardiovascular: RRR,  S1 normal, S2 normal, no murmurs, gallops, or rubs. Pulmonary/Chest: Clear to auscultation bilaterally, no wheezes, rales, or rhonchi. Abdominal: Soft, non-tender, non-distended, BS +, no masses, organomegaly, or guarding present. Musculoskeletal: No joint deformities, erythema, or stiffness, ROM full and nontender. Extremities: No lower extremity edema bilaterally,  pulses symmetric and intact bilaterally. No cyanosis or clubbing. Neurological: A&O x3, Strength is normal Skin: Warm, dry and intact. No rashes or erythema. Psychiatric: Normal mood and affect. speech and behavior is normal. Cognition and memory are normal.      ROS Comprehensive ROS pertinent negative and positive noted in HPI Last 3 Office BP readings: BP Readings from Last 3 Encounters:  06/22/21 (!) 148/83  05/20/21 140/80  03/02/21 132/76    BMET    Component Value Date/Time   NA 136 10/13/2020 0401   K 3.8 10/13/2020 0401   CL 102 10/13/2020 0401   CO2 26 10/13/2020 0401   GLUCOSE 120 (H) 10/13/2020 0401   BUN 19 10/13/2020 0401   CREATININE 1.10 10/13/2020 0401   CALCIUM 9.3 10/13/2020 0401   GFRNONAA >60 10/13/2020 0401   GFRAA >60 01/03/2019 0518    Renal function: CrCl cannot be calculated (Patient's most recent lab result is older than the maximum 21 days allowed.).  Clinical ASCVD: Yes  The 10-year ASCVD risk score (Arnett DK, et al., 2019) is: 26%   Values used to calculate the score:     Age: 21 years     Sex: Male     Is Non-Hispanic African American: No     Diabetic: Yes     Tobacco smoker: No     Systolic Blood Pressure: 148 mmHg     Is BP treated: Yes     HDL Cholesterol: 51 mg/dL     Total Cholesterol: 194 mg/dL  ASCVD risk factors include- Mark Bass   ASSESSMENT & PLAN:  Mark Bass was seen today for hypertension and diabetes.  Diagnoses and all orders for this visit:  Stroke, hemorrhagic [facility])  followed by neurology and physical therapy  Type 2 diabetes mellitus without complication, without long-term current use of ins Monitor foods that are high in carbohydrates are the following rice, potatoes, breads, sugars, and pastas.  Reduction in the intake (eating) will assist in lowering your blood sugars.  Metformin 500mg  XR daily   Medication refill -     amLODipine (NORVASC) 10 MG tablet; Take 1 tablet (10 mg total) by mouth daily. -     atorvastatin (LIPITOR) 40 MG tablet; Take 1 tablet (40 mg total) by mouth daily. -     hydrochlorothiazide (HYDRODIURIL) 25 MG tablet; Take 1 tablet (25 mg total) by mouth daily. -     losartan (COZAAR) 50 MG tablet; Take 1 tablet (50 mg total) by mouth 2 (two) times  daily. -     metoprolol tartrate (LOPRESSOR) 25 MG tablet; Take 1 tablet (25 mg total) by mouth 2 (two) times daily.  Other orders -     metFORMIN (GLUCOPHAGE XR) 500 MG 24 hr tablet; Take 1 tablet (500 mg total) by mouth daily with breakfast. -     cloNIDine (CATAPRES) 0.2 MG tablet; Take 1 tablet (0.2 mg total) by mouth 2 (two) times daily.  Essential hypertension -Counseled on lifestyle modifications for blood pressure control including reduced dietary sodium, increased exercise, weight reduction and adequate sleep. Also, educated patient about the risk for cardiovascular events, stroke and heart attack. Also counseled patient about the importance of medication adherence. If you participate in  smoking, it is important to stop using tobacco as this will increase the risks associated with uncontrolled blood pressure.   -Hypertension longstanding diagnosed currently amlodipine 10 mg daily hydrochlorothiazide 25 mg and losartan 50 mg daily , clonidine 0.2 mg twice daily and metoprolol 25 mg twice daily on current medications. Patient is adherent with current medications.   Goal BP:  For patients younger than 60: Goal BP < 130/80. For patients 60 and older: Goal BP < 140/90. For patients with diabetes: Goal BP < 130/80. Your most recent BP: 143/83  Minimize salt intake. Minimize alcohol intake    This note has been created with Education officer, environmental. Any transcriptional errors are unintentional.   Grayce Sessions, NP 06/22/2021, 4:19 PM

## 2021-06-23 NOTE — Congregational Nurse Program (Signed)
CN office visit with interpreter Diu Hartshorn assisting.  Helped patient complete interview with Medicaid worker over the phone. She stated that he most likely will be over income because of his current long term disability payment thru his employer and his SSI payment but application will be submitted to the state for their determination. Brantley Fling RN, Congregational Nurse [phone]

## 2021-06-30 NOTE — Congregational Nurse Program (Signed)
CN office visit with interpreter Diu Hartshorn.  Helped patient understand letter from [facility] caseworker and provide requested document for application.  Mark Bass [phone]

## 2021-07-07 NOTE — Congregational Nurse Program (Signed)
CN office visit.  [employer] intern Tobe Sos and CN helped patient submit application for insurance through [employer].  He will return next week to pick plan with help of Interpreter.  Brantley Fling RN, Congregational Nurse [phone]

## 2021-07-14 NOTE — Congregational Nurse Program (Signed)
CN office visit with interpreter Diu Hartshorn assisting along with [employer] intern Nia Jones.  We helped patient complete applications over the phone with the [employer] for Health and Dental insurance which will be effective on 08/08/2021.  Brantley Fling RN, Congregational Nurse [phone].

## 2021-07-28 NOTE — Congregational Nurse Program (Signed)
CN office visit.  Patient received letter from [facility] worker stating he is over income to be eligible for Medicaid at this time.  If his bank balance goes below $2000 he needs to submit copy of his bank statement.  Since patient now has SS Disability we called the SSA office to see if he is eligible for Medicare.  He has to wait 24 months after being approved for disability to apply for Medicare.  States he paid his premium for Mark Bass) which should become effective 08/08/2021. Mark Fling RN, Congregational Nurse [phone]

## 2021-08-10 ENCOUNTER — Telehealth (INDEPENDENT_AMBULATORY_CARE_PROVIDER_SITE_OTHER): Payer: Self-pay | Admitting: Primary Care

## 2021-08-10 NOTE — Telephone Encounter (Signed)
Jennifer from [employer], called to get appt notes from patients last recent appt. She says they only received summary. They need the complete notes.

## 2021-09-09 ENCOUNTER — Other Ambulatory Visit: Payer: Self-pay

## 2021-09-09 ENCOUNTER — Ambulatory Visit: Payer: 59 | Admitting: Adult Health

## 2021-09-09 ENCOUNTER — Encounter: Payer: Self-pay | Admitting: Adult Health

## 2021-09-09 VITALS — BP 158/95 | HR 97 | Ht 61.0 in | Wt 134.0 lb

## 2021-09-09 DIAGNOSIS — I610 Nontraumatic intracerebral hemorrhage in hemisphere, subcortical: Secondary | ICD-10-CM | POA: Diagnosis not present

## 2021-09-09 DIAGNOSIS — R252 Cramp and spasm: Secondary | ICD-10-CM | POA: Diagnosis not present

## 2021-09-09 DIAGNOSIS — I69398 Other sequelae of cerebral infarction: Secondary | ICD-10-CM | POA: Diagnosis not present

## 2021-09-09 NOTE — Progress Notes (Signed)
[facility] [address] [place]. [place] [id number] [phone]       STROKE FOLLOW UP NOTE  [name] Date of Birth:  03/25/1961 Medical Record Number:  [id number]    Primary neurologist: Dr. Pearlean BrownieSethi Reason for Referral:  stroke follow up    SUBJECTIVE:   CHIEF COMPLAINT:  Chief Complaint  Patient presents with   Follow-up    RM 3 with (has [facility] interpreter)  Pt is well, states things have improved a lot. No new concerns      HPI:   Update 09/09/2021 JM: Returns for 5255-month stroke follow-up [facility]. Overall doing very well with improvement of left sided weakness. Does still have left sided numbness and heaviness sensation which can worsen with cold weather. Intermittent use of cane - denies any recent falls. Remains on short term disability managed by PCP. Denies new stroke/TIA symptoms. Compliant on aspirin and atorvastatin without side effects. Blood pressure today 158/95 - routinely monitors at home and typically stable. No further concerns at this time.     History provided for reference purposes only Update 03/02/2021 JM: [name] returns for 8055-month stroke follow-up [facility].  Stable since prior visit without new stroke/TIA symptoms and reports residual left-sided weakness and intermittent numbness currently working with PT/OT. Does report some improvement since prior visit.  Continues to ambulate with a cane and denies any recent falls.  Reports compliance on atorvastatin without associated side effects.  Blood pressure today 132/76.  No further concerns at this time.  Update 11/12/2020 JM [name] is being seen for hospital follow-up accompanied by his son, Mark Bass, and a [facility] interpreter   Stable since discharge without new stroke/TIA symptoms Reports residual left arm and leg weakness and numbness - has been slowly improving  Initial PT evaluation today - has not yet been called to  schedule OT Previously working as a Production designer, theatre/television/filmfork lift operator in a warehouse - currently in the process of applying for short term disability He is living his wife and 2 children at home - able to maintain ADLs independently but does have supervision  Compliant on Lipitor - denies associated side effects Blood pressure today 126/87 - monitors BP at home typically 130s  No further concerns at this time  Stroke admission 10/10/2020 [name] is a 61 y.o. male with history of HTN and medication non-compliance. [facility]2020 after awakening at 4am to go to the bathroom and became dizzy with headache as well as difficulty standing and left facial droop.  Personally reviewed hospitalization pertinent progress notes, lab work and imaging with summary provided.  Evaluated by Dr. 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  BP stabilized on Cleviprex and started amlodipine, losartan and metoprolol.  LDL 110 -initiated atorvastatin 40 mg daily at discharge.  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  Evaluated by therapies initially recommending CIR but pt and wife [facility] or [facility] therapies  ICH: Right BG, likely due to HTN etiology.  CT head - right BG ICH, ~ 10cc CTA head & neck: severe athro disease, but no underlying AVM or etiology for bleed. No Spot sign.  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 No underlying mass. EF 55-60% LDL 110 HgbA1c 6.6 UDS  negative VTE prophylaxis - SCDs No antithrombotics PTA, now not on antithrombotics due to ICH Therapy recommendation - CIR --> HH or OP PT/OT Deposition - home     ROS:   14 system review of systems performed and negative with exception of those listed in HPI  PMH:  Past Medical History:  Diagnosis  Date   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HCC)    Hypertension     PSH:  Past Surgical History:  Procedure Laterality Date   NECK SURGERY      Social History:  Social History   Socioeconomic History   Marital status: Single    Spouse name: Not on file   Number of children: Not on file   Years of education: Not on file   Highest education level: Not on file  Occupational History   Not on file  Tobacco Use   Smoking status: Former   Smokeless tobacco: Never   Tobacco comments:    Patient smoked in his early 28s.  Substance and Sexual Activity   Alcohol use: Not Currently   Drug use: Never   Sexual activity: Not on file  Other Topics Concern   Not on file  Social History Narrative   Not on file   Social Determinants of Health   Financial Resource Strain: Not on file  Food Insecurity: Not on file  Transportation Needs: Not on file  Physical Activity: Not on file  Stress: Not on file  Social Connections: Not on file  Intimate Partner Violence: Not on file    Family History:  Family History  Family history unknown: Yes    Medications:   Current Outpatient Medications on File Prior to Visit  Medication Sig Dispense Refill   amLODipine (NORVASC) 10 MG tablet Take 1 tablet (10 mg total) by mouth daily. 90 tablet 1   aspirin EC 81 MG tablet Take 1 tablet (81 mg total) by mouth daily. Swallow whole. 30 tablet 11   atorvastatin (LIPITOR) 40 MG tablet Take 1 tablet (40 mg total) by mouth daily. 90 tablet 1   cloNIDine (CATAPRES) 0.2 MG tablet Take 1 tablet (0.2 mg total) by mouth 2 (two) times daily. 180 tablet 1   hydrochlorothiazide (HYDRODIURIL) 25 MG tablet Take 1 tablet (25 mg total) by mouth daily. 90 tablet 1   losartan (COZAAR) 50 MG tablet Take 1 tablet (50 mg total) by mouth 2 (two) times daily. 180 tablet 1   metFORMIN (GLUCOPHAGE XR) 500 MG 24 hr tablet Take 1 tablet (500 mg total) by mouth daily with breakfast. 90 tablet 1   metoprolol tartrate (LOPRESSOR) 25 MG  tablet Take 1 tablet (25 mg total) by mouth 2 (two) times daily. 180 tablet 1   No current facility-administered medications on file prior to visit.    Allergies:  No Known Allergies    OBJECTIVE:  Physical Exam  Vitals:   09/09/21 1232  BP: (!) 158/95  Pulse: 97  Weight: 134 lb (60.8 kg)  Height: 5\' 1"  (1.549 m)    Body mass index is 25.32 kg/m. No results found.  General: well developed, well nourished,  pleasant middle-aged male, seated, in no evident distress Head: head normocephalic and atraumatic.   Neck: supple with no carotid or supraclavicular bruits Cardiovascular: regular rate and rhythm, no murmurs Musculoskeletal: no deformity Skin:  no rash/petichiae Vascular:  Normal pulses all extremities   Neurologic Exam Mental Status: Awake and fully alert.  Primarily speaking - denies speech or language difficulties.  Oriented to  place and time. Recent and remote memory intact. Attention span, concentration and fund of knowledge appropriate. Mood and affect appropriate.  Cranial Nerves: Pupils equal, briskly reactive to light. Extraocular movements full without nystagmus. Visual fields full to confrontation. Hearing intact. Facial sensation intact. Face, tongue, palate moves normally and symmetrically.  Motor: Normal bulk, tone and strength right upper and lower extremity.  LUE: 5/5 with slighly increased tone and mildly decreased hand dexterity; LLE: 5/5 with increased tone left ankle Sensory.:  Intact to light touch, vibratory and pinprick sensation Coordination: Rapid alternating movements normal in all extremities except slightly decreased left hand. Finger-to-nose and heel-to-shin performed accurately on right side. Gait and Station: Arises from chair without difficulty. Stance is normal. Gait demonstrates normal stride length and balance without use of AD. Tandem walk and heel toe without difficulty.  Romberg negative Reflexes: 2+ LUE and LLE; 1+ RUE and  RLE. Toes downgoing.        ASSESSMENT: Amair Shrout is a 61 y.o. year old male presented with dizziness, difficulty standing, left facial droop and headache on 10/10/2020 with stroke work-up revealing right BG ICH likely hypertensive etiology.  Vascular risk factors include HTN with medication noncompliance, HLD, severe arthrosclerotic disease, old hypertensive hemorrhages on imaging and prior tobacco use.      PLAN:  R BG ICH :  Residual deficit: left sided spasticity with paresthesias - greatly improved since prior visit. Short term disability managed by PCP Continue aspirin 81 mg daily and atorvastatin for secondary stroke prevention and severe arthrosclerotic disease Discussed secondary stroke prevention measures and importance of close PCP follow up for aggressive stroke risk factor management  HTN: BP goal <130/90.  Stable on current regimen per PCP -discussed importance of medication compliance and routine monitoring at home HLD: LDL goal <70.  On atorvastatin 40 mg daily -request repeat lab work at follow-up visit with PCP in 2 weeks DM: A1c goal<7. A1c 7.3 (06/2021) on metformin per PCP    Overall stable. No further recommendations. Follow up as needed   CC:  PCP: Grayce Sessions, NP    I spent 33 minutes of face-to-face and non-face-to-face time with patient assisted by interpreter.  This included previsit chart review, lab review, study review, electronic health record documentation, patient education regarding stroke with residual deficits and etiology, secondary stroke measures and aggressive stroke risk factor management, and answered all other questions to patient satisfaction  Ihor Austin, [facility]  [facility] [address] [place], [place] [address]  Phone [phone] Fax [phone] Note: This document was prepared with digital dictation and possible smart phrase technology. Any transcriptional errors that result from this  process are unintentional.

## 2021-09-09 NOTE — Patient Instructions (Addendum)
Your Plan:  Continue current treatment - no changes today     Follow up as needed     Thank you for coming to see [place] at [facility]. I hope we have been able to provide you high quality care today.  You may receive a patient satisfaction survey over the next few weeks. We would appreciate your feedback and comments so that we may continue to improve ourselves and the health of our patients.

## 2021-09-22 ENCOUNTER — Encounter (INDEPENDENT_AMBULATORY_CARE_PROVIDER_SITE_OTHER): Payer: Self-pay | Admitting: Primary Care

## 2021-09-22 ENCOUNTER — Other Ambulatory Visit: Payer: Self-pay

## 2021-09-22 ENCOUNTER — Ambulatory Visit (INDEPENDENT_AMBULATORY_CARE_PROVIDER_SITE_OTHER): Payer: Self-pay | Admitting: Primary Care

## 2021-09-22 VITALS — BP 166/108 | HR 82 | Temp 97.5°F | Resp 16 | Wt 135.0 lb

## 2021-09-22 DIAGNOSIS — Z760 Encounter for issue of repeat prescription: Secondary | ICD-10-CM

## 2021-09-22 DIAGNOSIS — E119 Type 2 diabetes mellitus without complications: Secondary | ICD-10-CM | POA: Diagnosis not present

## 2021-09-22 DIAGNOSIS — I10 Essential (primary) hypertension: Secondary | ICD-10-CM

## 2021-09-22 MED ORDER — METFORMIN HCL ER 500 MG PO TB24: 500.0000 mg | ORAL_TABLET | Freq: Every day | ORAL | 1 refills | Status: AC

## 2021-09-22 MED ORDER — LOSARTAN POTASSIUM 100 MG PO TABS: 100.0000 mg | ORAL_TABLET | Freq: Two times a day (BID) | ORAL | 1 refills | Status: DC

## 2021-09-22 NOTE — Progress Notes (Signed)
Subjective:  Patient ID: Mark Bass, male    DOB: 1961-04-18  Age: 61 y.o. MRN: [id number]  CC: Diabetes and Medication Refill   HPI Mark Bass is a 61 year old [place] male ( interpreter Daih )presents forFollow-up of diabetes. Patient does not check blood sugar at home  Compliant with meds - Yes Checking CBGs? No  Fasting avg -   Postprandial average -  Exercising regularly? - Yes Watching carbohydrate intake? - Yes Neuropathy ? - No Hypoglycemic events - No  - Recovers with :   Pertinent ROS:  Polyuria - No Polydipsia - No Vision problems - No  Medications as noted below. Taking them regularly without complication/adverse reaction being reported today.   History Mark Bass has a past medical history of 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[place]) and Hypertension.   Mark Bass has a past surgical history that includes Neck surgery.   His Family history is unknown by patient.Mark Bass reports that Mark Bass has quit smoking. Mark Bass has never used smokeless tobacco. Mark Bass reports that Mark Bass does not currently use alcohol. Mark Bass reports that Mark Bass does not use drugs.  Current Outpatient Medications on File Prior to Visit  Medication Sig Dispense Refill   amLODipine (NORVASC) 10 MG tablet Take 1 tablet (10 mg total) by mouth daily. 90 tablet 1   aspirin EC 81 MG tablet Take 1 tablet (81 mg total) by mouth daily. Swallow whole. 30 tablet 11   atorvastatin (LIPITOR) 40 MG tablet Take 1 tablet (40 mg total) by mouth daily. 90 tablet 1   cloNIDine (CATAPRES) 0.2 MG tablet Take 1 tablet (0.2 mg total) by mouth 2 (two) times daily. 180 tablet 1   hydrochlorothiazide (HYDRODIURIL) 25 MG tablet Take 1 tablet (25 mg total) by mouth daily. 90 tablet 1   metoprolol tartrate (LOPRESSOR) 25 MG tablet Take 1 tablet (25 mg total) by mouth 2 (two) times daily. 180 tablet 1   No current facility-administered medications on file prior to visit.    ROS Comprehensive ROS Pertinent positive and negative noted in HPI    Objective:  BP (!)  166/108 (BP Location: Left Arm, Cuff Size: Normal)    Pulse 82    Temp (!) 97.5 F (36.4 C)    Resp 16    Wt 135 lb (61.2 kg)    SpO2 98%    BMI 25.51 kg/m   BP Readings from Last 3 Encounters:  09/22/21 (!) 166/108  09/09/21 (!) 158/95  06/22/21 (!) 148/83    Wt Readings from Last 3 Encounters:  09/22/21 135 lb (61.2 kg)  09/09/21 134 lb (60.8 kg)  06/22/21 132 lb 9.6 oz (60.1 kg)    Physical Exam Vitals reviewed.  Constitutional:      Appearance: Normal appearance.  HENT:     Head: Normocephalic.     Right Ear: External ear normal.     Left Ear: External ear normal.     Nose: Nose normal.  Eyes:     Extraocular Movements: Extraocular movements intact.  Cardiovascular:     Rate and Rhythm: Normal rate and regular rhythm.  Pulmonary:     Effort: Pulmonary effort is normal.     Breath sounds: Normal breath sounds.  Abdominal:     General: Bowel sounds are normal.     Palpations: Abdomen is soft.  Musculoskeletal:        General: Normal range of motion.     Cervical back: Normal range of motion.  Skin:    General: Skin is warm.  Neurological:     Mental Status: Mark Bass is alert and oriented to person, place, and time.  Psychiatric:        Mood and Affect: Mood normal.        Behavior: Behavior normal.        Thought Content: Thought content normal.        Judgment: Judgment normal.   Lab Results  Component Value Date   HGBA1C 7.3 (A) 06/22/2021   HGBA1C 6.6 (H) 10/10/2020    Lab Results  Component Value Date   WBC 10.3 10/13/2020   HGB 14.3 10/13/2020   HCT 44.5 10/13/2020   PLT 223 10/13/2020   GLUCOSE 120 (H) 10/13/2020   CHOL 194 10/10/2020   TRIG 165 (H) 10/10/2020   HDL 51 10/10/2020   LDLCALC 110 (H) 10/10/2020   ALT 17 10/10/2020   AST 23 10/10/2020   NA 136 10/13/2020   K 3.8 10/13/2020   CL 102 10/13/2020   CREATININE 1.10 10/13/2020   BUN 19 10/13/2020   CO2 26 10/13/2020   INR 0.9 10/10/2020   HGBA1C 7.3 (A) 06/22/2021     Assessment &  Plan:  Mark Bass was seen today for diabetes and medication refill.  Diagnoses and all orders for this visit:  Type 2 diabetes mellitus without complication, without long-term current use of insulin (HCC) A1C improved to 6.7.  Continue to monitor foods that are high in carbohydrates are the following rice, potatoes, breads, sugars, and pastas.  Reduction in the intake (eating) will assist in lowering your blood sugars.  -     metFORMIN (GLUCOPHAGE XR) 500 MG 24 hr tablet; Take 1 tablet (500 mg total) by mouth daily with breakfast. -     Lipid panel; Future -     CMP14+EGFR; Future -     CBC with Differential/Platelet; Future  Essential hypertension Counseled on blood pressure goal of less than 130/80, low-sodium, DASH diet, medication compliance, 150 minutes of moderate intensity exercise per week. Discussed medication compliance, adverse effects.  -     losartan (COZAAR) 100 MG tablet; Take 2 tablet (50 mg total) by mouth 2 (two) times daily.( Increased Losartan to $RemoveBef'100mg'QsUOQBthEj$  bid f/u 1 month) -     CMP14+EGFR; Future  Medication refill -     losartan (COZAAR) 100 MG tablet; Take 1 tablet (100 mg total) by mouth 2 (two) times daily.   I have changed Mark Bass's losartan. I am also having him maintain his aspirin EC, amLODipine, atorvastatin, hydrochlorothiazide, metoprolol tartrate, cloNIDine, and metFORMIN.  Meds ordered this encounter  Medications   losartan (COZAAR) 100 MG tablet    Sig: Take 1 tablet (100 mg total) by mouth 2 (two) times daily.    Dispense:  180 tablet    Refill:  1   metFORMIN (GLUCOPHAGE XR) 500 MG 24 hr tablet    Sig: Take 1 tablet (500 mg total) by mouth daily with breakfast.    Dispense:  90 tablet    Refill:  1     Follow-up:   Return in about 4 weeks (around 10/20/2021) for Bp adjusted meds.  The above assessment and management plan was discussed with the patient. The patient verbalized understanding of and has agreed to the management plan. Patient is aware  to call the clinic if symptoms fail to improve or worsen. Patient is aware when to return to the clinic for a follow-up visit. Patient educated on when it is appropriate to go to the emergency department.  Juluis Mire, NP-C

## 2021-09-22 NOTE — Patient Instructions (Signed)
m °

## 2021-09-27 LAB — POCT GLYCOSYLATED HEMOGLOBIN (HGB A1C): HbA1c, POC (controlled diabetic range): 6.7 % (ref 0.0–7.0)

## 2021-09-27 NOTE — Addendum Note (Signed)
Addended by: Guy Franco on: 09/27/2021 02:19 PM   Modules accepted: Orders

## 2021-10-14 ENCOUNTER — Other Ambulatory Visit (INDEPENDENT_AMBULATORY_CARE_PROVIDER_SITE_OTHER): Payer: Self-pay | Admitting: Primary Care

## 2021-10-14 DIAGNOSIS — I619 Nontraumatic intracerebral hemorrhage, unspecified: Secondary | ICD-10-CM

## 2021-10-14 DIAGNOSIS — Z760 Encounter for issue of repeat prescription: Secondary | ICD-10-CM

## 2021-10-14 DIAGNOSIS — I10 Essential (primary) hypertension: Secondary | ICD-10-CM

## 2021-10-20 ENCOUNTER — Ambulatory Visit (INDEPENDENT_AMBULATORY_CARE_PROVIDER_SITE_OTHER): Payer: 59 | Admitting: Primary Care

## 2021-10-20 ENCOUNTER — Telehealth (INDEPENDENT_AMBULATORY_CARE_PROVIDER_SITE_OTHER): Payer: Self-pay

## 2021-10-20 ENCOUNTER — Encounter (INDEPENDENT_AMBULATORY_CARE_PROVIDER_SITE_OTHER): Payer: Self-pay | Admitting: Primary Care

## 2021-10-20 ENCOUNTER — Other Ambulatory Visit: Payer: Self-pay

## 2021-10-20 VITALS — BP 160/102 | HR 80 | Temp 97.5°F | Ht 61.0 in

## 2021-10-20 DIAGNOSIS — E785 Hyperlipidemia, unspecified: Secondary | ICD-10-CM | POA: Diagnosis not present

## 2021-10-20 DIAGNOSIS — I10 Essential (primary) hypertension: Secondary | ICD-10-CM | POA: Diagnosis not present

## 2021-10-20 DIAGNOSIS — I619 Nontraumatic intracerebral hemorrhage, unspecified: Secondary | ICD-10-CM | POA: Diagnosis not present

## 2021-10-20 DIAGNOSIS — E119 Type 2 diabetes mellitus without complications: Secondary | ICD-10-CM | POA: Diagnosis not present

## 2021-10-20 NOTE — Telephone Encounter (Signed)
Copied from CRM [id number]. 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 Minda Ditto stated pt was last seen in their office during the time frame of April - March 30, 2021. Cb# [phone] ?

## 2021-10-20 NOTE — Progress Notes (Signed)
?Mark ? ?Tavien Bass, is a 61 y.o. male ? ?YDX:[id number] ? ?HMC:[id number] ? ?DOB - [birth year] ?Mark Bass  ?Chief Complaint  ?Patient presents with  ? Blood Pressure Check  ?  Medications adjusted  ?    ? ?Subjective:  ? ?Mark Bass is a 61 y.o. male here today for a follow up visit for the management of HTN- Denies shortness of breath, headaches, chest pain or lower extremity edema  Patient has No headache, No chest pain, No abdominal pain - No Nausea, No new weakness tingling or numbness, No Cough -shortness of breath. Reviewed medications and refills and according to refills he has been out of 4 out of 5 Bp medication . Today Bp 160/102 all meds have another refill for 90 days. He also presents for paper work for disability following CVA unclear . Left side of body remains numb with weakness.  ? ?No problems updated. ? ?No Known Allergies ? ?Past Medical History:  ?Diagnosis Date  ? 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HCC)   ? Hypertension   ? ? ?Current Outpatient Medications on File Prior to Visit  ?Medication Sig Dispense Refill  ? amLODipine (NORVASC) 10 MG tablet Take 1 tablet (10 mg total) by mouth daily. 90 tablet 1  ? aspirin EC 81 MG tablet Take 1 tablet (81 mg total) by mouth daily. Swallow whole. 30 tablet 11  ? atorvastatin (LIPITOR) 40 MG tablet Take 1 tablet (40 mg total) by mouth daily. 90 tablet 1  ? cloNIDine (CATAPRES) 0.2 MG tablet Take 1 tablet (0.2 mg total) by mouth 2 (two) times daily. 180 tablet 1  ? hydrochlorothiazide (HYDRODIURIL) 25 MG tablet Take 1 tablet (25 mg total) by mouth daily. 90 tablet 1  ? losartan (COZAAR) 100 MG tablet Take 1 tablet (100 mg total) by mouth 2 (two) times daily. 180 tablet 1  ? metFORMIN (GLUCOPHAGE XR) 500 MG 24 hr tablet Take 1 tablet (500 mg total) by mouth daily with breakfast. 90 tablet 1  ? metoprolol tartrate (LOPRESSOR) 25 MG tablet Take 1 tablet (25 mg total) by mouth 2 (two) times daily. 180 tablet 1  ? ?No current facility-administered  medications on file prior to visit.  ? ? ?Objective:  ? ?Vitals:  ? 10/20/21 1414 10/20/21 1426  ?BP: (!) 160/89 (!) 160/102  ?Pulse: 78 80  ?Temp: (!) 97.5 ?F (36.4 ?C)   ?TempSrc: Oral   ?SpO2: 96%   ?Height: _0  (1.549 m)   ? ? ?Exam ?General appearance : Awake, alert, not in any distress. Speech Clear. Not toxic looking ?HEENT: Atraumatic and Normocephalic, pupils equally reactive to light and accomodation ?Neck: Supple, no JVD. No cervical lymphadenopathy.  ?Chest: Good air entry bilaterally, no added sounds  ?CVS: S1 S2 regular, no murmurs.  ?Abdomen: Bowel sounds present, Non tender and not distended with no gaurding, rigidity or rebound. ?Extremities: B/L Lower Ext shows no edema, both legs are warm to touch ?Neurology: Awake alert, and oriented X 3,  Non focal ?Skin: No Rash ? ?Data Review ?Lab Results  ?Component Value Date  ? HGBA1C 6.7 09/22/2021  ? HGBA1C 7.3 (A) 06/22/2021  ? HGBA1C 6.6 (H) 10/10/2020  ? ? ?Assessment & Plan  ?Mark Bass was seen today for blood pressure check. ? ?Diagnoses and all orders for this visit: ? ?Essential hypertension ?Not taking medication correctly misunderstanding, BP goal - < 130/80 ?Explained that having normal blood pressure is the goal and medications are helping to get to goal and maintain normal blood pressure. ?  DIET: Limit salt intake, read nutrition labels to check salt content, limit fried and high fatty foods  ?Avoid using multisymptom OTC cold preparations that generally contain sudafed which can rise BP. Consult with pharmacist on best cold relief products to use for persons with HTN ?EXERCISE ?Discussed incorporating exercise such as walking - 30 minutes most days of the week and can do in 10 minute intervals    ?-     CMP14+EGFR ? 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 ?Hyperlipidemia LDL goal <70 ? Healthy lifestyle diet of fruits vegetables fish nuts whole grains and low saturated fat . Foods  high in cholesterol or liver, fatty meats,cheese, butter avocados, nuts and seeds, chocolate and fried foods. ? ?  ? ?Type 2 diabetes mellitus without complication, without long-term current use of insulin ([place]) ?Improved Continue to monitor foods that are high in carbohydrates are the following rice, potatoes, breads, sugars, and pastas.  Reduction in the intake (eating) will assist in lowering your blood sugars.  ?-     CBC with Differential/Platelet ?-     CMP14+EGFR ?-     Lipid panel ? ?  ?There are no diagnoses linked to this encounter. ? ? ?Patient have been counseled extensively about nutrition and exercise. Other issues discussed during this visit include: low cholesterol diet, weight control and daily exercise, foot care, annual eye examinations at Ophthalmology, importance of adherence with medications and regular follow-up. We also discussed long term complications of uncontrolled diabetes and hypertension.  ? ?No follow-ups on file. ? ?The patient was given clear instructions to go to ER or return to medical center if symptoms don't improve, worsen or new problems develop. The patient verbalized understanding. The patient was told to call to get lab results if they haven't heard anything in the next week.  ? ?This note has been created with Surveyor, quantity. Any transcriptional errors are unintentional.  ? ?Kerin Perna, NP ?10/20/2021, 2:46 PM  ?

## 2021-10-21 LAB — CBC WITH DIFFERENTIAL/PLATELET
Basophils Absolute: 0.1 10*3/uL (ref 0.0–0.2)
Basos: 1 %
EOS (ABSOLUTE): 1 10*3/uL — ABNORMAL HIGH (ref 0.0–0.4)
Eos: 7 %
Hematocrit: 43.5 % (ref 37.5–51.0)
Hemoglobin: 13.4 g/dL (ref 13.0–17.7)
Immature Grans (Abs): 0 10*3/uL (ref 0.0–0.1)
Immature Granulocytes: 0 %
Lymphocytes Absolute: 3.3 10*3/uL — ABNORMAL HIGH (ref 0.7–3.1)
Lymphs: 25 %
MCH: 21.1 pg — ABNORMAL LOW (ref 26.6–33.0)
MCHC: 30.8 g/dL — ABNORMAL LOW (ref 31.5–35.7)
MCV: 68 fL — ABNORMAL LOW (ref 79–97)
Monocytes Absolute: 1 10*3/uL — ABNORMAL HIGH (ref 0.1–0.9)
Monocytes: 7 %
Neutrophils Absolute: 7.9 10*3/uL — ABNORMAL HIGH (ref 1.4–7.0)
Neutrophils: 60 %
Platelets: 302 10*3/uL (ref 150–450)
RBC: 6.36 x10E6/uL — ABNORMAL HIGH (ref 4.14–5.80)
RDW: 15.8 % — ABNORMAL HIGH (ref 11.6–15.4)
WBC: 13.2 10*3/uL — ABNORMAL HIGH (ref 3.4–10.8)

## 2021-10-21 LAB — CMP14+EGFR
ALT: 17 IU/L (ref 0–44)
AST: 19 IU/L (ref 0–40)
Albumin/Globulin Ratio: 1.3 (ref 1.2–2.2)
Albumin: 4.5 g/dL (ref 3.8–4.8)
Alkaline Phosphatase: 68 IU/L (ref 44–121)
BUN/Creatinine Ratio: 13 (ref 10–24)
BUN: 16 mg/dL (ref 8–27)
Bilirubin Total: 0.2 mg/dL (ref 0.0–1.2)
CO2: 25 mmol/L (ref 20–29)
Calcium: 9.9 mg/dL (ref 8.6–10.2)
Chloride: 97 mmol/L (ref 96–106)
Creatinine, Ser: 1.21 mg/dL (ref 0.76–1.27)
Globulin, Total: 3.5 g/dL (ref 1.5–4.5)
Glucose: 105 mg/dL — ABNORMAL HIGH (ref 70–99)
Potassium: 4.3 mmol/L (ref 3.5–5.2)
Sodium: 137 mmol/L (ref 134–144)
Total Protein: 8 g/dL (ref 6.0–8.5)
eGFR: 67 mL/min/{1.73_m2} (ref >59)

## 2021-10-21 LAB — LIPID PANEL
Chol/HDL Ratio: 3.1 ratio (ref 0.0–5.0)
Cholesterol, Total: 157 mg/dL (ref 100–199)
HDL: 50 mg/dL (ref >39)
LDL Chol Calc (NIH): 77 mg/dL (ref 0–99)
Triglycerides: 176 mg/dL — ABNORMAL HIGH (ref 0–149)
VLDL Cholesterol Cal: 30 mg/dL (ref 5–40)

## 2021-10-21 NOTE — Telephone Encounter (Signed)
Order placed to PT he can be re-evaluate and determine limitations  ?

## 2021-10-28 ENCOUNTER — Other Ambulatory Visit (INDEPENDENT_AMBULATORY_CARE_PROVIDER_SITE_OTHER): Payer: Self-pay | Admitting: Primary Care

## 2021-10-28 DIAGNOSIS — I10 Essential (primary) hypertension: Secondary | ICD-10-CM

## 2021-10-28 DIAGNOSIS — Z760 Encounter for issue of repeat prescription: Secondary | ICD-10-CM

## 2021-10-28 MED ORDER — ATORVASTATIN CALCIUM 40 MG PO TABS: 40.0000 mg | ORAL_TABLET | Freq: Every day | ORAL | 1 refills | Status: DC

## 2021-11-22 ENCOUNTER — Encounter: Payer: Self-pay | Admitting: Family Medicine

## 2021-11-22 ENCOUNTER — Ambulatory Visit: Payer: 59 | Admitting: Family Medicine

## 2021-11-22 DIAGNOSIS — Z760 Encounter for issue of repeat prescription: Secondary | ICD-10-CM

## 2021-11-22 DIAGNOSIS — I10 Essential (primary) hypertension: Secondary | ICD-10-CM | POA: Diagnosis not present

## 2021-11-22 MED ORDER — METOPROLOL TARTRATE 50 MG PO TABS: 50.0000 mg | ORAL_TABLET | Freq: Two times a day (BID) | ORAL | 1 refills | Status: DC

## 2021-11-22 NOTE — Patient Instructions (Signed)
Welcome to [facility]! It was a pleasure meeting you today. ? ?As discussed, Please schedule a 1 month follow up visit today. ? ?Increase lopressor(metoprolol) to 50mg  twice daily. -  so can do 2 of the 25mg  tabs or the new one.   ? ?PLEASE NOTE: ? ?If you had any LAB tests please let know if you have not heard back within a few days. You may see your results on MyChart before we have a chance to review them but we will give you a call once they are reviewed by . If we ordered any REFERRALS today, please let us know if you have not heard from their office within the next week.  ?Let us know through MyChart if you are needing REFILLS, or have your pharmacy send [place] the request. You can also use MyChart to communicate with me or any office staff. ? ?Please try these tips to maintain a healthy lifestyle: ? ?Eat most of your calories during the day when you are active. Eliminate processed foods including packaged sweets (pies, cakes, cookies), reduce intake of potatoes, white bread, white pasta, and white rice. Look for whole grain options, oat flour or almond flour. ? ?Each meal should contain half fruits/vegetables, one quarter protein, and one quarter carbs (no bigger than a computer mouse). ? ?Cut down on sweet beverages. This includes juice, soda, and sweet tea. Also watch fruit intake, though this is a healthier sweet option, it still contains natural sugar! Limit to 3 servings daily. ? ?Drink at least 1 glass of water with each meal and aim for at least 8 glasses per day ? ?Exercise at least 150 minutes every week.   ?

## 2021-11-22 NOTE — Progress Notes (Signed)
? ?New Patient Office Visit ? ?Subjective:  ?Patient ID: Mark Bass, male    DOB: 14-Jul-1961  Age: 61 y.o. MRN: [id number] ? ?CC:  ?Chief Complaint  ?Patient presents with  ? Establish Care  ? ? ?HPI ?Mark Bass presents for new pt-interpreter on phone.  There were still some language barriers due to dialect.  Made the visit more complex. ?From [place]-originally per patient, changing doctors. ?Not working currently.  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  Patient feels he is okay to go back to work.  He has forms he has brought with him. ? HTN-patient states he has been taking his medications -which include amlodipine 10 mg hydrochlorothiazide 25 mg losartan 50 mg twice daily and metoprolol 25 mg twice daily.  Home blood pressures have been running 130-150's/91-92.  He has had HTN for 2 yrs.   ?Stroke 2022-had some weakness L side-better now.  He has brought forms with him to be evaluated and signed so he can go back to work.  He states he feels he is strong and able to work.  He can lift things without problems.  Does not feel he has any weakness.  He can walk okay.  He can drive. ?Type 2 diabetes-patient is taking metformin XR 500 mg at breakfast.  In a very brief chart review, he was last seen in March and his A1c was 6.7 ? ? ?Past Medical History:  ?Diagnosis Date  ? 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HCC)   ? Diabetes mellitus without complication (HCC)   ? Hypertension   ? ? ?Past Surgical History:  ?Procedure Laterality Date  ? NECK SURGERY    ? ? ?Family History  ?Family history unknown: Yes  ? ? ?Social History  ? ?Socioeconomic History  ? Marital status: Single  ?  Spouse name: Not on file  ? Number of children: Not on file  ? Years of education: Not on file  ? Highest education level: Not on file  ?Occupational History  ? Not on file  ?Tobacco Use  ? Smoking status: Former  ? Smokeless tobacco: Never  ? Tobacco comments:  ?  Patient smoked in his early 61s.  ?Vaping Use  ? Vaping Use: Never used  ?Substance  and Sexual Activity  ? Alcohol use: Not Currently  ? Drug use: Never  ? Sexual activity: Not Currently  ?Other Topics Concern  ? Not on file  ?Social History Narrative  ? Not on file  ? ?Social Determinants of Health  ? ?Financial Resource Strain: Not on file  ?Food Insecurity: Not on file  ?Transportation Needs: Not on file  ?Physical Activity: Not on file  ?Stress: Not on file  ?Social Connections: Not on file  ?Intimate Partner Violence: Not on file  ? ? ?ROS  ?ROS: ?Gen: no fever, chills  ?Skin: no rash, itching ?ENT: no ear pain, ear drainage, nasal congestion, rhinorrhea, sinus pressure, sore throat ?Eyes: no blurry vision, double vision ?Resp: no cough, wheeze,SOB ?CV: no CP, palpitations, LE edema,  ?GI: no heartburn, n/v/d/c, abd pain ?GU: no dysuria, urgency, frequency, hematuria ?MSK: no joint pain, myalgias, back pain ?Neuro: no dizziness, headache, weakness, vertigo ?Psych: no depression, anxiety, insomnia, SI  ? ?Objective:  ? ?Today's Vitals: BP (!) 160/80   Pulse (!) 113   Temp 98.4 ?F (36.9 ?C) (Temporal)   Ht 5\' 1"  (1.549 m)   Wt 133 lb 8 oz (60.6 kg)   SpO2 97%   BMI 25.22 kg/m?  ? ?Physical  Exam 160/84 L  160/80 R ?Gen: WDWN NAD ?HEENT: NCAT, conjunctiva not injected, sclera nonicteric. EOMI ?TM WNL B, OP moist, no exudates  ?NECK:  supple, no thyromegaly, no nodes, no carotid bruits ?CARDIAC: tachy RRR, S1S2+, no murmur. DP 1+B ?LUNGS: CTAB. No wheezes ?ABDOMEN:  BS+, soft, NTND, No HSM, no masses ?EXT:  no edema ?MSK: no gross abnormalities.  Muscle strength 5/5 all 4 extremities.  He can squat to the floor and get back up, stand on 1 leg, pick up in exam chair without difficulties.   ?NEURO: A&O x3.  CN II-XII intact.  No facial asymmetry ?PSYCH: normal mood. Good eye contact  ? ?Assessment & Plan:  ? ?Problem List Items Addressed This Visit   ?None ? ? ?Outpatient Encounter Medications as of 11/22/2021  ?Medication Sig  ? amLODipine (NORVASC) 10 MG tablet Take 1 tablet (10 mg total) by  mouth daily.  ? aspirin EC 81 MG tablet Take 1 tablet (81 mg total) by mouth daily. Swallow whole.  ? atorvastatin (LIPITOR) 40 MG tablet Take 1 tablet (40 mg total) by mouth daily.  ? hydrochlorothiazide (HYDRODIURIL) 25 MG tablet Take 1 tablet (25 mg total) by mouth daily.  ? losartan (COZAAR) 50 MG tablet Take 50 mg by mouth 2 (two) times daily.  ? metFORMIN (GLUCOPHAGE XR) 500 MG 24 hr tablet Take 1 tablet (500 mg total) by mouth daily with breakfast.  ? metoprolol tartrate (LOPRESSOR) 25 MG tablet Take 1 tablet (25 mg total) by mouth 2 (two) times daily.  ? [DISCONTINUED] cloNIDine (CATAPRES) 0.2 MG tablet Take 1 tablet (0.2 mg total) by mouth 2 (two) times daily.  ? [DISCONTINUED] losartan (COZAAR) 100 MG tablet Take 1 tablet (100 mg total) by mouth 2 (two) times daily.  ? ?No facility-administered encounter medications on file as of 11/22/2021.  ?1.  Hypertension-chronic, not well controlled.  He also has some tachycardia.  States he is not nervous today.  Will increase Lopressor to 50 mg twice daily.  He will monitor his blood pressures.  Let us know if they are too low or he is having any symptoms.  Follow-up in 1 month ?2.  Diabetes type 2-well-controlled.  Currently taking metformin 500 mg once daily. ?3.  History of hemorrhagic stroke-does not appear to have any residual, however there is a vague language barrier.  Patient brought forms to be filled out to return to work.  On today's evaluation, I do not see any reason not to be released back to work, however I do need to review the chart and get better detail.  I conveyed this to a friend that he called and they will discuss with him as well.  I repeatedly let him know that I need to review his chart and cannot fill out the forms today. ? ?I spent 55 minutes working with the patient and interpreter to overcome a lot of language barriers.  I also adjusted medications.  I reviewed his old records.  Still need to fill out the required forms. ? ?Last saw  neuro 09/09/21-improved.  F/u prn. ? ? ? ?Follow-up: No follow-ups on file.  ? ?Angelena Sole, MD ?

## 2021-11-26 ENCOUNTER — Ambulatory Visit (INDEPENDENT_AMBULATORY_CARE_PROVIDER_SITE_OTHER): Payer: 59 | Admitting: Primary Care

## 2021-12-22 ENCOUNTER — Ambulatory Visit (INDEPENDENT_AMBULATORY_CARE_PROVIDER_SITE_OTHER): Payer: 59 | Admitting: Family Medicine

## 2021-12-22 ENCOUNTER — Encounter: Payer: Self-pay | Admitting: Family Medicine

## 2021-12-22 DIAGNOSIS — I619 Nontraumatic intracerebral hemorrhage, unspecified: Secondary | ICD-10-CM | POA: Diagnosis not present

## 2021-12-22 DIAGNOSIS — Z760 Encounter for issue of repeat prescription: Secondary | ICD-10-CM

## 2021-12-22 DIAGNOSIS — I10 Essential (primary) hypertension: Secondary | ICD-10-CM | POA: Diagnosis not present

## 2021-12-22 MED ORDER — ATORVASTATIN CALCIUM 40 MG PO TABS: 40.0000 mg | ORAL_TABLET | Freq: Every day | ORAL | 1 refills | Status: AC

## 2021-12-22 MED ORDER — AMLODIPINE BESYLATE 10 MG PO TABS: 10.0000 mg | ORAL_TABLET | Freq: Every day | ORAL | 1 refills | Status: DC

## 2021-12-22 MED ORDER — HYDROCHLOROTHIAZIDE 25 MG PO TABS: 25.0000 mg | ORAL_TABLET | Freq: Every day | ORAL | 1 refills | Status: DC

## 2021-12-22 NOTE — Patient Instructions (Signed)
It was very nice to see you today! ? ?Good job on blood pressure ? ? ?PLEASE NOTE: ? ?If you had any lab tests please let us know if you have not heard back within a few days. You may see your results on MyChart before we have a chance to review them but we will give you a call once they are reviewed by [place]. If we ordered any referrals today, please let us know if you have not heard from their office within the next week.  ? ?Please try these tips to maintain a healthy lifestyle: ? ?Eat most of your calories during the day when you are active. Eliminate processed foods including packaged sweets (pies, cakes, cookies), reduce intake of potatoes, white bread, white pasta, and white rice. Look for whole grain options, oat flour or almond flour. ? ?Each meal should contain half fruits/vegetables, one quarter protein, and one quarter carbs (no bigger than a computer mouse). ? ?Cut down on sweet beverages. This includes juice, soda, and sweet tea. Also watch fruit intake, though this is a healthier sweet option, it still contains natural sugar! Limit to 3 servings daily. ? ?Drink at least 1 glass of water with each meal and aim for at least 8 glasses per day ? ?Exercise at least 150 minutes every week.   ?

## 2021-12-22 NOTE — Progress Notes (Signed)
? ?Subjective:  ? ? ? Patient ID: Mark Bass, male    DOB: Oct 31, 1960, 61 y.o.   MRN: [id number] ? ?Chief Complaint  ?Patient presents with  ? Follow-up  ?  4 week follow-up on HTN  ? ? ?HPI-here w/interpreter ?[id number].  No ha/dizziness/cough/sob/cp/edema. exercising ? ?Back to work-doing well.  ? ?Health Maintenance Due  ?Topic Date Due  ? COVID-19 Vaccine (1) Never done  ? Hepatitis C Screening  Never done  ? COLONOSCOPY (Pts 45-59yrs Insurance coverage will need to be confirmed)  Never done  ? Zoster Vaccines- Shingrix (1 of 2) Never done  ? TETANUS/TDAP  02/12/2013  ? ? ?Past Medical History:  ?Diagnosis Date  ? 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HCC)   ? Diabetes mellitus without complication (HCC)   ? Hypertension   ? ? ?Past Surgical History:  ?Procedure Laterality Date  ? NECK SURGERY    ? ? ?Outpatient Medications Prior to Visit  ?Medication Sig Dispense Refill  ? aspirin EC 81 MG tablet Take 1 tablet (81 mg total) by mouth daily. Swallow whole. 30 tablet 11  ? losartan (COZAAR) 50 MG tablet Take 50 mg by mouth 2 (two) times daily.    ? metFORMIN (GLUCOPHAGE XR) 500 MG 24 hr tablet Take 1 tablet (500 mg total) by mouth daily with breakfast. 90 tablet 1  ? metoprolol tartrate (LOPRESSOR) 50 MG tablet Take 1 tablet (50 mg total) by mouth 2 (two) times daily. 180 tablet 1  ? amLODipine (NORVASC) 10 MG tablet Take 1 tablet (10 mg total) by mouth daily. 90 tablet 1  ? atorvastatin (LIPITOR) 40 MG tablet Take 1 tablet (40 mg total) by mouth daily. 90 tablet 1  ? hydrochlorothiazide (HYDRODIURIL) 25 MG tablet Take 1 tablet (25 mg total) by mouth daily. 90 tablet 1  ? ?No facility-administered medications prior to visit.  ? ? ?No Known Allergies ?ROS neg/noncontributory except as noted HPI/below ? ? ?   ?Objective:  ?  ? ?BP 140/88   Pulse 76   Temp 98.4 ?F (36.9 ?C) (Temporal)   Ht 5\' 1"  (1.549 m)   Wt 133 lb 2 oz (60.4 kg)   SpO2 98%   BMI 25.15 kg/m?  ?Wt Readings from Last 3 Encounters:  ?12/22/21 133 lb 2 oz  (60.4 kg)  ?11/22/21 133 lb 8 oz (60.6 kg)  ?09/22/21 135 lb (61.2 kg)  ? ? ?Physical Exam  ? ?Gen: WDWN NAD ?HEENT: NCAT, conjunctiva not injected, sclera nonicteric ?NECK:  supple, no thyromegaly, no nodes, no carotid bruits ?CARDIAC: RRR, S1S2+, no murmur. DP 2+B ?LUNGS: CTAB. No wheezes ?EXT:  no edema ?MSK: no gross abnormalities.  ?NEURO: A&O x3.  CN II-XII intact.  ?PSYCH: normal mood. Good eye contact ? ?   ?Assessment & Plan:  ? ?Problem List Items Addressed This Visit   ? ?  ? Cardiovascular and Mediastinum  ? Essential hypertension  ? Relevant Medications  ? hydrochlorothiazide (HYDRODIURIL) 25 MG tablet  ? amLODipine (NORVASC) 10 MG tablet  ? atorvastatin (LIPITOR) 40 MG tablet  ?  ? Nervous and Auditory  ? Stroke, 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HCC)  ? Relevant Medications  ? hydrochlorothiazide (HYDRODIURIL) 25 MG tablet  ? ?Other Visit Diagnoses   ? ? Medication refill      ? Relevant Medications  ? hydrochlorothiazide (HYDRODIURIL) 25 MG tablet  ? amLODipine (NORVASC) 10 MG tablet  ? atorvastatin (LIPITOR) 40 MG tablet  ? ?  ? HTN-chronic/stable.  Much better controlled.  Cont hctz 25mg , amlodipine  10mg  , losartan 100mg , lopressor 50bid.  Monitor bp's.  Refilled meds ?CVA-h/o.  Stable.  Doing well.  Cont ASA, atorvastatin 40mg  ? ?Meds ordered this encounter  ?Medications  ? hydrochlorothiazide (HYDRODIURIL) 25 MG tablet  ?  Sig: Take 1 tablet (25 mg total) by mouth daily.  ?  Dispense:  90 tablet  ?  Refill:  1  ? amLODipine (NORVASC) 10 MG tablet  ?  Sig: Take 1 tablet (10 mg total) by mouth daily.  ?  Dispense:  90 tablet  ?  Refill:  1  ? atorvastatin (LIPITOR) 40 MG tablet  ?  Sig: Take 1 tablet (40 mg total) by mouth daily.  ?  Dispense:  90 tablet  ?  Refill:  1  ? ? ? , MD ? ?

## 2022-01-19 IMAGING — CT CT ANGIO NECK
3 of 7 series · 10 of 36 positions shown · IV contrast (APPLIED)
Comparison: None.

CLINICAL DATA: Follow-up parenchymal hemorrhage

EXAM:
CT ANGIOGRAPHY HEAD AND NECK
TECHNIQUE: Multidetector CT imaging of the head and neck was performed using
the standard protocol during bolus administration of intravenous
contrast. Multiplanar CT image reconstructions and MIPs were
obtained to evaluate the vascular anatomy. Carotid stenosis
measurements (when applicable) are obtained utilizing NASCET
criteria, using the distal internal carotid diameter as the
denominator.
CONTRAST:  75mL OMNIPAQUE IOHEXOL 350 MG/ML SOLN

[Series 5: cta neck/head · axial · 0.43mm/px · z∈[-290,-172]mm · 2 of 179 slices shown]
[im 60/179  soft-tissue]
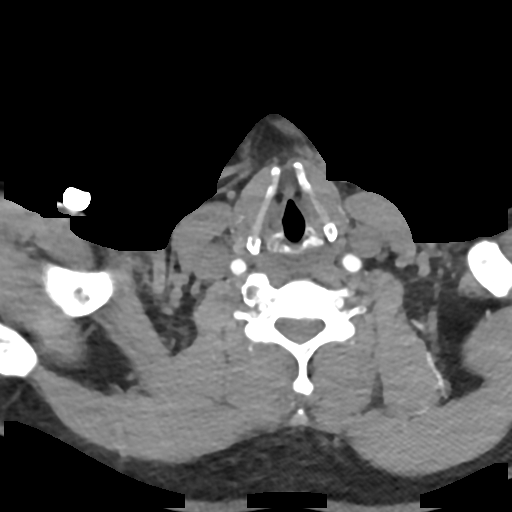
[im 119/179  soft-tissue]
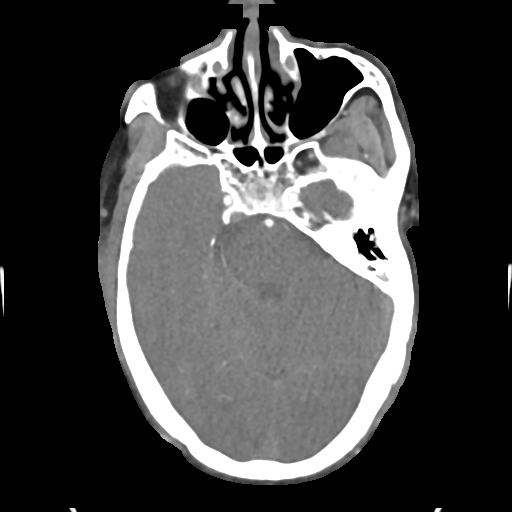

[Series 7: ax thins · axial · 0.38mm/px · z∈[-364,-110]mm · 6 of 359 slices shown]
[im 52/359  soft-tissue]
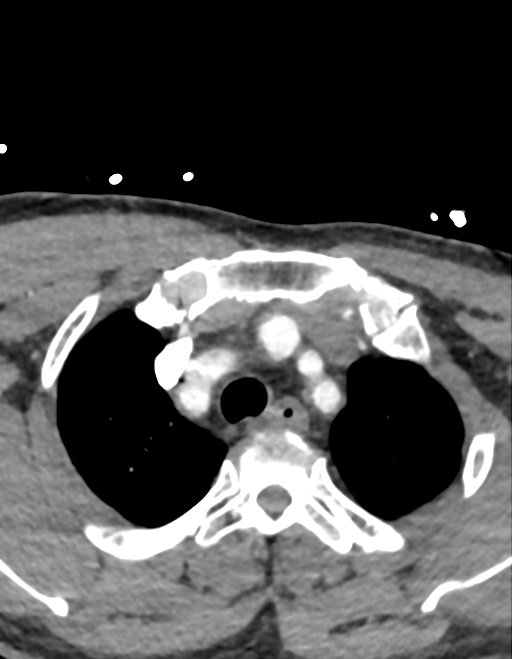
[im 103/359  bone]
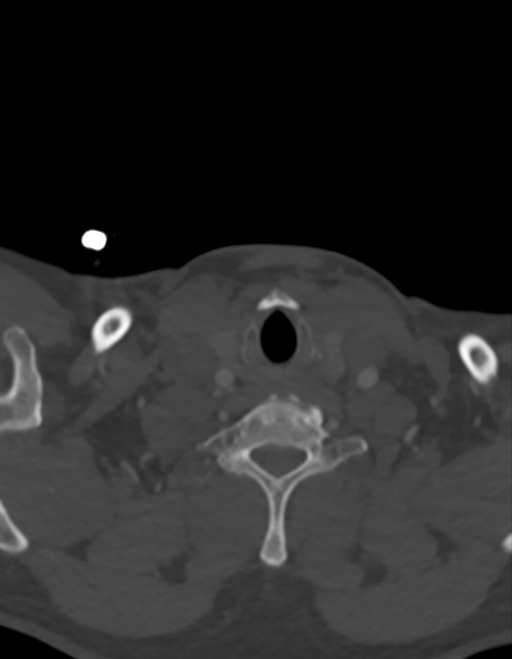
[im 154/359  soft-tissue]
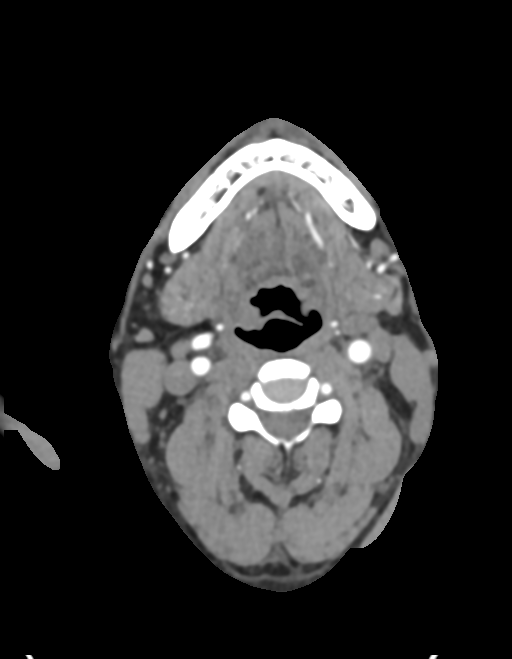
[im 205/359  bone]
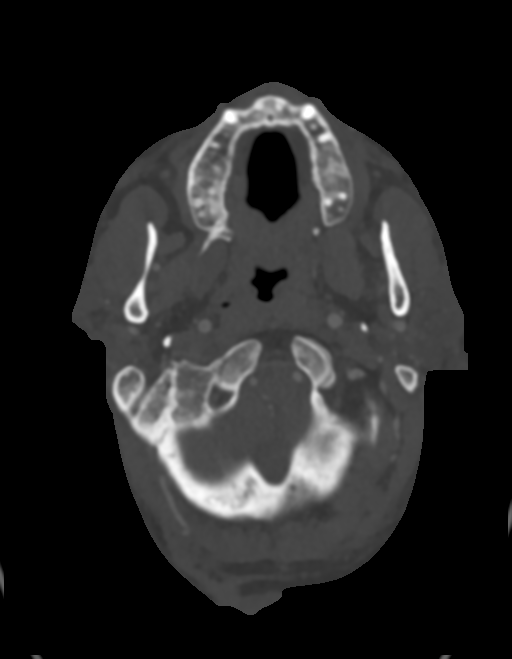
[im 256/359  soft-tissue]
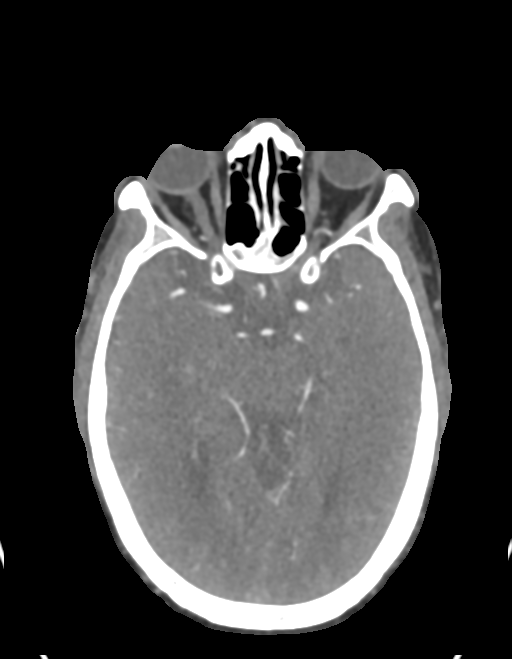
[im 307/359  bone]
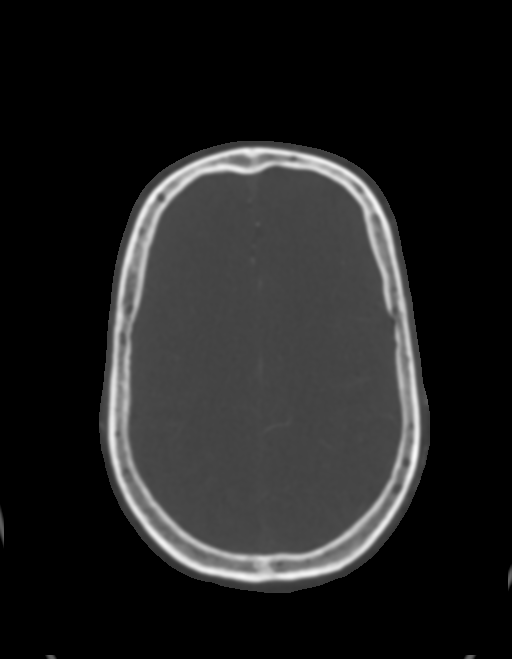

[Series 9: sag thins · sagittal · 0.52mm/px · 2 of 201 slices shown]
[im 53/201  soft-tissue]
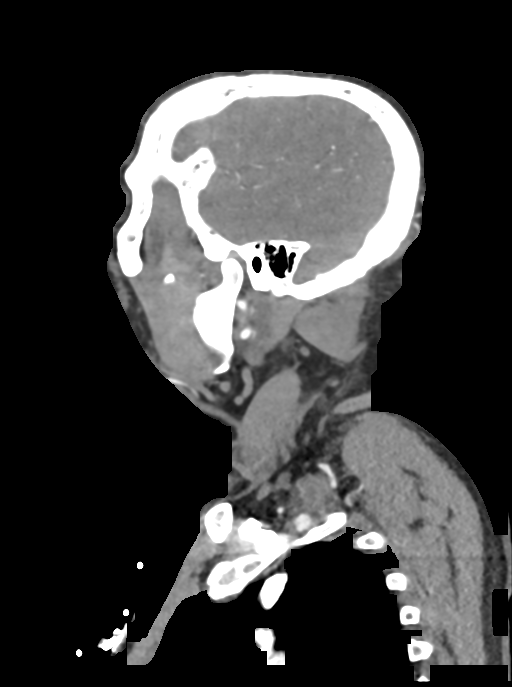
[im 149/201  soft-tissue]
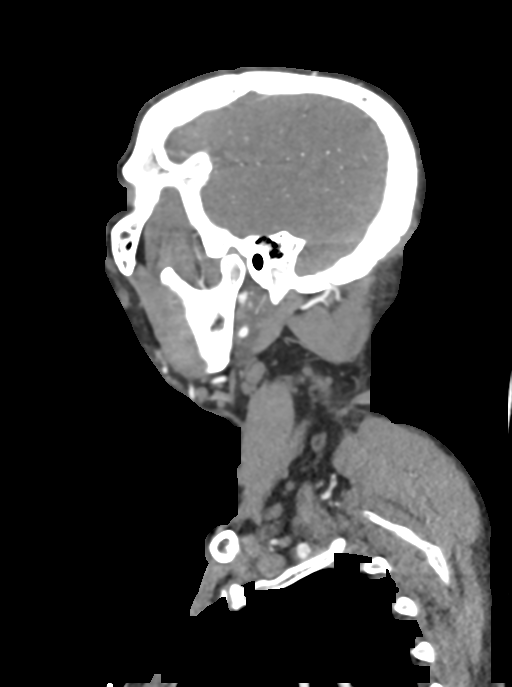

[10 of 36 positions shown; findings below may reference images not displayed]

FINDINGS: CTA NECK FINDINGS

Aortic arch: Normal arch.  Three vessel branching.

Right carotid system: Low-density atheromatous type wall thickening
of the common carotid. No stenosis, ulceration, or beading.

Left carotid system: Low-density atheromatous type wall thickening
of the common carotid. No stenosis, ulceration, or beading.

Vertebral arteries: No proximal subclavian stenosis. Codominant
vertebral arteries which are smooth and widely patent to the dura.

Skeleton: No acute or aggressive finding.

Other neck: Nodular and bandlike scarring in the suboccipital neck.

Upper chest: Negative

Review of the MIP images confirms the above findings

CTA HEAD FINDINGS

Anterior circulation: No proximal occlusion, aneurysm, or vascular
malformation. Widespread atheromatous changes to ACA and MCA
distributions. The worst stenosis is at the right A2/A3 level. Right
MCA stenosis is notable for moderate M1 and M2 narrowings.

Posterior circulation: No proximal occlusion, aneurysm, or vascular
malformation. Moderate atheromatous type narrowing at the right
vertebrobasilar junction and at the mid basilar. Left more than
right PCA atheromatous irregularity and stenosis.

Venous sinuses: Not opacified in the arterial phase

Anatomic variants: None significant.

Other: No spot sign seen at the parenchymal hemorrhage on the right
which has a stable shape.

Review of the MIP images confirms the above findings
IMPRESSION: 1. No spot sign of or visible vascular lesion underlying the
parenchymal hemorrhage.
2. Advanced intracranial atherosclerosis.

## 2022-05-02 ENCOUNTER — Encounter: Payer: Self-pay | Admitting: *Deleted

## 2022-06-22 ENCOUNTER — Other Ambulatory Visit (INDEPENDENT_AMBULATORY_CARE_PROVIDER_SITE_OTHER): Payer: Self-pay | Admitting: Primary Care

## 2022-06-22 NOTE — Telephone Encounter (Signed)
Requested medication (s) are due for refill today: routing for review  Requested medication (s) are on the active medication list: yes  Last refill:  11/22/21  Future visit scheduled: no  Notes to clinic:  Unable to refill per protocol, last refill by another provider. Historical provider.     Requested Prescriptions  Pending Prescriptions Disp Refills   losartan (COZAAR) 50 MG tablet [Pharmacy Med Name: LOSARTAN 50MG  TABLETS] 180 tablet     Sig: TAKE 1 TABLET(50 MG) BY MOUTH TWICE DAILY     Cardiovascular:  Angiotensin Receptor Blockers Failed - 06/22/2022  6:39 AM      Failed - Cr in normal range and within 180 days    Creatinine, Ser  Date Value Ref Range Status  10/20/2021 1.21 0.76 - 1.27 mg/dL Final         Failed - K in normal range and within 180 days    Potassium  Date Value Ref Range Status  10/20/2021 4.3 3.5 - 5.2 mmol/L Final         Failed - Last BP in normal range    BP Readings from Last 1 Encounters:  12/22/21 140/88         Failed - Valid encounter within last 6 months    Recent Outpatient Visits           8 months ago Essential hypertension   [facility] 12/24/21 P, NP   9 months ago Type 2 diabetes mellitus without complication, without long-term current use of insulin (HCC)   [facility] Gwinda Passe, NP   1 year ago Stroke, 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HCC)   [facility], NP   1 year ago Stroke, hemorrhagic [facility])   [facility], NP   1 year ago Hospital discharge follow-up   [facility], NP       Future Appointments             In 2 days Grayce Sessions, MD [facility] [place], [facility]            Passed - Patient is not pregnant

## 2022-06-24 ENCOUNTER — Encounter: Payer: Self-pay | Admitting: Family Medicine

## 2022-06-24 ENCOUNTER — Ambulatory Visit (INDEPENDENT_AMBULATORY_CARE_PROVIDER_SITE_OTHER): Payer: Self-pay | Admitting: Family Medicine

## 2022-06-24 VITALS — BP 176/100 | HR 82 | Temp 98.9°F | Ht 61.0 in | Wt 136.0 lb

## 2022-06-24 DIAGNOSIS — E7800 Pure hypercholesterolemia, unspecified: Secondary | ICD-10-CM

## 2022-06-24 DIAGNOSIS — I619 Nontraumatic intracerebral hemorrhage, unspecified: Secondary | ICD-10-CM

## 2022-06-24 DIAGNOSIS — Z1159 Encounter for screening for other viral diseases: Secondary | ICD-10-CM

## 2022-06-24 DIAGNOSIS — E119 Type 2 diabetes mellitus without complications: Secondary | ICD-10-CM

## 2022-06-24 DIAGNOSIS — Z760 Encounter for issue of repeat prescription: Secondary | ICD-10-CM

## 2022-06-24 DIAGNOSIS — I10 Essential (primary) hypertension: Secondary | ICD-10-CM

## 2022-06-24 LAB — COMPREHENSIVE METABOLIC PANEL
ALT: 11 U/L (ref 0–53)
AST: 14 U/L (ref 0–37)
Albumin: 4.6 g/dL (ref 3.5–5.2)
Alkaline Phosphatase: 60 U/L (ref 39–117)
BUN: 19 mg/dL (ref 6–23)
CO2: 29 mEq/L (ref 19–32)
Calcium: 9.6 mg/dL (ref 8.4–10.5)
Chloride: 100 mEq/L (ref 96–112)
Creatinine, Ser: 1.46 mg/dL (ref 0.40–1.50)
GFR: 50.73 mL/min — ABNORMAL LOW (ref >60.00)
Glucose, Bld: 154 mg/dL — ABNORMAL HIGH (ref 70–99)
Potassium: 3.7 mEq/L (ref 3.5–5.1)
Sodium: 138 mEq/L (ref 135–145)
Total Bilirubin: 0.4 mg/dL (ref 0.2–1.2)
Total Protein: 8.2 g/dL (ref 6.0–8.3)

## 2022-06-24 LAB — TSH: TSH: 2.06 u[IU]/mL (ref 0.35–5.50)

## 2022-06-24 LAB — CBC WITH DIFFERENTIAL/PLATELET
Basophils Absolute: 0 10*3/uL (ref 0.0–0.1)
Basophils Relative: 0.4 % (ref 0.0–3.0)
Eosinophils Absolute: 0.8 10*3/uL — ABNORMAL HIGH (ref 0.0–0.7)
Eosinophils Relative: 7.3 % — ABNORMAL HIGH (ref 0.0–5.0)
HCT: 44 % (ref 39.0–52.0)
Hemoglobin: 13.9 g/dL (ref 13.0–17.0)
Lymphocytes Relative: 31.9 % (ref 12.0–46.0)
Lymphs Abs: 3.5 10*3/uL (ref 0.7–4.0)
MCHC: 31.6 g/dL (ref 30.0–36.0)
MCV: 68 fl — ABNORMAL LOW (ref 78.0–100.0)
Monocytes Absolute: 0.9 10*3/uL (ref 0.1–1.0)
Monocytes Relative: 8.3 % (ref 3.0–12.0)
Neutro Abs: 5.7 10*3/uL (ref 1.4–7.7)
Neutrophils Relative %: 52.1 % (ref 43.0–77.0)
Platelets: 219 10*3/uL (ref 150.0–400.0)
RBC: 6.47 Mil/uL — ABNORMAL HIGH (ref 4.22–5.81)
RDW: 16.2 % — ABNORMAL HIGH (ref 11.5–15.5)
WBC: 10.9 10*3/uL — ABNORMAL HIGH (ref 4.0–10.5)

## 2022-06-24 LAB — LDL CHOLESTEROL, DIRECT: Direct LDL: 120 mg/dL

## 2022-06-24 LAB — LIPID PANEL
Cholesterol: 212 mg/dL — ABNORMAL HIGH (ref 0–200)
HDL: 52.7 mg/dL (ref >39.00)
NonHDL: 159.27
Total CHOL/HDL Ratio: 4
Triglycerides: 329 mg/dL — ABNORMAL HIGH (ref 0.0–149.0)
VLDL: 65.8 mg/dL — ABNORMAL HIGH (ref 0.0–40.0)

## 2022-06-24 LAB — MICROALBUMIN / CREATININE URINE RATIO
Creatinine,U: 35.1 mg/dL
Microalb Creat Ratio: 2.1 mg/g (ref 0.0–30.0)
Microalb, Ur: 0.7 mg/dL (ref 0.0–1.9)

## 2022-06-24 LAB — HEMOGLOBIN A1C: Hgb A1c MFr Bld: 7.1 % — ABNORMAL HIGH (ref 4.6–6.5)

## 2022-06-24 MED ORDER — HYDROCHLOROTHIAZIDE 25 MG PO TABS: 25.0000 mg | ORAL_TABLET | Freq: Every day | ORAL | 1 refills | Status: DC

## 2022-06-24 MED ORDER — AMLODIPINE BESYLATE 10 MG PO TABS: 10.0000 mg | ORAL_TABLET | Freq: Every day | ORAL | 1 refills | Status: DC

## 2022-06-24 NOTE — Progress Notes (Signed)
Subjective:     Patient ID: Mark Bass, male    DOB: June 11, 1961, 61 y.o.   MRN: [id number]  Chief Complaint  Patient presents with   Follow-up    6 month follow-up for HTN and dm     HPI-here w/interpreter  HTN-Pt is supposed to be on metoprolol 50, losartan 50mg  bid, amlodipine 10, hctz 25mg . Pt only taking 2 meds-amlodipine and hctz.  Afraid "too much meds". Bp's running 140/80 this am usu 135-140/80.  No ha/dizziness/cp/palp/edema/cough/sob DM-supposed to be on metformin xr 500mg  daily - "don't have it" H/o hemorrhagic cva-on asa 81mg  and lipitor 40mg ..  L leg chronically numb.  HLD-on lipitor 40mg -but not taking  Health Maintenance Due  Topic Date Due   FOOT EXAM  Never done   OPHTHALMOLOGY EXAM  Never done   Diabetic kidney evaluation - Urine ACR  Never done   Hepatitis C Screening  Never done   COLONOSCOPY (Pts 45-53yrs Insurance coverage will need to be confirmed)  Never done   Zoster Vaccines- Shingrix (1 of 2) Never done   HEMOGLOBIN A1C  03/22/2022    Past Medical History:  Diagnosis Date   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HCC)    Diabetes mellitus without complication (HCC)    Hypertension     Past Surgical History:  Procedure Laterality Date   NECK SURGERY      Outpatient Medications Prior to Visit  Medication Sig Dispense Refill   amLODipine (NORVASC) 10 MG tablet Take 1 tablet (10 mg total) by mouth daily. 90 tablet 1   aspirin EC 81 MG tablet Take 1 tablet (81 mg total) by mouth daily. Swallow whole. 30 tablet 11   atorvastatin (LIPITOR) 40 MG tablet Take 1 tablet (40 mg total) by mouth daily. 90 tablet 1   hydrochlorothiazide (HYDRODIURIL) 25 MG tablet Take 1 tablet (25 mg total) by mouth daily. 90 tablet 1   losartan (COZAAR) 50 MG tablet Take 50 mg by mouth 2 (two) times daily.     metoprolol tartrate (LOPRESSOR) 50 MG tablet Take 1 tablet (50 mg total) by mouth 2 (two) times daily. 180 tablet 1   metFORMIN (GLUCOPHAGE XR) 500 MG 24 hr tablet Take 1 tablet  (500 mg total) by mouth daily with breakfast. (Patient not taking: Reported on 06/24/2022) 90 tablet 1   No facility-administered medications prior to visit.    No Known Allergies ROS neg/noncontributory except as noted HPI/below      Objective:     BP (!) 176/100 (BP Location: Left Arm, Patient Position: Sitting, Cuff Size: Normal)   Pulse 82   Temp 98.9 F (37.2 C) (Temporal)   Ht 5\' 1"  (1.549 m)   Wt 136 lb (61.7 kg)   SpO2 96%   BMI 25.70 kg/m  Wt Readings from Last 3 Encounters:  06/24/22 136 lb (61.7 kg)  12/22/21 133 lb 2 oz (60.4 kg)  11/22/21 133 lb 8 oz (60.6 kg)    Physical Exam   Gen: WDWN NAD  170/100-2 different cuffs.  HEENT: NCAT, conjunctiva not injected, sclera nonicteric NECK:  supple, no thyromegaly, no nodes, no carotid bruits CARDIAC: RRR, S1S2+, no murmur. DP 2+B LUNGS: CTAB. No wheezes ABDOMEN:  BS+, soft, NTND, No HSM, no masses EXT:  no edema MSK: no gross abnormalities.  NEURO: A&O x3.  CN II-XII intact.  PSYCH: normal mood. Good eye contact  Diabetic Foot Exam - Simple   Simple Foot Form Diabetic Foot exam was performed with the following findings: Yes 06/24/2022  1:23 PM  Visual Inspection See comments: Yes Sensation Testing Intact to touch and monofilament testing bilaterally: Yes Pulse Check Posterior Tibialis and Dorsalis pulse intact bilaterally: Yes Comments Fungal nails         Assessment & Plan:   Problem List Items Addressed This Visit       Cardiovascular and Mediastinum   Essential hypertension - Primary   Relevant Orders   Comprehensive metabolic panel   CBC with Differential/Platelet   Microalbumin / creatinine urine ratio     Endocrine   Type 2 diabetes mellitus without complication, without long-term current use of insulin (HCC)   Relevant Orders   Comprehensive metabolic panel   Hemoglobin A1c   TSH   Microalbumin / creatinine urine ratio     Nervous and Auditory   Stroke, 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HCC)      Other   Pure hypercholesterolemia   Relevant Orders   Comprehensive metabolic panel   Lipid panel   Other Visit Diagnoses     Encounter for hepatitis C screening test for low risk patient       Relevant Orders   Hepatitis C antibody      HTN-chronic.  Not controlled.  Pt not taking all of his meds as thought too many.  Home cuff <140/90 which is way different than here.  ?white coat, ?bad cuff.  Continue amlodipine 10mg , hctz 25mg .  Restart losartan 50mg  once/day.  F/u 2 wks and bring cuff.  Check cmp,cbc HLD-chronic.  Not taking atorvastatin-restart 40mg  and check lipids DM-pt not taking metformin.   Will check labs-A1C,cmp,tsh,urine microalb/creat ratio.  May need to restart H/o CVA-from HTN-.  Cont ASA 81mg .  Doing ok.  Little residual.  Advised needs to get bp controlled!!! F/u 2 wks.   No orders of the defined types were placed in this encounter.   , MD

## 2022-06-24 NOTE — Patient Instructions (Addendum)
It was very nice to see you today!  Take hydrochlorothiazide 25mg , amlodipine 10mg , atorvastatin 40mg  and losartan 50mg  once/day and aspirin   PLEASE NOTE:  If you had any lab tests please let know if you have not heard back within a few days. You may see your results on MyChart before we have a chance to review them but we will give you a call once they are reviewed by . If we ordered any referrals today, please let know if you have not heard from their office within the next week.   Please try these tips to maintain a healthy lifestyle:  Eat most of your calories during the day when you are active. Eliminate processed foods including packaged sweets (pies, cakes, cookies), reduce intake of potatoes, white bread, white pasta, and white rice. Look for whole grain options, oat flour or almond flour.  Each meal should contain half fruits/vegetables, one quarter protein, and one quarter carbs (no bigger than a computer mouse).  Cut down on sweet beverages. This includes juice, soda, and sweet tea. Also watch fruit intake, though this is a healthier sweet option, it still contains natural sugar! Limit to 3 servings daily.  Drink at least 1 glass of water with each meal and aim for at least 8 glasses per day  Exercise at least 150 minutes every week.

## 2022-06-26 NOTE — Progress Notes (Signed)
1.  Has diabetes and sugars are not optimal.  Restart metformin 500 mg daily-please send to pharmacy. 2.  Cholesterol is high-but he was not taking the atorvastatin.  Please restart taking it as we discussed at the visit

## 2022-06-28 MED ORDER — METFORMIN HCL 500 MG PO TABS: 500.0000 mg | ORAL_TABLET | Freq: Two times a day (BID) | ORAL | 3 refills | Status: AC

## 2022-06-28 NOTE — Addendum Note (Signed)
Addended by: Leda Quail on: 06/28/2022 04:24 PM   Modules accepted: Orders

## 2022-07-08 ENCOUNTER — Encounter: Payer: Self-pay | Admitting: Family Medicine

## 2022-07-08 ENCOUNTER — Ambulatory Visit (INDEPENDENT_AMBULATORY_CARE_PROVIDER_SITE_OTHER): Payer: Self-pay | Admitting: Family Medicine

## 2022-07-08 DIAGNOSIS — I10 Essential (primary) hypertension: Secondary | ICD-10-CM

## 2022-07-08 DIAGNOSIS — Z760 Encounter for issue of repeat prescription: Secondary | ICD-10-CM

## 2022-07-08 MED ORDER — AMLODIPINE BESYLATE 10 MG PO TABS: 10.0000 mg | ORAL_TABLET | Freq: Every day | ORAL | 1 refills | Status: DC

## 2022-07-08 NOTE — Patient Instructions (Signed)
It was very nice to see you today!  Same meds.  Continue metformin   PLEASE NOTE:  If you had any lab tests please let us know if you have not heard back within a few days. You may see your results on MyChart before we have a chance to review them but we will give you a call once they are reviewed by [place]. If we ordered any referrals today, please let us know if you have not heard from their office within the next week.   Please try these tips to maintain a healthy lifestyle:  Eat most of your calories during the day when you are active. Eliminate processed foods including packaged sweets (pies, cakes, cookies), reduce intake of potatoes, white bread, white pasta, and white rice. Look for whole grain options, oat flour or almond flour.  Each meal should contain half fruits/vegetables, one quarter protein, and one quarter carbs (no bigger than a computer mouse).  Cut down on sweet beverages. This includes juice, soda, and sweet tea. Also watch fruit intake, though this is a healthier sweet option, it still contains natural sugar! Limit to 3 servings daily.  Drink at least 1 glass of water with each meal and aim for at least 8 glasses per day  Exercise at least 150 minutes every week.

## 2022-07-08 NOTE — Progress Notes (Signed)
Subjective:     Patient ID: Mark Bass, male    DOB: 18-Sep-1960, 61 y.o.   MRN: [id number]  Chief Complaint  Patient presents with   Hypertension    HPI-interpreter on phone.  F/u HTN-140/88 with home monitor. Taking meds.   DM-taking metformin daily again  Health Maintenance Due  Topic Date Due   OPHTHALMOLOGY EXAM  Never done   Hepatitis C Screening  Never done   COLONOSCOPY (Pts 45-67yrs Insurance coverage will need to be confirmed)  Never done   Zoster Vaccines- Shingrix (1 of 2) Never done   DTaP/Tdap/Td (4 - Tdap) 02/12/2013    Past Medical History:  Diagnosis Date   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HCC)    Diabetes mellitus without complication (HCC)    Hypertension     Past Surgical History:  Procedure Laterality Date   NECK SURGERY      Outpatient Medications Prior to Visit  Medication Sig Dispense Refill   aspirin EC 81 MG tablet Take 1 tablet (81 mg total) by mouth daily. Swallow whole. 30 tablet 11   atorvastatin (LIPITOR) 40 MG tablet Take 1 tablet (40 mg total) by mouth daily. 90 tablet 1   hydrochlorothiazide (HYDRODIURIL) 25 MG tablet Take 1 tablet (25 mg total) by mouth daily. 90 tablet 1   losartan (COZAAR) 50 MG tablet Take 50 mg by mouth 2 (two) times daily.     metFORMIN (GLUCOPHAGE) 500 MG tablet Take 1 tablet (500 mg total) by mouth 2 (two) times daily with a meal. 180 tablet 3   metoprolol tartrate (LOPRESSOR) 50 MG tablet Take 1 tablet (50 mg total) by mouth 2 (two) times daily. 180 tablet 1   amLODipine (NORVASC) 10 MG tablet Take 1 tablet (10 mg total) by mouth daily. 90 tablet 1   metFORMIN (GLUCOPHAGE XR) 500 MG 24 hr tablet Take 1 tablet (500 mg total) by mouth daily with breakfast. (Patient not taking: Reported on 06/24/2022) 90 tablet 1   No facility-administered medications prior to visit.    No Known Allergies ROS neg/noncontributory except as noted HPI/below      Objective:     BP 132/78 (BP Location: Left Arm, Patient Position:  Sitting, Cuff Size: Normal)   Pulse 67   Temp 98.7 F (37.1 C) (Temporal)   Ht 5\' 1"  (1.549 m)   Wt 133 lb 12.8 oz (60.7 kg)   SpO2 98%   BMI 25.28 kg/m  Wt Readings from Last 3 Encounters:  07/08/22 133 lb 12.8 oz (60.7 kg)  06/24/22 136 lb (61.7 kg)  12/22/21 133 lb 2 oz (60.4 kg)    Physical Exam   Gen: WDWN NAD HEENT: NCAT, conjunctiva not injected, sclera nonicteric EXT:  no edema MSK: no gross abnormalities.  NEURO: A&O x3.  CN II-XII intact.  PSYCH: normal mood. Good eye contact     Assessment & Plan:   Problem List Items Addressed This Visit       Cardiovascular and Mediastinum   Essential hypertension   Relevant Medications   amLODipine (NORVASC) 10 MG tablet   Other Visit Diagnoses     Medication refill       Relevant Medications   amLODipine (NORVASC) 10 MG tablet      HTN-chronic.  Finally controlled-first bp here high and pt cuff read 172/101.  Then, I recked w/cuff on each arm and 130's/80's so used another cuff and same and then pt cufff and read 142/88-so, I feel his cuff is accurate.  Continue  amlodipine 10mg -renewed, losartan 50mg , hctc 25mg .  F/u  3 mo. Continue to monitor DM-back on metformin.   Meds ordered this encounter  Medications   amLODipine (NORVASC) 10 MG tablet    Sig: Take 1 tablet (10 mg total) by mouth daily.    Dispense:  90 tablet    Refill:  1    , MD

## 2022-08-04 ENCOUNTER — Other Ambulatory Visit (INDEPENDENT_AMBULATORY_CARE_PROVIDER_SITE_OTHER): Payer: Self-pay | Admitting: Primary Care

## 2022-08-04 DIAGNOSIS — Z760 Encounter for issue of repeat prescription: Secondary | ICD-10-CM

## 2022-08-04 DIAGNOSIS — I619 Nontraumatic intracerebral hemorrhage, unspecified: Secondary | ICD-10-CM

## 2022-08-04 DIAGNOSIS — I10 Essential (primary) hypertension: Secondary | ICD-10-CM

## 2022-12-16 ENCOUNTER — Other Ambulatory Visit: Payer: Self-pay | Admitting: Family Medicine

## 2022-12-16 ENCOUNTER — Other Ambulatory Visit (INDEPENDENT_AMBULATORY_CARE_PROVIDER_SITE_OTHER): Payer: Self-pay | Admitting: Primary Care

## 2022-12-16 DIAGNOSIS — I10 Essential (primary) hypertension: Secondary | ICD-10-CM

## 2022-12-16 DIAGNOSIS — Z760 Encounter for issue of repeat prescription: Secondary | ICD-10-CM

## 2022-12-16 NOTE — Telephone Encounter (Signed)
No longer patient at office Requested Prescriptions  Pending Prescriptions Disp Refills   losartan (COZAAR) 50 MG tablet [Pharmacy Med Name: LOSARTAN 50MG  TABLETS] 180 tablet     Sig: TAKE 1 TABLET(50 MG) BY MOUTH TWICE DAILY     Cardiovascular:  Angiotensin Receptor Blockers Failed - 12/16/2022 12:17 PM      Failed - Valid encounter within last 6 months    Recent Outpatient Visits           1 year ago Essential hypertension   [place] [facility] Grayce Sessions, NP   1 year ago Type 2 diabetes mellitus without complication, without long-term current use of insulin (HCC)   [place] [facility] Grayce Sessions, NP   1 year ago Stroke, hemorrhagic [facility])   [place] [facility] Grayce Sessions, NP   1 year ago Stroke, hemorrhagic [facility])   [place] [facility] Grayce Sessions, NP   2 years ago Hospital discharge follow-up   [place] [facility] Grayce Sessions, NP              Passed - Cr in normal range and within 180 days    Creatinine, Ser  Date Value Ref Range Status  06/24/2022 1.46 0.40 - 1.50 mg/dL Final   Creatinine,U  Date Value Ref Range Status  06/24/2022 35.1 mg/dL Final         Passed - K in normal range and within 180 days    Potassium  Date Value Ref Range Status  06/24/2022 3.7 3.5 - 5.1 mEq/L Final         Passed - Patient is not pregnant      Passed - Last BP in normal range    BP Readings from Last 1 Encounters:  07/08/22 132/78

## 2023-03-15 NOTE — Congregational Nurse Program (Signed)
CN office visit with interpreter Diu Hartshorn assisting.  Patient brought letter from [facility] regarding Medicare and Social Security and requested help in understanding it.  Patient not sure when PCP follow-up is scheduled.  CN called and made appointment with [facility] on [address] For 04/11/2023 @ 9:00 am.  Patient states he takes medication regularly and checks BP at home.  Brantley Fling RN, Congregational Nurse [phone]

## 2023-03-23 ENCOUNTER — Encounter (INDEPENDENT_AMBULATORY_CARE_PROVIDER_SITE_OTHER): Payer: Self-pay

## 2023-03-27 ENCOUNTER — Ambulatory Visit: Payer: Self-pay | Admitting: Family Medicine

## 2023-04-11 ENCOUNTER — Ambulatory Visit: Payer: Self-pay | Admitting: Family Medicine

## 2023-08-03 ENCOUNTER — Other Ambulatory Visit (INDEPENDENT_AMBULATORY_CARE_PROVIDER_SITE_OTHER): Payer: Self-pay | Admitting: Primary Care

## 2023-08-03 DIAGNOSIS — I10 Essential (primary) hypertension: Secondary | ICD-10-CM

## 2023-08-03 DIAGNOSIS — Z760 Encounter for issue of repeat prescription: Secondary | ICD-10-CM

## 2023-08-06 ENCOUNTER — Other Ambulatory Visit (INDEPENDENT_AMBULATORY_CARE_PROVIDER_SITE_OTHER): Payer: Self-pay | Admitting: Family Medicine

## 2023-08-06 DIAGNOSIS — Z760 Encounter for issue of repeat prescription: Secondary | ICD-10-CM

## 2023-08-06 DIAGNOSIS — I619 Nontraumatic intracerebral hemorrhage, unspecified: Secondary | ICD-10-CM

## 2023-08-06 DIAGNOSIS — I10 Essential (primary) hypertension: Secondary | ICD-10-CM

## 2023-08-06 NOTE — Telephone Encounter (Signed)
Needs appt

## 2023-11-22 NOTE — Congregational Nurse Program (Signed)
 Patient came to CN office requesting help in applying for Medicaid for his wife.  [employer] intern [name] met with him but was unable to complete application because he did not have all the necessary information.  He will return next week.  Raeford Bullion RN, Congregational Nurse [phone]

## 2023-12-06 NOTE — Congregational Nurse Program (Signed)
 CN office visit with interpreter Diu Hartshorn assisting.  Patient came to complete Medicaid application for his wife.  Stated he did not take his medication this morning.  BP check 176/90.  Stated he will take meds as soon as he gets home and that he has follow-up appointment with PCP on May 12,2025.  Raeford Bullion RN, Congregational Nurse [phone]
